# Patient Record
Sex: Male | Born: 1935 | Race: White | Hispanic: No | Marital: Married | Smoking: Never smoker
Health system: Southern US, Community
[De-identification: ages and names within clinical notes are randomized; demographics above are authoritative.]

## PROBLEM LIST (undated history)

## (undated) DIAGNOSIS — G609 Hereditary and idiopathic neuropathy, unspecified: Secondary | ICD-10-CM

## (undated) DIAGNOSIS — R918 Other nonspecific abnormal finding of lung field: Secondary | ICD-10-CM

## (undated) DIAGNOSIS — I10 Essential (primary) hypertension: Secondary | ICD-10-CM

## (undated) DIAGNOSIS — N184 Chronic kidney disease, stage 4 (severe): Secondary | ICD-10-CM

## (undated) DIAGNOSIS — I639 Cerebral infarction, unspecified: Secondary | ICD-10-CM

## (undated) DIAGNOSIS — K7460 Unspecified cirrhosis of liver: Secondary | ICD-10-CM

## (undated) DIAGNOSIS — I350 Nonrheumatic aortic (valve) stenosis: Secondary | ICD-10-CM

## (undated) DIAGNOSIS — I2510 Atherosclerotic heart disease of native coronary artery without angina pectoris: Secondary | ICD-10-CM

---

## 2011-02-06 LAB — CBC
HCT: 27.4 % — ABNORMAL LOW (ref 40.0–52.0)
MCHC: 31.4 g/dL — ABNORMAL LOW (ref 32.0–36.0)
MCV: 71 fL — ABNORMAL LOW (ref 80–100)
Platelet: 318 10*3/uL (ref 150–440)
RDW: 18.1 % — ABNORMAL HIGH (ref 11.5–14.5)
WBC: 6.9 10*3/uL (ref 3.8–10.6)

## 2011-02-06 LAB — COMPREHENSIVE METABOLIC PANEL
Albumin: 3.9 g/dL (ref 3.4–5.0)
Alkaline Phosphatase: 86 U/L (ref 50–136)
Bilirubin,Total: 0.3 mg/dL (ref 0.2–1.0)
Calcium, Total: 9 mg/dL (ref 8.5–10.1)
Co2: 25 mmol/L (ref 21–32)
Creatinine: 2.35 mg/dL — ABNORMAL HIGH (ref 0.60–1.30)
EGFR (Non-African Amer.): 29 — ABNORMAL LOW
Glucose: 122 mg/dL — ABNORMAL HIGH (ref 65–99)
SGPT (ALT): 24 U/L

## 2011-02-06 LAB — TROPONIN I: Troponin-I: 0.28 ng/mL — ABNORMAL HIGH

## 2011-02-07 ENCOUNTER — Observation Stay: Payer: Self-pay | Admitting: Internal Medicine

## 2011-02-07 LAB — RETICULOCYTES
Absolute Retic Count: 0.136 x10 6/uL — ABNORMAL HIGH
Reticulocyte: 3.9 % — ABNORMAL HIGH

## 2011-02-07 LAB — CK TOTAL AND CKMB (NOT AT ARMC)
CK, Total: 81 U/L (ref 35–232)
CK, Total: 101 U/L (ref 35–232)
CK-MB: 2.5 ng/mL (ref 0.5–3.6)
CK-MB: 2.2 ng/mL (ref 0.5–3.6)
CK-MB: 2.7 ng/mL (ref 0.5–3.6)

## 2011-02-07 LAB — TROPONIN I: Troponin-I: 0.28 ng/mL — ABNORMAL HIGH

## 2011-02-07 LAB — PRO B NATRIURETIC PEPTIDE: B-Type Natriuretic Peptide: 2205 pg/mL — ABNORMAL HIGH (ref 0–125)

## 2011-02-07 LAB — FERRITIN: Ferritin (ARMC): <1 ng/mL — ABNORMAL LOW (ref 8–388)

## 2011-02-07 LAB — FOLATE: Folic Acid: 22.8 ng/mL (ref 3.1–100.0)

## 2011-02-08 LAB — BASIC METABOLIC PANEL
BUN: 50 mg/dL — ABNORMAL HIGH (ref 7–18)
Calcium, Total: 8.6 mg/dL (ref 8.5–10.1)
Chloride: 106 mmol/L (ref 98–107)
Creatinine: 3.08 mg/dL — ABNORMAL HIGH (ref 0.60–1.30)
EGFR (Non-African Amer.): 21 — ABNORMAL LOW
Glucose: 99 mg/dL (ref 65–99)
Osmolality: 295 (ref 275–301)

## 2011-02-08 LAB — HEMOGLOBIN A1C: Hemoglobin A1C: 5.3 % (ref 4.2–6.3)

## 2011-02-08 LAB — PROTIME-INR
INR: 1.1
Prothrombin Time: 14.8 secs — ABNORMAL HIGH (ref 11.5–14.7)

## 2011-02-08 LAB — TSH: Thyroid Stimulating Horm: 0.575 u[IU]/mL

## 2011-02-08 LAB — CBC WITH DIFFERENTIAL/PLATELET
Basophil #: 0 10*3/uL (ref 0.0–0.1)
Basophil %: 0.3 %
Eosinophil #: 0.2 10*3/uL (ref 0.0–0.7)
Eosinophil %: 2.3 %
HCT: 23.7 % — ABNORMAL LOW (ref 40.0–52.0)
HGB: 7.5 g/dL — ABNORMAL LOW (ref 13.0–18.0)
Lymphocyte %: 20.7 %
MCH: 22.5 pg — ABNORMAL LOW (ref 26.0–34.0)
MCHC: 31.9 g/dL — ABNORMAL LOW (ref 32.0–36.0)
Neutrophil #: 5.4 10*3/uL (ref 1.4–6.5)
Neutrophil %: 65.6 %
RDW: 17.9 % — ABNORMAL HIGH (ref 11.5–14.5)
WBC: 8.2 10*3/uL (ref 3.8–10.6)

## 2011-02-08 LAB — LIPID PANEL
Ldl Cholesterol, Calc: 87 mg/dL (ref 0–100)
Triglycerides: 130 mg/dL (ref 0–200)

## 2011-02-09 LAB — URINALYSIS, COMPLETE
Bilirubin,UR: NEGATIVE
Blood: NEGATIVE
Glucose,UR: NEGATIVE mg/dL (ref 0–75)
Leukocyte Esterase: NEGATIVE
RBC,UR: <1 /HPF (ref 0–5)
Specific Gravity: 1.012 (ref 1.003–1.030)
WBC UR: 1 /HPF (ref 0–5)

## 2011-02-09 LAB — BASIC METABOLIC PANEL
Anion Gap: 11 (ref 7–16)
BUN: 44 mg/dL — ABNORMAL HIGH (ref 7–18)
Chloride: 108 mmol/L — ABNORMAL HIGH (ref 98–107)
Co2: 23 mmol/L (ref 21–32)
Creatinine: 2.72 mg/dL — ABNORMAL HIGH (ref 0.60–1.30)
EGFR (African American): 30 — ABNORMAL LOW
Glucose: 102 mg/dL — ABNORMAL HIGH (ref 65–99)
Osmolality: 295 (ref 275–301)
Sodium: 142 mmol/L (ref 136–145)

## 2011-02-09 LAB — CBC WITH DIFFERENTIAL/PLATELET
Eosinophil %: 4.3 %
HGB: 8.2 g/dL — ABNORMAL LOW (ref 13.0–18.0)
Lymphocyte #: 2 10*3/uL (ref 1.0–3.6)
Lymphocyte %: 29.4 %
MCH: 22.5 pg — ABNORMAL LOW (ref 26.0–34.0)
MCHC: 31.7 g/dL — ABNORMAL LOW (ref 32.0–36.0)
MCV: 71 fL — ABNORMAL LOW (ref 80–100)
Monocyte %: 10.7 %
Platelet: 242 10*3/uL (ref 150–440)

## 2011-02-09 LAB — PROTEIN / CREATININE RATIO, URINE
Creatinine, Urine: 70.7 mg/dL (ref 30.0–125.0)
Protein, Random Urine: 29 mg/dL — ABNORMAL HIGH (ref 0–12)
Protein/Creat. Ratio: 410 mg/gCREAT — ABNORMAL HIGH (ref 0–200)

## 2013-09-30 ENCOUNTER — Emergency Department: Payer: Self-pay | Admitting: Emergency Medicine

## 2013-09-30 LAB — HEPATIC FUNCTION PANEL A (ARMC)
Albumin: 3.1 g/dL — ABNORMAL LOW (ref 3.4–5.0)
Alkaline Phosphatase: 94 U/L
Bilirubin,Total: 0.4 mg/dL (ref 0.2–1.0)
SGOT(AST): 13 U/L — ABNORMAL LOW (ref 15–37)
SGPT (ALT): 18 U/L
TOTAL PROTEIN: 6.9 g/dL (ref 6.4–8.2)

## 2013-09-30 LAB — BASIC METABOLIC PANEL
ANION GAP: 4 — AB (ref 7–16)
BUN: 45 mg/dL — AB (ref 7–18)
CALCIUM: 8.5 mg/dL (ref 8.5–10.1)
Chloride: 116 mmol/L — ABNORMAL HIGH (ref 98–107)
Co2: 20 mmol/L — ABNORMAL LOW (ref 21–32)
Creatinine: 2.91 mg/dL — ABNORMAL HIGH (ref 0.60–1.30)
EGFR (African American): 23 — ABNORMAL LOW
EGFR (Non-African Amer.): 20 — ABNORMAL LOW
GLUCOSE: 96 mg/dL (ref 65–99)
Osmolality: 291 (ref 275–301)
Potassium: 4.8 mmol/L (ref 3.5–5.1)
Sodium: 140 mmol/L (ref 136–145)

## 2013-09-30 LAB — CBC WITH DIFFERENTIAL/PLATELET
BASOS ABS: 0.1 10*3/uL (ref 0.0–0.1)
Basophil %: 0.6 %
EOS ABS: 0.3 10*3/uL (ref 0.0–0.7)
Eosinophil %: 2.7 %
HCT: 38.7 % — ABNORMAL LOW (ref 40.0–52.0)
HGB: 12.2 g/dL — ABNORMAL LOW (ref 13.0–18.0)
Lymphocyte #: 1.6 10*3/uL (ref 1.0–3.6)
Lymphocyte %: 16.6 %
MCH: 25.3 pg — AB (ref 26.0–34.0)
MCHC: 31.5 g/dL — ABNORMAL LOW (ref 32.0–36.0)
MCV: 80 fL (ref 80–100)
Monocyte #: 0.8 x10 3/mm (ref 0.2–1.0)
Monocyte %: 8.1 %
NEUTROS ABS: 6.9 10*3/uL — AB (ref 1.4–6.5)
Neutrophil %: 72 %
Platelet: 215 10*3/uL (ref 150–440)
RBC: 4.82 10*6/uL (ref 4.40–5.90)
RDW: 18.9 % — ABNORMAL HIGH (ref 11.5–14.5)
WBC: 9.6 10*3/uL (ref 3.8–10.6)

## 2013-09-30 LAB — URINALYSIS, COMPLETE
BLOOD: NEGATIVE
Glucose,UR: NEGATIVE mg/dL (ref 0–75)
Hyaline Cast: 6
KETONE: NEGATIVE
Leukocyte Esterase: NEGATIVE
Nitrite: NEGATIVE
Ph: 5 (ref 4.5–8.0)
RBC,UR: 2 /HPF (ref 0–5)
SQUAMOUS EPITHELIAL: NONE SEEN
Specific Gravity: 1.017 (ref 1.003–1.030)

## 2013-09-30 LAB — TROPONIN I: Troponin-I: <0.02 ng/mL

## 2014-05-09 NOTE — Consult Note (Signed)
Present Illness 79 year old male with no evidence of previous cardiovascular disease with borderline hypertension, hyperlipidemia who is had new onset of significant progressive shortness of breath, weakness with some mild chest pressure with physical activity, relieved by rest.  This is progressive over the last 2 weeks and, dated in very shortness of breath requiring hospitalization.  Upon arrival, the patient does have slight EKG changes but no evidence of acute ST elevation MI.  The patient has had elevated troponin to a minimal degree, more consistent with demand ischemia at this time than true myocardial infarction, although with renal insufficiency, anemia, likely causing some of these symptoms.  The patient has had some pulmonary edema by exam, although chest x-ray does not suggest any infiltrate at this time.  He does have some valvular heart disease which may also contribute to current symptoms.  He's been a significant smoker concerning for cardiovascular disease and may require further intervention and assessment  Family history No family members with early onset cardiovascular disease  Social history Patient currently denies alcohol or tobacco use   Physical Exam:   GEN WD    HEENT pink conjunctivae    NECK No masses    RESP crackles    CARD Regular rate and rhythm    ABD denies tenderness  soft    LYMPH negative neck    EXTR negative cyanosis/clubbing    SKIN No rashes    NEURO cranial nerves intact    PSYCH alert   Review of Systems:   Subjective/Chief Complaint I'm very short of breath    Respiratory: Short of breath    Review of Systems: All other systems were reviewed and found to be negative    Medications/Allergies Reviewed Medications/Allergies reviewed     anxiety:    depression:    arthritis:    HTN:   Home Medications:  tramadol:  orally , Active  Vicodin:  orally , Active  Cardiac:  22-Jan-13 22:47    Troponin I 0.28  Routine Hem:   22-Jan-13 22:47    WBC (CBC) 6.9   RBC (CBC) 3.89   Hemoglobin (CBC) 8.6   Hematocrit (CBC) 27.4   Platelet Count (CBC) 318   MCV 71   MCH 22.1   MCHC 31.4   RDW 18.1  Routine Chem:  22-Jan-13 22:47    Glucose, Serum 122   BUN 35   Creatinine (comp) 2.35   Sodium, Serum 143   Potassium, Serum 4.2   Chloride, Serum 109   CO2, Serum 25   Calcium (Total), Serum 9.0  Hepatic:  22-Jan-13 22:47    Bilirubin, Total 0.3   Alkaline Phosphatase 86   SGPT (ALT) 24   SGOT (AST) 14   Total Protein, Serum 8.0   Albumin, Serum 3.9  Routine Chem:  22-Jan-13 22:47    Osmolality (calc) 294   eGFR (African American) 35   eGFR (Non-African American) 29   Anion Gap 9  Cardiac:  23-Jan-13 07:52    Troponin I 0.28   CK, Total 86   CPK-MB, Serum 2.5    22:47    CK, Total 101   CPK-MB, Serum 2.7  Routine Coag:  23-Jan-13 22:47    D-Dimer, Quantitative 1.26  Routine Chem:  23-Jan-13 22:47    B-Type Natriuretic Peptide ([facility]) 2205    No Known Allergies:   Vital Signs/Nurse's Notes: **Vital Signs.:   23-Jan-13 03:43   Vital Signs Type Admission   Temperature Temperature (F) 98.2   Celsius  36.7   Temperature Source oral   Pulse Pulse 92   Pulse source per Dinamap   Respirations Respirations 18   Systolic BP Systolic BP 211   Diastolic BP (mmHg) Diastolic BP (mmHg) 92   Mean BP 113   BP Source Dinamap   Pulse Ox % Pulse Ox % 98   Pulse Ox Activity Level  At rest   Oxygen Delivery Room Air/ 21 %     Impression 79 year old male with known valvular heart disease hypertension with progressive episodes of shortness of breath, chest pressure with elevated troponin consistent with minimal subendocardial myocardial infarction and acute congestive heart failure, multifactorial in nature including renal insufficiency and anemia    Plan 1.  Continue conservative care for her symptoms without addition of heparin at this time due to concerns of anemia and bleeding. 2.  Echocardiogram  for valvular heart disease, LV dysfunction causing heart failure type symptoms. 3.  Continue investigation of anemia, renal insufficiency, as a primary cause.  Prior to further intervention including cardiac catheterization. 4.  Further diagnostic testing and treatment after improvements of above   Electronic Signatures: Corey Skains (MD)  (Signed 23-Jan-13 12:26)  Authored: General Aspect/Present Illness, History and Physical Exam, Review of System, Past Medical History, Home Medications, Labs, Allergies, Vital Signs/Nurse's Notes, Impression/Plan   Last Updated: 23-Jan-13 12:26 by Corey Skains (MD)

## 2014-05-09 NOTE — Consult Note (Signed)
Pt with multiple medical problems, esp elevated troponin, possible small sub endocardial MI,  CHF, renal insuff, iron def anemia.  Due to severe iron def anemia he should have a GI work up but would prefer to wait a few weeks if at all possible.  He can go on iron therapy in the meantime.  Discussed with him and his wife.  Will follow in patient and schedule office visit for further follow up.  Electronic Signatures: Manya Silvas (MD)  (Signed on 24-Jan-13 17:10)  Authored  Last Updated: 24-Jan-13 17:10 by Manya Silvas (MD)

## 2014-05-09 NOTE — Consult Note (Signed)
Brief Consult Note: Diagnosis: Symptomatic anemia, Chronic renal disease, absence of any GI c/o, no prior colon/EGD.   Patient was seen by consultant.   Consult note dictated.   Comments: Patient wants to go home tomorrow and f/u in the office. I explained in detail why a luminal evaluation is important to look for hidden blood loss from causes like ulcers, erosions, malignancy. Patient and wife listened, voiced understanding and agree to f/u in the office to set up outpatient EGD/colonoscopy.  He knows to turn is stool sample to see if he is passing blood and would advise hemoccults for home. Continue on PPI. He will need to have the discharging  MD  arrange ov with [place]. Case d/w Dr. . Vira Agar in collaboration of care.  Electronic Signatures: Gershon Mussel (NP)  (Signed 24-Jan-13 15:57)  Authored: Brief Consult Note   Last Updated: 24-Jan-13 15:57 by Gershon Mussel (NP)

## 2014-05-09 NOTE — Discharge Summary (Signed)
PATIENT NAME:  [name], [name] MR#:  [id number] DATE OF BIRTH:  10-25-1935  DATE OF ADMISSION:  02/07/2011 DATE OF DISCHARGE:  02/09/2011  DISCHARGE DIAGNOSES:  1. Chronic obstructive pulmonary disease flare. 2. Symptomatic iron deficiency anemia.  3. Hypertensive urgency.  4. Stage III renal insufficiency.  5. Polycystic kidney disease.  6. History of gastritis with gastrointestinal bleed secondary to nonsteroidals.  7. Osteoarthritis.   DISCHARGE MEDICATIONS:  1. Tramadol 50 mg t.i.d. p.r.n. pain.  2. Omeprazole 20 mg b.i.d.  3. Vitron C iron pill daily.  4. Metoprolol tartrate 25 mg b.i.d.  5. Prednisone taper.   REASON FOR ADMISSION: The patient is a 79 year old male who presented with shortness of breath, severe anemia, and worsening renal function. Please see history and physical for history of present illness, past medical history, and physical exam.   HOSPITAL COURSE: The patient was admitted. Cardiac enzymes were essentially negative. Echo showed EF 55%, moderate MR. Abdominal CT was done with his anemia which showed renal cysts and follow-up ultrasound showed the renal cyst and no sign of malignancy. SPEP negative. Iron studies were profoundly low. Dr. Vira Agar was consulted from GI but thought that him being more stable in the outpatient setting would be wise. The likelihood of his GI blood loss is probably upper from past history. Omeprazole will taken b.i.d. and iron added. His renal function did improve with slow hydration. It is likely due to his years of hypertension, possible peripheral vascular disease, and his bilateral cystic disease. His blood pressure was controlled with metoprolol well. Lisinopril and Lasix were stopped. He will be getting prednisone for his bronchospasm with his chronic obstructive pulmonary disease.   FOLLOW-UP: Follow-up with Dr. Sabra Heck in one week.   OVERALL PROGNOSIS: Good.   ____________________________ Rusty Aus, MD mfm:drc D: 02/09/2011  07:58:46 ET T: 02/09/2011 11:42:28 ET JOB#: [id number]  cc: Rusty Aus, MD, <Dictator> Hutson Luft Roselee Culver MD ELECTRONICALLY SIGNED 02/16/2011 8:10

## 2014-05-09 NOTE — H&P (Signed)
PATIENT NAME:  [name], MANGAT MR#:  [id number] DATE OF BIRTH:  [birth year]  DATE OF ADMISSION:  02/07/2011  REFERRING PHYSICIAN: Dr. Jasmine December PRIMARY CARE PHYSICIAN: Dr. Emily Filbert  PRESENTING COMPLAINT: Shortness of breath on exertion, wheezing, restlessness and fatigue.   HISTORY OF PRESENT ILLNESS: Mr. Durel is a 79 year old gentleman with history of hypertension, osteoarthritis, asthma, chronic obstructive pulmonary disease, chronic kidney disease, depression, gastritis who presents with reports of developing shortness of breath 2 to 3 weeks ago immediately after burning wood in the forest. Since then he has been dyspneic on exertion. His symptoms have not improved. He was seen in [facility] on 01/19/2011, treated for chronic obstructive pulmonary disease exacerbation. Reports that his symptoms have not improved with Advair and actually has not been taking the medication. He reports for the past couple of nights he has been restless. He wakes up more fatigued. He has been having persistent wheezing also for the past 2 to 3 weeks. He endorses a couple of episodes of palpitations also within the past 2 to 3 weeks. No syncope. Denies any chest pain. Reports some dyspepsia today and reported transient blurry vision that resolved after 2 to 3 hours x1 in the past few weeks. He denies any fevers or chills. No cough. No sick contact. Denies similar episodes in the past. Reports that he is restarted back on his blood pressure since his blood pressure has been consistently elevated. His regimen was discontinued due to worsening renal function.   PAST MEDICAL HISTORY:  1. Hypertension.  2. Osteoarthritis.  3. Chronic kidney disease, baseline creatinine around 2.  4. Gastritis and GI bleed secondary NSAIDs.  5. Depression.  6. Asthma/chronic obstructive pulmonary disease.  7. History of left ankle fracture without surgery.   PAST SURGICAL HISTORY: None.   ALLERGIES: NSAIDs.   MEDICATIONS:   1. Hydrocodone/acetaminophen 5/500, 1 tablet b.i.d. as needed.  2. Tramadol 50 mg 2 tablets q.i.d. as needed.  3. Flexeril 10 mg b.i.d. as needed.  4. Citalopram 20 mg daily.  5. Lisinopril/HCTZ 10/12.5 mg every other day.  6. Dulcolax 10 mg b.i.d. as needed.  7. Omeprazole 20 mg daily.  8. Lasix 20 mg daily as needed.   SOCIAL HISTORY: He lives in [place] with his wife. Quit tobacco greater than 15 years ago. No alcohol or drug use.   FAMILY HISTORY: Mother had kidney problems. Father died of a myocardial infarction in his 23s.   REVIEW OF SYSTEMS: CONSTITUTIONAL: No fevers, chills. EYES: Reports blurry vision as per history of present illness. ENT: No dysphagia, epistaxis, discharge. RESPIRATORY: As per history of present illness. CARDIOVASCULAR: As per history of present illness. He also reports increase in his feet swelling from baseline for the past two weeks. GASTROINTESTINAL: No nausea, vomiting, diarrhea. He reports dyspepsia today. No constipation, bloody stool or melena. GENITOURINARY: No dysuria, hematuria. ENDO: No polyuria or polydipsia. HEMATOLOGIC: No easy bleeding. SKIN: No ulcers. MUSCULOSKELETAL: He has arthritis pain. NEUROLOGIC: No one-sided weakness or numbness. PSYCH: Denies any suicidal ideation. He endorses depression.   PHYSICAL EXAMINATION:  VITAL SIGNS: Temperature 98.3, pulse 107, current pulse 80s to 90s, respiratory rate 22, blood pressure 170/91, sating 100% on room air.   GENERAL: Lying in bed in no apparent distress.   HEENT: Normocephalic, atraumatic. Pupils equal, symmetric. Nares without discharge. Moist mucous membrane.   NECK: Soft and supple. No adenopathy or JVP.   CARDIOVASCULAR: Non-tachy. No murmurs, rubs, or gallops.   LUNGS: Basilar crackles, right side greater than  left. No wheezing appreciated. No use of accessory muscles or increased respiratory effort.   ABDOMEN: Soft. Positive bowel sounds. Nontender on examination. No mass appreciated.    EXTREMITIES: No edema. Dorsal pedis pulses intact.   MUSCULOSKELETAL: No joint effusion.   SKIN: No ulcers.   NEUROLOGIC: Symmetrical strength. No focal deficits.   PSYCH: He is alert and oriented. Patient is cooperative.   LABORATORY, DIAGNOSTIC AND RADIOLOGICAL DATA: D-dimer 1.26, BNP 2205. CK 101, MB 2.7. Troponin 0.28. WBC 6.9, hemoglobin 8.6, hematocrit 27.4, platelets 318, MCV 71, glucose 122, BUN 35, creatinine 2.35, sodium 143, potassium 4.2, chloride 109, carbon dioxide 25, calcium 9.0. LFTs within normal limits. Total protein 8.0. EKG with sinus tachycardia of 102. No ST elevation or depression. Chest x-ray without evidence of edema or infiltrate. Official chest x-ray reading is pending.   ASSESSMENT AND PLAN: Mr. Zawistowski is a 79 year old man with history of asthma, chronic obstructive pulmonary disease, hypertension, osteoarthritis, chronic kidney disease, gastritis in the setting of NSAIDs presenting with exertional shortness of breath, fatigue and wheezing.  1. Presumed angina. Dyspnea on exertion could be anginal equivalent plus/minus underlying chronic obstructive pulmonary disease, although elevated troponin could be in the setting of chronic kidney disease, questionable also underlying obstructive sleep apnea based on his history. Denies any chest pain during his entire symptoms. Will start enteric-coated aspirin, nitroglycerin sublingual as needed. Oxygen. Start on nitro patch. Monitor respiratory status with initiation of beta blocker. Hold off on heparin drip given hemoglobin of 8.6 which has dropped compared to January 2012 which was 11.3. Will start on p.o. steroid, SVNs and Spiriva. Cycle cardiac enzymes. Continue on telemonitor. Send TSH, fasting lipid panel, A1c for risk stratification. Obtain cardiology consultation. Obtain echocardiogram. Hold off on additional diagnostics until evaluated by cardiology. D-dimer is elevated. Will order for a V/Q scan.  2. Hypertension,  uncontrolled. Will hold lisinopril HCTZ as above. Initiate on beta blocker.  3. Acute on chronic anemia with history of gastritis and GI bleed. Holding on heparin drip as above. As above b.i.d. PPI. Send occult stool.  4. Chronic kidney disease, baseline creatinine around 2, presenting creatinine around 2.35. Consider restarting lisinopril if creatinine remains stable. Hold off on Lasix.  5. Prophylaxis with TEDs, SCDs, aspirin and omeprazole.   TIME SPENT: Approximately 50 minutes spent on patient care.    ____________________________ Rita Ohara, MD ap:cms D: 02/07/2011 02:58:38 ET T: 02/07/2011 06:44:56 ET JOB#: [id number]  cc: Brien Few Zully Frane, MD, <Dictator> Rusty Aus, MD Rita Ohara MD ELECTRONICALLY SIGNED 03/13/2011 0:31

## 2014-05-09 NOTE — Consult Note (Signed)
PATIENT NAME:  [name], [name] MR#:  [id number] DATE OF BIRTH:  05-25-1935  DATE OF CONSULTATION:  02/08/2011  REFERRING PHYSICIAN:  Emily Filbert, MD CONSULTING PHYSICIAN:  Jahkeem Kurka Lilian Kapur, MD  REASON FOR CONSULTATION: Acute renal failure in the setting of chronic kidney disease, stage III.   HISTORY OF PRESENT ILLNESS: The patient is a very pleasant 79 year old Caucasian male with past medical history of hypertension, osteoarthritis, chronic kidney disease stage III, gastritis and GI bleed secondary to NSAIDs, depression, chronic obstructive pulmonary disease, and history of left ankle fracture who presented to [facility] on 02/07/2011 with complaints of shortness of breath and wheezing. He reports to me that several weeks ago he installed a wood burning stove.  He subsequently started developing significant amounts of shortness of breath. It was felt that chemicals that were burning within the wood stove were causing his shortness of breath. He subsequently had the wood stove removed; however, his shortness of breath persisted. He appears to have underlying chronic obstructive pulmonary disease. He is currently being treated with supplemental oxygen. He is also receiving Spiriva. He was also found to be significantly anemic and the patient is to receive blood transfusion this morning. The patient had a V/Q scan that was low probability for pulmonary embolism. He also had a chest x-ray which did not demonstrate any acute cardiopulmonary disease. The patient also had an abdominal CT which demonstrated polycystic kidneys. We are consulted for the evaluation and management of acute renal failure in the setting of known chronic kidney disease. His baseline creatinine is reported as being in the 2s. His creatinine upon admission was 2.35. His creatinine now is 3.08. Currently, there is no urinalysis to review. The patient reports that he had significantly diminished appetite and p.o. fluid  intake 2 to 3 days prior to admission. He was noted to be on lisinopril/hydrochlorothiazide at home and reports that he continued to take this while he was not eating all that well.   PAST MEDICAL HISTORY:  1. Hypertension.  2. Osteoarthritis.  3. Chronic kidney disease stage III with baseline creatinine 2.  4. History of gastritis and gastrointestinal bleed secondary to NSAIDs.  5. Chronic obstructive pulmonary disease.  6. History of left ankle fracture without surgery.  7. Depression.   PAST SURGICAL HISTORY: None.   ALLERGIES: NSAIDs.  CURRENT INPATIENT MEDICATIONS: 1. Tylenol 650 mg p.o. every 4 hours p.r.n.  2. Norco 5/325 mg 1 to 2 tablets p.o. every four hours p.r.n. 3. Aspirin 81 mg daily.  4. Metoprolol 25 mg twice a day. 5. Nitroglycerin 0.4 mg sublingual every 5 minutes p.r.n. chest pain.  6. Omeprazole 40 mg p.o. twice a day. 7. Zofran 4 mg p.o. every six hours p.r.n.  8. Tiotropium 1 capsule inhaled daily.   SOCIAL HISTORY: The patient reports that he lives close to [place]. He is married. He has two adult children. He reports he quit smoking tobacco 20 years ago, but he continues to chew tobacco at this time. He denies alcohol or illicit drug use.   FAMILY HISTORY: Mother is deceased and had history of end-stage renal disease and was on dialysis apparently. The etiology of her kidney failure is currently unclear. The patient's father died secondary to myocardial infarction.  REVIEW OF SYSTEMS: CONSTITUTIONAL: The patient denies fevers or chills. He does have some fatigue. EYES: Denies diplopia and blurry vision. HEENT: Denies headaches, hearing loss, tinnitus, epistaxis, or sore throat. PULMONARY: Endorses shortness of breath that has been rather  progressive in nature. Denies hemoptysis. CARDIOVASCULAR: Denies chest pain, palpitations, PND, or orthopnea. GI: Denies nausea, vomiting, or diarrhea. Does report poor appetite. GU: Denies frequency, urgency, or dysuria.  NEUROLOGIC: Denies focal extremity numbness, weakness or tingling. MUSCULOSKELETAL: Denies joint swelling. He does have osteoarthritis, particularly of the hands. INTEGUMENTARY: Denies skin rashes or lesions. ENDOCRINE: Denies polyuria, polydipsia, or polyphagia. HEMATOLOGIC/LYMPHATIC: Denies easy bruisability, bleeding, or swollen lymph nodes. ALLERGY/IMMUNOLOGIC: Denies seasonal allergies or history of immunodeficiency. PSYCHIATRIC: Has history of depression.      PHYSICAL EXAMINATION:   VITAL SIGNS: Temperature 96.2, pulse 80, respirations 18, blood pressure 131/80, and pulse oximetry 97% on room air.   GENERAL: Slender, Caucasian male who appears his stated age, currently in no acute distress.   HEENT: Normocephalic, atraumatic. Extraocular movements are intact. Pupils are equal, round, and reactive to light. No scleral icterus. Conjunctivae are pale. No epistaxis noted. Gross hearing intact. Oral mucosa moist.   NECK: Supple without JVD or lymphadenopathy.   LUNGS: Currently clear to auscultation bilaterally. Normal respiratory effort noted.   HEART:  S1 and S2 regular rate and rhythm. No murmurs, rubs, or gallops appreciated.   ABDOMEN: Soft, nontender, and nondistended. Bowel sounds positive. No rebound or guarding. No gross organomegaly appreciated.   EXTREMITIES: No clubbing, cyanosis, or edema.   NEUROLOGIC: The patient is alert and oriented to time, person, and place.   SKIN: Warm and dry. No rashes noted. Varicose veins noted in his left lower extremity.   MUSCULOSKELETAL: No joint redness, swelling, or tenderness is appreciated.   PSYCHIATRIC: The patient has an appropriate affect and appears to have good insight into his current illness.   LABS/STUDIES: Sodium 141, potassium 4.3, chloride 106, CO2 23, BUN 50, creatinine 3.08, glucose 99. Hemoglobin A1c 5.3. TSH 0.575. CBC shows WBC 8.2, hemoglobin 7.5, hematocrit 23.7, platelets 284, and MCV is quite low at 71. INR 1.1.  D-dimer 1.26,. Vitamin B12 454. Serum erythropoietin 24.4. Serum protein electrophoresis is pending.  2-D echocardiogram shows normal ejection fraction of 50%, moderate concentric left ventricular hypertrophy, moderate mitral regurgitation, and mild to moderate tricuspid regurgitation.   IMPRESSION/RECOMMENDATIONS: This is a 79 year old Caucasian male with past medical history of hypertension, osteoarthritis, chronic kidney disease stage III, gastritis, gastrointestinal bleed secondary to NSAIDs, chronic obstructive pulmonary disease, history of left ankle fracture without surgery, and depression who was admitted to [facility] with complaints of worsening shortness of breath.  1. Acute renal failure: I suspect that this was due to prolonged volume depletion and the patient most likely has acute tubular necrosis now. He was on lisinopril/hydrochlorothiazide and he was also taking Lasix at home. These have been appropriately held. We will start gentle IV fluid hydration with normal saline at 50 mL per hour. The patient is also to receive packed red blood cells today which should help to reestablish circulating blood volume.  2. Chronic kidney disease, stage III/bilateral multiple renal cysts: It is interesting to note that the patient has a family history of underlying endstage renal disease. The patient's mother had kidney failure; however, the etiology of her kidney failure was not known. The patient has multiple bilateral renal cysts which raises the possibility of polycystic kidney disease. Baseline creatinine is noted to be in the 2. We plan to obtain a renal ultrasound for further evaluation of these renal cysts. Agree with holding ACE inhibitor at this point in time.  3. Microcytic anemia: MCV was noted to be quite low at 71. Ideally we would like  to check iron studies, however, the patient is about to receive blood at this point in time, therefore, it is not recommended that iron  be checked. SPEP and UPEP are being checked at present. We will await these studies. I agree with blood transfusion at this point in time.  4. Shortness of breath: This appears to be multifactorial. He does appear to have underlying chronic obstructive pulmonary disease and has a history of long-standing tobacco abuse in the past. An addition he is noted to be significantly anemic which is likely contributing to his shortness of breath. Hopefully this can be reevaluated once he has received a blood transfusion.   I would like to thank Dr. Emily Filbert for this kind referral. Further plan as the patient progresses.  ____________________________ Tama High, MD mnl:slb D: 02/08/2011 10:45:43 ET     T: 02/08/2011 11:01:06 ET        JOB#: [id number] cc: Tama High, MD, <Dictator> Mariah Milling Mariyana Fulop MD ELECTRONICALLY SIGNED 02/23/2011 20:45

## 2014-05-09 NOTE — Consult Note (Signed)
PATIENT NAME:  [name], [name] MR#:  [id number] DATE OF BIRTH:  [birth year]  DATE OF CONSULTATION:  02/08/2011  REFERRING PHYSICIAN:  Dr. Emily Filbert  CONSULTING PHYSICIAN:  Janalyn Harder. Jerelene Redden, ANP/Dr. Gaylyn Cheers   REASON FOR CONSULTATION: GI blood loss, evaluate for need for endoscopy.   HISTORY OF PRESENT ILLNESS: This 79 year old patient of Dr. Emily Filbert has known hypertension, chronic kidney disease, previous gastritis, depression, chronic obstructive pulmonary disease with chronic tobacco use now chewing tobacco. 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 Patient has noted for the last three weeks worsening dyspnea on exertion, a little cough but no fever, sputum production or signs there is infection. In addition, he has had some fatigue, and palpitations. Patient presented with a hemoglobin of 8.6, and per chart review he ran a hemoglobin of 13.5 in 2011, and 11.3 in 2012 with a normal MCV. Stool cards are pending and he has declined a rectal exam. Patient denies any upper or lower GI complaints. No prior EGD or colonoscopy. Patient denies any history of heartburn or reflux except if he eats onions late at night. He denies any history of gastritis, ulcers or NSAID use.   PAST MEDICAL HISTORY:  1. Hypertension.  2. Osteoarthritis.  3. Chronic kidney disease, stage III.  4. History of gastritis per chart review, patient denies.  5. History of depression.  6. Asthma, chronic obstructive pulmonary disease.  7. History of left ankle fracture.   PAST SURGICAL HISTORY: Denied.   MEDICATIONS ON ARRIVAL:  1. Tramadol 50 mg 2 tablets q.i.d. as needed.  2. Flexeril 10 mg as needed.  3. Citalopram 20 mg daily.  4. Lisinopril/HCTZ 10/12.5 mg every other day.  5. Dulcolax 10 mg b.i.d. as needed.  6. Omeprazole 20 mg daily.  7. Lasix 20 mg daily as needed.  8. Patient denies NSAID use.   ALLERGIES: NSAIDs.   HABITS: Positive tobacco, quit 15 years ago, picked  up chewing tobacco. Chewing tobacco is in his mouth now. No alcohol or drug use. 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   FAMILY HISTORY: Mother with kidney problems. Father deceased with myocardial infarction in his 23s. To their knowledge no colon cancer.   REVIEW OF SYSTEMS: 10 systems reviewed, positive as above. Patient states he feels well now, no specific complaints. He does acknowledge that his kidneys are being looked at closely and he is interested in follow up with renal doctor on discharge.   PHYSICAL EXAMINATION:  VITAL SIGNS: Temperature 97.6, pulse 80, respirations 18, blood pressure 136/82, pulse oximetry on room air is 97%.   GENERAL: Elderly Caucasian male, well nourished in no acute distress.   HEENT: Head is normocephalic. Conjunctivae pink. Sclerae anicteric. Oral mucosa is moist and intact.   NECK: Supple without lymphadenopathy, thyromegaly.   HEART: Heart tones S1 and S2 without murmur.   LUNGS: Fully clear to auscultation. Respirations are eupneic.   ABDOMEN: Soft, nontender, normal bowel sounds without hepatosplenomegaly. No tenderness. Patient refuses rectal exam.   EXTREMITIES: Lower extremities with marked varicose veins on the left, no edema, skin color is a little pale. No rash.   MUSCULOSKELETAL: Patient assists with position changes. Strength equal bilateral. Gait is not examined.   PSYCH: Affect and mood within normal. Patient is pleasant, interacting appropriately.   LABORATORY, DIAGNOSTIC, AND RADIOLOGICAL DATA: Admission blood work 02/06/2011 with glucose 122, BUN 35, creatinine 2.35, sodium 143,  CO2 25. Liver panel unremarkable. Troponin 0.28. CPK 86, MB 2.5, WBC 6.9, hemoglobin 8.6, platelet count 318, MCV 71 with MCH 22.1, RDW 18.1. Folic acid Q000111Q.   BNP 2205.   02/07/2011: D-dimer 1.26. Protein electrophoresis with M spike 293. B12 454, serum erythropoietin 24.4.   Repeat laboratory  studies 02/08/2011: BUN 50, creatinine 3.08, sodium 141, potassium 4.3, estimated GFR 21%, magnesium 2.1, triglycerides 130, cholesterol 136, ferritin less than 1. Hemoglobin A1c 5.3. Phosphorus 3.4, calcium 8.6. Pro time 14.8, INR 1.1, magnesium 2.1. TSH 0.575.  Hemoglobin 7.5. He has been typed and crossed for 1 unit PRBC.   Admission chest x-ray PA and lateral showed no acute cardiopulmonary disease.   CT of the abdomen and pelvis without contrast performed 02/07/2011 showed multiple bilateral renal cysts, polycystic renal disease cannot be excluded. Tiny 2.5 cm infrarenal suprailiac abdominal aortic aneurysm with extension into the common iliacs.   Lung V/Q scan performed 02/07/2011: Low probability for pulmonary embolism.   Renal ultrasound performed 02/08/2011 showing multiple bilateral renal cysts. Prostate enlargement is present.   IMPRESSION:  1. Patient presents with profound iron deficiency anemia, no documentation of GI blood loss. He does have worsening renal function. Non-GI origin for anemia is quite possible. Unfortunately, patient refuses digital rectal exam, has not had a stool for Hemoccult testing as of yet, has had no prior history of colonoscopy. There is mention in the chart of gastritis, patient presents on omeprazole but denies such. He does have history of chronic tobacco use. No specific GI complaints and tolerated a normal diet today without any problems. Patient is getting worked up for renal disease, cysts, and transfused 1 unit PRBC. He would like to go home tomorrow. Patient states he is interested in getting the renal problem looked at first and then he will consider outpatient evaluation for his anemia.  2. I reviewed in great detail the EGD, colonoscopy and the rationale for GI evaluation for profound anemia. I told him we would be looking for gastritis, ulcers, erosions and malignancy. I told him it was possible to have occult cancers that are not visible by the  naked eye or even with symptoms and sometimes we find it with anemia. He and his wife voice understanding and report they will come in for outpatient evaluation. Patient has not had improvement in hemoglobin as of yet, and he is quite certain he is going home tomorrow. I will discuss case further with Dr. Vira Agar in collaboration of care.   These services provided by Joelene Millin A. Jerelene Redden, MS, APRN, BC, ANP under collaborative agreement with Dr. Gaylyn Cheers.   Case was discussed with Dr. Vira Agar in collaboration of care and he will talk with patient this afternoon.  ____________________________ Janalyn Harder Jerelene Redden, ANP kam:cms D: 02/08/2011 16:13:47 ET T: 02/09/2011 06:45:52 ET JOB#: [id number]  cc: Joelene Millin A. Jerelene Redden, ANP, <Dictator> Rusty Aus, MD Janalyn Harder. Sherlyn Hay, MSN, ANP-BC Adult Nurse Practitioner ELECTRONICALLY SIGNED 02/09/2011 9:09

## 2015-08-04 ENCOUNTER — Encounter
Admission: RE | Disposition: A | Discharge status: Home / Self Care | Payer: Self-pay | Source: Ambulatory Visit | Attending: Internal Medicine

## 2015-08-04 ENCOUNTER — Ambulatory Visit
Admission: RE | Admit: 2015-08-04 | Discharge: 2015-08-04 | Disposition: A | Discharge status: Home / Self Care | Payer: Medicare Other | Source: Ambulatory Visit | Attending: Internal Medicine | Admitting: Internal Medicine

## 2015-08-04 DIAGNOSIS — Z886 Allergy status to analgesic agent status: Secondary | ICD-10-CM | POA: Diagnosis not present

## 2015-08-04 DIAGNOSIS — M19041 Primary osteoarthritis, right hand: Secondary | ICD-10-CM | POA: Diagnosis not present

## 2015-08-04 DIAGNOSIS — Z888 Allergy status to other drugs, medicaments and biological substances status: Secondary | ICD-10-CM | POA: Diagnosis not present

## 2015-08-04 DIAGNOSIS — E669 Obesity, unspecified: Secondary | ICD-10-CM | POA: Insufficient documentation

## 2015-08-04 DIAGNOSIS — M19042 Primary osteoarthritis, left hand: Secondary | ICD-10-CM | POA: Diagnosis not present

## 2015-08-04 DIAGNOSIS — Z8249 Family history of ischemic heart disease and other diseases of the circulatory system: Secondary | ICD-10-CM | POA: Insufficient documentation

## 2015-08-04 DIAGNOSIS — Z791 Long term (current) use of non-steroidal anti-inflammatories (NSAID): Secondary | ICD-10-CM | POA: Diagnosis not present

## 2015-08-04 DIAGNOSIS — I350 Nonrheumatic aortic (valve) stenosis: Secondary | ICD-10-CM | POA: Diagnosis not present

## 2015-08-04 DIAGNOSIS — J449 Chronic obstructive pulmonary disease, unspecified: Secondary | ICD-10-CM | POA: Diagnosis not present

## 2015-08-04 DIAGNOSIS — M479 Spondylosis, unspecified: Secondary | ICD-10-CM | POA: Diagnosis not present

## 2015-08-04 DIAGNOSIS — Z87891 Personal history of nicotine dependence: Secondary | ICD-10-CM | POA: Diagnosis not present

## 2015-08-04 DIAGNOSIS — M109 Gout, unspecified: Secondary | ICD-10-CM | POA: Diagnosis not present

## 2015-08-04 DIAGNOSIS — R0602 Shortness of breath: Secondary | ICD-10-CM | POA: Diagnosis present

## 2015-08-04 DIAGNOSIS — Z841 Family history of disorders of kidney and ureter: Secondary | ICD-10-CM | POA: Diagnosis not present

## 2015-08-04 DIAGNOSIS — I25110 Atherosclerotic heart disease of native coronary artery with unstable angina pectoris: Secondary | ICD-10-CM | POA: Insufficient documentation

## 2015-08-04 DIAGNOSIS — N183 Chronic kidney disease, stage 3 (moderate): Secondary | ICD-10-CM | POA: Diagnosis not present

## 2015-08-04 DIAGNOSIS — M19012 Primary osteoarthritis, left shoulder: Secondary | ICD-10-CM | POA: Insufficient documentation

## 2015-08-04 DIAGNOSIS — Z6825 Body mass index (BMI) 25.0-25.9, adult: Secondary | ICD-10-CM | POA: Diagnosis not present

## 2015-08-04 DIAGNOSIS — I200 Unstable angina: Secondary | ICD-10-CM | POA: Diagnosis present

## 2015-08-04 DIAGNOSIS — G609 Hereditary and idiopathic neuropathy, unspecified: Secondary | ICD-10-CM | POA: Insufficient documentation

## 2015-08-04 DIAGNOSIS — Z79899 Other long term (current) drug therapy: Secondary | ICD-10-CM | POA: Insufficient documentation

## 2015-08-04 DIAGNOSIS — I129 Hypertensive chronic kidney disease with stage 1 through stage 4 chronic kidney disease, or unspecified chronic kidney disease: Secondary | ICD-10-CM | POA: Insufficient documentation

## 2015-08-04 HISTORY — PX: CARDIAC CATHETERIZATION: SHX172

## 2015-08-04 SURGERY — RIGHT/LEFT HEART CATH AND CORONARY ANGIOGRAPHY
Anesthesia: Moderate Sedation

## 2015-08-04 MED ORDER — SODIUM CHLORIDE 0.9 % IV SOLN: 250.0000 mL | INTRAVENOUS | Status: DC | PRN

## 2015-08-04 MED ORDER — SODIUM CHLORIDE 0.9% FLUSH
3.0000 mL | INTRAVENOUS | Status: DC | PRN
Start: 2015-08-04 — End: 2015-08-04

## 2015-08-04 MED ORDER — ONDANSETRON HCL 4 MG/2ML IJ SOLN: 4.0000 mg | Freq: Four times a day (QID) | INTRAMUSCULAR | Status: DC | PRN

## 2015-08-04 MED ORDER — SODIUM CHLORIDE 0.9% FLUSH: 3.0000 mL | INTRAVENOUS | Status: DC | PRN

## 2015-08-04 MED ORDER — HEPARIN (PORCINE) IN NACL 2-0.9 UNIT/ML-% IJ SOLN
INTRAMUSCULAR | Status: AC
  Filled 2015-08-04: qty 500

## 2015-08-04 MED ORDER — ACETAMINOPHEN 325 MG PO TABS: 650.0000 mg | ORAL_TABLET | ORAL | Status: DC | PRN

## 2015-08-04 MED ORDER — FENTANYL CITRATE (PF) 100 MCG/2ML IJ SOLN
INTRAMUSCULAR | Status: DC | PRN
  Administered 2015-08-04 (×2): 25 ug via INTRAVENOUS

## 2015-08-04 MED ORDER — SODIUM CHLORIDE 0.9 % IV SOLN
INTRAVENOUS | Status: DC
  Administered 2015-08-04: 12:00:00 via INTRAVENOUS

## 2015-08-04 MED ORDER — MIDAZOLAM HCL 2 MG/2ML IJ SOLN
INTRAMUSCULAR | Status: DC | PRN
  Administered 2015-08-04 (×2): 1 mg via INTRAVENOUS

## 2015-08-04 MED ORDER — ASPIRIN 81 MG PO CHEW: 81.0000 mg | CHEWABLE_TABLET | ORAL | Status: DC

## 2015-08-04 MED ORDER — SODIUM CHLORIDE 0.9 % WEIGHT BASED INFUSION: 3.0000 mL/kg/h | INTRAVENOUS | Status: DC

## 2015-08-04 MED ORDER — FENTANYL CITRATE (PF) 100 MCG/2ML IJ SOLN
INTRAMUSCULAR | Status: AC
  Filled 2015-08-04: qty 2

## 2015-08-04 MED ORDER — SODIUM CHLORIDE 0.9% FLUSH: 3.0000 mL | Freq: Two times a day (BID) | INTRAVENOUS | Status: DC

## 2015-08-04 MED ORDER — IOPAMIDOL (ISOVUE-300) INJECTION 61%
INTRAVENOUS | Status: DC | PRN
  Administered 2015-08-04: 140 mL via INTRA_ARTERIAL

## 2015-08-04 MED ORDER — MIDAZOLAM HCL 2 MG/2ML IJ SOLN
INTRAMUSCULAR | Status: AC
  Filled 2015-08-04: qty 2

## 2015-08-04 SURGICAL SUPPLY — 13 items
CATH INFINITI 5FR ANG PIGTAIL (CATHETERS) ×3 IMPLANT
CATH INFINITI 5FR JL4 (CATHETERS) ×3 IMPLANT
CATH INFINITI JR4 5F (CATHETERS) ×3 IMPLANT
CATH SWANZ 7F THERMO (CATHETERS) ×3 IMPLANT
DEVICE CLOSURE MYNXGRIP 5F (Vascular Products) ×3 IMPLANT
GUIDEWIRE EMER 3M J .025X150CM (WIRE) ×3 IMPLANT
KIT MANI 3VAL PERCEP (MISCELLANEOUS) ×3 IMPLANT
KIT RIGHT HEART (MISCELLANEOUS) ×3 IMPLANT
NEEDLE PERC 18GX7CM (NEEDLE) ×3 IMPLANT
PACK CARDIAC CATH (CUSTOM PROCEDURE TRAY) ×3 IMPLANT
SHEATH AVANTI 5FR X 11CM (SHEATH) ×3 IMPLANT
SHEATH PINNACLE 7F 10CM (SHEATH) ×3 IMPLANT
WIRE EMERALD 3MM-J .035X150CM (WIRE) ×3 IMPLANT

## 2015-08-05 ENCOUNTER — Encounter: Payer: Self-pay | Admitting: Internal Medicine

## 2016-05-01 ENCOUNTER — Other Ambulatory Visit: Payer: Self-pay | Admitting: Specialist

## 2016-05-01 DIAGNOSIS — R918 Other nonspecific abnormal finding of lung field: Secondary | ICD-10-CM

## 2016-05-09 ENCOUNTER — Ambulatory Visit
Admission: RE | Admit: 2016-05-09 | Discharge: 2016-05-09 | Disposition: A | Discharge status: Home / Self Care | Payer: Medicare Other | Source: Ambulatory Visit | Attending: Specialist | Admitting: Specialist

## 2016-05-09 DIAGNOSIS — I77810 Thoracic aortic ectasia: Secondary | ICD-10-CM | POA: Diagnosis not present

## 2016-05-09 DIAGNOSIS — I2510 Atherosclerotic heart disease of native coronary artery without angina pectoris: Secondary | ICD-10-CM | POA: Insufficient documentation

## 2016-05-09 DIAGNOSIS — I358 Other nonrheumatic aortic valve disorders: Secondary | ICD-10-CM | POA: Insufficient documentation

## 2016-05-09 DIAGNOSIS — I700 Atherosclerosis of aorta: Secondary | ICD-10-CM | POA: Insufficient documentation

## 2016-05-09 DIAGNOSIS — I288 Other diseases of pulmonary vessels: Secondary | ICD-10-CM | POA: Diagnosis not present

## 2016-05-09 DIAGNOSIS — R918 Other nonspecific abnormal finding of lung field: Secondary | ICD-10-CM | POA: Insufficient documentation

## 2016-05-14 ENCOUNTER — Other Ambulatory Visit: Payer: Self-pay | Admitting: Specialist

## 2016-05-14 ENCOUNTER — Ambulatory Visit
Admission: RE | Admit: 2016-05-14 | Discharge: 2016-05-14 | Disposition: A | Discharge status: Home / Self Care | Payer: Self-pay | Source: Ambulatory Visit | Attending: Specialist | Admitting: Specialist

## 2016-05-14 DIAGNOSIS — J449 Chronic obstructive pulmonary disease, unspecified: Secondary | ICD-10-CM

## 2016-05-16 ENCOUNTER — Other Ambulatory Visit: Payer: Self-pay | Admitting: Specialist

## 2016-05-16 DIAGNOSIS — R918 Other nonspecific abnormal finding of lung field: Secondary | ICD-10-CM

## 2016-10-24 ENCOUNTER — Other Ambulatory Visit: Payer: Self-pay | Admitting: Specialist

## 2016-10-24 DIAGNOSIS — I712 Thoracic aortic aneurysm, without rupture: Secondary | ICD-10-CM

## 2016-10-24 DIAGNOSIS — R911 Solitary pulmonary nodule: Secondary | ICD-10-CM

## 2016-10-24 DIAGNOSIS — I7121 Aneurysm of the ascending aorta, without rupture: Secondary | ICD-10-CM

## 2016-10-24 DIAGNOSIS — R918 Other nonspecific abnormal finding of lung field: Secondary | ICD-10-CM

## 2016-10-26 ENCOUNTER — Other Ambulatory Visit: Payer: Self-pay | Admitting: Internal Medicine

## 2016-10-26 DIAGNOSIS — N184 Chronic kidney disease, stage 4 (severe): Secondary | ICD-10-CM

## 2016-10-31 ENCOUNTER — Other Ambulatory Visit: Payer: Self-pay | Admitting: Specialist

## 2016-10-31 DIAGNOSIS — I712 Thoracic aortic aneurysm, without rupture: Secondary | ICD-10-CM

## 2016-10-31 DIAGNOSIS — I7121 Aneurysm of the ascending aorta, without rupture: Secondary | ICD-10-CM

## 2016-10-31 DIAGNOSIS — R911 Solitary pulmonary nodule: Secondary | ICD-10-CM

## 2016-11-08 ENCOUNTER — Ambulatory Visit
Admission: RE | Admit: 2016-11-08 | Discharge: 2016-11-08 | Disposition: A | Discharge status: Home / Self Care | Payer: Medicare Other | Source: Ambulatory Visit | Attending: Specialist | Admitting: Specialist

## 2016-11-08 ENCOUNTER — Ambulatory Visit
Admission: RE | Admit: 2016-11-08 | Discharge: 2016-11-08 | Disposition: A | Discharge status: Home / Self Care | Payer: Medicare Other | Source: Ambulatory Visit | Attending: Internal Medicine | Admitting: Internal Medicine

## 2016-11-08 DIAGNOSIS — Z87891 Personal history of nicotine dependence: Secondary | ICD-10-CM | POA: Insufficient documentation

## 2016-11-08 DIAGNOSIS — R911 Solitary pulmonary nodule: Secondary | ICD-10-CM | POA: Insufficient documentation

## 2016-11-08 DIAGNOSIS — I712 Thoracic aortic aneurysm, without rupture: Secondary | ICD-10-CM | POA: Insufficient documentation

## 2016-11-08 DIAGNOSIS — N184 Chronic kidney disease, stage 4 (severe): Secondary | ICD-10-CM

## 2016-11-08 DIAGNOSIS — I700 Atherosclerosis of aorta: Secondary | ICD-10-CM | POA: Diagnosis not present

## 2016-11-08 DIAGNOSIS — I2510 Atherosclerotic heart disease of native coronary artery without angina pectoris: Secondary | ICD-10-CM | POA: Insufficient documentation

## 2016-11-08 DIAGNOSIS — I7121 Aneurysm of the ascending aorta, without rupture: Secondary | ICD-10-CM

## 2016-12-04 ENCOUNTER — Ambulatory Visit (INDEPENDENT_AMBULATORY_CARE_PROVIDER_SITE_OTHER): Payer: Medicare Other | Admitting: Vascular Surgery

## 2016-12-04 ENCOUNTER — Encounter (INDEPENDENT_AMBULATORY_CARE_PROVIDER_SITE_OTHER): Payer: Self-pay | Admitting: Vascular Surgery

## 2016-12-04 VITALS — BP 140/82 | HR 84 | Resp 16 | Ht 69.5 in | Wt 186.0 lb

## 2016-12-04 DIAGNOSIS — I712 Thoracic aortic aneurysm, without rupture, unspecified: Secondary | ICD-10-CM | POA: Insufficient documentation

## 2016-12-04 DIAGNOSIS — I10 Essential (primary) hypertension: Secondary | ICD-10-CM | POA: Diagnosis not present

## 2016-12-04 NOTE — Progress Notes (Signed)
Subjective:    Patient ID: [name], male    DOB: Jun 08, 1935, 81 y.o.   MRN: [id number] Chief Complaint  Patient presents with  . New Patient (Initial Visit)    AAA consult   Presents as a new patient referred by Dr. Vella Kohler for evaluation of an 4.0 cm ascending aortic aneurysm found on a CTA of the chest performed on May 14, 2016.  CT of the chest was performed for a 90mm right upper lobe nodule which has been stable for 9 months.  The patient presents today without complaint.  He denies any chest pain or shortness of breath.  Patient's blood pressure today was 140/82.  He denies any fever, nausea vomiting.   Review of Systems  Constitutional: Negative.   HENT: Negative.   Eyes: Negative.   Respiratory: Negative.   Cardiovascular:       Ascending aortic aneurysm  Gastrointestinal: Negative.   Endocrine: Negative.   Genitourinary: Negative.   Musculoskeletal: Negative.   Skin: Negative.   Allergic/Immunologic: Negative.   Neurological: Negative.   Hematological: Negative.   Psychiatric/Behavioral: Negative.       Objective:   Physical Exam  Constitutional: He is oriented to person, place, and time. He appears well-developed and well-nourished. No distress.  HENT:  Head: Normocephalic and atraumatic.  Eyes: Conjunctivae are normal. Pupils are equal, round, and reactive to light.  Neck: Normal range of motion.  Cardiovascular: Normal rate, regular rhythm, normal heart sounds and intact distal pulses.  Pulses:      Radial pulses are 2+ on the right side, and 2+ on the left side.       Dorsalis pedis pulses are 2+ on the right side, and 2+ on the left side.       Posterior tibial pulses are 2+ on the right side, and 2+ on the left side.  Pulmonary/Chest: Effort normal and breath sounds normal.  Abdominal: Soft. Bowel sounds are normal.  Musculoskeletal: He exhibits no edema.  Neurological: He is alert and oriented to person, place, and time.  Skin: Skin is warm and  dry. He is not diaphoretic.  Patient with greater than 1 cm varicosities noted to the left lower extremity.  There is no stasis dermatitis bilaterally.  There is no cellulitis.  Psychiatric: He has a normal mood and affect. His behavior is normal. Judgment and thought content normal.  Vitals reviewed.  BP 140/82 (BP Location: Right Arm, Patient Position: Sitting)   Pulse 84   Resp 16   Ht 5' 9.5" (1.765 m)   Wt 186 lb (84.4 kg)   BMI 27.07 kg/m   No past medical history on file.  Social History   Socioeconomic History  . Marital status: Married    Spouse name: Not on file  . Number of children: Not on file  . Years of education: Not on file  . Highest education level: Not on file  Social Needs  . Financial resource strain: Not on file  . Food insecurity - worry: Not on file  . Food insecurity - inability: Not on file  . Transportation needs - medical: Not on file  . Transportation needs - non-medical: Not on file  Occupational History  . Not on file  Tobacco Use  . Smoking status: Never Smoker  . Smokeless tobacco: Never Used  Substance and Sexual Activity  . Alcohol use: No    Frequency: Never  . Drug use: Not on file  . Sexual activity: Not on file  Other Topics Concern  . Not on file  Social History Narrative  . Not on file    Past Surgical History:  Procedure Laterality Date  . Right/Left Heart Cath and Coronary Angiography N/A 08/04/2015   Performed by Lujean Amel D, MD at [facility]   No family history on file.  Allergies  Allergen Reactions  . Ace Inhibitors     Other reaction(s): Other (See Comments) Renal failure.  . Isosorbide Nitrate     Other reaction(s): Headache Stomach ache,nausea  . Nsaids     Other reaction(s): Unknown  . Prednisone Other (See Comments)    Feels like pins/needles/sharp stick on the inside      Assessment & Plan:  Presents as a new patient referred by Dr. Vella Kohler for evaluation of an 4.0 cm ascending  aortic aneurysm found on a CTA of the chest performed on May 14, 2016.  CT of the chest was performed for a 8mm right upper lobe nodule which has been stable for 9 months.  The patient presents today without complaint.  He denies any chest pain or shortness of breath.  Patient's blood pressure today was 140/82.  He denies any fever, nausea vomiting.  1. Thoracic aortic aneurysm without rupture [facility]) - New Patient presents for evaluation of a 4.0 cm ascending aortic aneurysm. He presents today without complaint. The patient's physical exam is unremarkable. We had a long conversation about the patient's anatomy and pathophysiology of aneurysms. We also discussed the need for yearly follow-up with a CTA of the chest. The patient understands he will follow-up with [place] after he undergoes a CTA of his chest in 1 year. The patient is to seek medical attention immediately if he should experience shortness of breath or chest pain. The patient understands if his aneurysm should ever grow to greater than 6 cm in size he will be referred to a cardiothoracic surgeon for possible surgical intervention. He expresses understanding  - CT Angio Chest W/Cm &/Or Wo Cm; Future  2. Essential hypertension - Stable Encouraged good control as its slows the progression of atherosclerotic and aneurysmal disease  Current Outpatient Medications on File Prior to Visit  Medication Sig Dispense Refill  . ALPRAZolam (XANAX) 0.5 MG tablet Take 0.5 mg by mouth 2 (two) times daily as needed for anxiety.     Jearl Klinefelter ELLIPTA 62.5-25 MCG/INH AEPB     . citalopram (CELEXA) 20 MG tablet Take 20 mg by mouth daily.    . hydrochlorothiazide (HYDRODIURIL) 25 MG tablet Take 25 mg by mouth daily.    . Iron-Vitamin C (VITRON-C) 65-125 MG TABS Take 1 tablet by mouth daily.    . metoprolol tartrate (LOPRESSOR) 25 MG tablet Take 25 mg by mouth 2 (two) times daily.     Marland Kitchen omeprazole (PRILOSEC) 20 MG capsule Take 20 mg by mouth 2 (two) times  daily.    Marland Kitchen PROAIR HFA 108 (90 Base) MCG/ACT inhaler Inhale 2 puffs into the lungs every 6 (six) hours as needed for wheezing or shortness of breath.     . tamsulosin (FLOMAX) 0.4 MG CAPS capsule Take by mouth.    . traMADol (ULTRAM) 50 MG tablet Take 50 mg by mouth every 6 (six) hours as needed for moderate pain.      No current facility-administered medications on file prior to visit.    There are no Patient Instructions on file for this visit. No Follow-up on file.  Dalyla Chui A Jillienne Egner, PA-C

## 2017-07-03 ENCOUNTER — Other Ambulatory Visit: Payer: Self-pay | Admitting: Nephrology

## 2017-07-03 ENCOUNTER — Other Ambulatory Visit: Payer: Self-pay | Admitting: Psychiatry

## 2017-07-03 DIAGNOSIS — N184 Chronic kidney disease, stage 4 (severe): Secondary | ICD-10-CM

## 2017-07-10 ENCOUNTER — Ambulatory Visit: Payer: Medicare Other

## 2017-08-13 ENCOUNTER — Inpatient Hospital Stay
Admission: EM | Admit: 2017-08-13 | Discharge: 2017-08-19 | DRG: 065 | Disposition: A | Discharge status: Rehabilitation Facility | Payer: Medicare Other | Attending: Internal Medicine | Admitting: Internal Medicine

## 2017-08-13 ENCOUNTER — Observation Stay: Payer: Medicare Other

## 2017-08-13 ENCOUNTER — Emergency Department: Payer: Medicare Other

## 2017-08-13 ENCOUNTER — Other Ambulatory Visit: Payer: Self-pay

## 2017-08-13 DIAGNOSIS — E1122 Type 2 diabetes mellitus with diabetic chronic kidney disease: Secondary | ICD-10-CM | POA: Diagnosis present

## 2017-08-13 DIAGNOSIS — I639 Cerebral infarction, unspecified: Secondary | ICD-10-CM | POA: Diagnosis not present

## 2017-08-13 DIAGNOSIS — R531 Weakness: Secondary | ICD-10-CM

## 2017-08-13 DIAGNOSIS — R42 Dizziness and giddiness: Secondary | ICD-10-CM | POA: Diagnosis not present

## 2017-08-13 DIAGNOSIS — J449 Chronic obstructive pulmonary disease, unspecified: Secondary | ICD-10-CM

## 2017-08-13 DIAGNOSIS — K219 Gastro-esophageal reflux disease without esophagitis: Secondary | ICD-10-CM | POA: Diagnosis present

## 2017-08-13 DIAGNOSIS — N189 Chronic kidney disease, unspecified: Secondary | ICD-10-CM | POA: Diagnosis present

## 2017-08-13 DIAGNOSIS — R29810 Facial weakness: Secondary | ICD-10-CM | POA: Diagnosis present

## 2017-08-13 DIAGNOSIS — M159 Polyosteoarthritis, unspecified: Secondary | ICD-10-CM

## 2017-08-13 DIAGNOSIS — D631 Anemia in chronic kidney disease: Secondary | ICD-10-CM | POA: Diagnosis present

## 2017-08-13 DIAGNOSIS — I2510 Atherosclerotic heart disease of native coronary artery without angina pectoris: Secondary | ICD-10-CM | POA: Diagnosis present

## 2017-08-13 DIAGNOSIS — N184 Chronic kidney disease, stage 4 (severe): Secondary | ICD-10-CM | POA: Diagnosis present

## 2017-08-13 DIAGNOSIS — Z8719 Personal history of other diseases of the digestive system: Secondary | ICD-10-CM

## 2017-08-13 DIAGNOSIS — R29700 NIHSS score 0: Secondary | ICD-10-CM | POA: Diagnosis present

## 2017-08-13 DIAGNOSIS — N2581 Secondary hyperparathyroidism of renal origin: Secondary | ICD-10-CM | POA: Diagnosis present

## 2017-08-13 DIAGNOSIS — N179 Acute kidney failure, unspecified: Secondary | ICD-10-CM

## 2017-08-13 DIAGNOSIS — G2581 Restless legs syndrome: Secondary | ICD-10-CM | POA: Diagnosis present

## 2017-08-13 DIAGNOSIS — I6389 Other cerebral infarction: Secondary | ICD-10-CM

## 2017-08-13 DIAGNOSIS — E860 Dehydration: Secondary | ICD-10-CM

## 2017-08-13 DIAGNOSIS — G8194 Hemiplegia, unspecified affecting left nondominant side: Secondary | ICD-10-CM | POA: Diagnosis present

## 2017-08-13 DIAGNOSIS — I350 Nonrheumatic aortic (valve) stenosis: Secondary | ICD-10-CM | POA: Diagnosis present

## 2017-08-13 DIAGNOSIS — I129 Hypertensive chronic kidney disease with stage 1 through stage 4 chronic kidney disease, or unspecified chronic kidney disease: Secondary | ICD-10-CM | POA: Diagnosis present

## 2017-08-13 DIAGNOSIS — K610 Anal abscess: Secondary | ICD-10-CM

## 2017-08-13 DIAGNOSIS — N183 Chronic kidney disease, stage 3 (moderate): Secondary | ICD-10-CM

## 2017-08-13 DIAGNOSIS — Z79899 Other long term (current) drug therapy: Secondary | ICD-10-CM

## 2017-08-13 HISTORY — DX: Nonrheumatic aortic (valve) stenosis: I35.0

## 2017-08-13 HISTORY — DX: Atherosclerotic heart disease of native coronary artery without angina pectoris: I25.10

## 2017-08-13 HISTORY — DX: Chronic kidney disease, stage 4 (severe): N18.4

## 2017-08-13 HISTORY — DX: Essential (primary) hypertension: I10

## 2017-08-13 HISTORY — DX: Other nonspecific abnormal finding of lung field: R91.8

## 2017-08-13 HISTORY — DX: Hereditary and idiopathic neuropathy, unspecified: G60.9

## 2017-08-13 LAB — COMPREHENSIVE METABOLIC PANEL
ALT: 12 U/L (ref 0–44)
AST: 15 U/L (ref 15–41)
Albumin: 4 g/dL (ref 3.5–5.0)
Alkaline Phosphatase: 112 U/L (ref 38–126)
Anion gap: 6 (ref 5–15)
BILIRUBIN TOTAL: 0.5 mg/dL (ref 0.3–1.2)
BUN: 48 mg/dL — AB (ref 8–23)
CHLORIDE: 112 mmol/L — AB (ref 98–111)
CO2: 21 mmol/L — ABNORMAL LOW (ref 22–32)
CREATININE: 3.96 mg/dL — AB (ref 0.61–1.24)
Calcium: 8.7 mg/dL — ABNORMAL LOW (ref 8.9–10.3)
GFR, EST AFRICAN AMERICAN: 15 mL/min — AB (ref >60)
GFR, EST NON AFRICAN AMERICAN: 13 mL/min — AB (ref >60)
Glucose, Bld: 123 mg/dL — ABNORMAL HIGH (ref 70–99)
Potassium: 4.5 mmol/L (ref 3.5–5.1)
Sodium: 139 mmol/L (ref 135–145)
TOTAL PROTEIN: 7.3 g/dL (ref 6.5–8.1)

## 2017-08-13 LAB — URINALYSIS, COMPLETE (UACMP) WITH MICROSCOPIC
BILIRUBIN URINE: NEGATIVE
Bacteria, UA: NONE SEEN
GLUCOSE, UA: NEGATIVE mg/dL
Hgb urine dipstick: NEGATIVE
Ketones, ur: NEGATIVE mg/dL
Leukocytes, UA: NEGATIVE
NITRITE: NEGATIVE
Protein, ur: 100 mg/dL — AB
Specific Gravity, Urine: 1.012 (ref 1.005–1.030)
pH: 5 (ref 5.0–8.0)

## 2017-08-13 LAB — CBC WITH DIFFERENTIAL/PLATELET
BASOS ABS: 0 10*3/uL (ref 0–0.1)
Basophils Relative: 1 %
EOS PCT: 4 %
Eosinophils Absolute: 0.3 10*3/uL (ref 0–0.7)
HEMATOCRIT: 36.9 % — AB (ref 40.0–52.0)
Hemoglobin: 12.5 g/dL — ABNORMAL LOW (ref 13.0–18.0)
LYMPHS ABS: 1.1 10*3/uL (ref 1.0–3.6)
LYMPHS PCT: 16 %
MCH: 28.8 pg (ref 26.0–34.0)
MCHC: 33.9 g/dL (ref 32.0–36.0)
MCV: 84.9 fL (ref 80.0–100.0)
MONO ABS: 0.7 10*3/uL (ref 0.2–1.0)
Monocytes Relative: 9 %
NEUTROS ABS: 4.9 10*3/uL (ref 1.4–6.5)
Neutrophils Relative %: 70 %
PLATELETS: 184 10*3/uL (ref 150–440)
RBC: 4.35 MIL/uL — AB (ref 4.40–5.90)
RDW: 16.5 % — AB (ref 11.5–14.5)
WBC: 6.9 10*3/uL (ref 3.8–10.6)

## 2017-08-13 LAB — ETHANOL

## 2017-08-13 LAB — TROPONIN I: Troponin I: <0.03 ng/mL (ref <0.03)

## 2017-08-13 LAB — BRAIN NATRIURETIC PEPTIDE: B Natriuretic Peptide: 27 pg/mL (ref 0.0–100.0)

## 2017-08-13 LAB — MAGNESIUM: Magnesium: 2.1 mg/dL (ref 1.7–2.4)

## 2017-08-13 MED ORDER — TRAMADOL HCL 50 MG PO TABS
50.0000 mg | ORAL_TABLET | Freq: Four times a day (QID) | ORAL | Status: DC | PRN
  Administered 2017-08-18: 21:00:00 50 mg via ORAL
  Filled 2017-08-13: qty 1

## 2017-08-13 MED ORDER — MECLIZINE HCL 25 MG PO TABS
25.0000 mg | ORAL_TABLET | Freq: Once | ORAL | Status: AC
  Administered 2017-08-13: 25 mg via ORAL
  Filled 2017-08-13: qty 1

## 2017-08-13 MED ORDER — HEPARIN SODIUM (PORCINE) 5000 UNIT/ML IJ SOLN
5000.0000 [IU] | Freq: Three times a day (TID) | INTRAMUSCULAR | Status: DC
Start: 2017-08-13 — End: 2017-08-19
  Administered 2017-08-13 – 2017-08-19 (×16): 5000 [IU] via SUBCUTANEOUS
  Filled 2017-08-13 (×17): qty 1

## 2017-08-13 MED ORDER — SODIUM CHLORIDE 0.9 % IV BOLUS
500.0000 mL | Freq: Once | INTRAVENOUS | Status: AC
  Administered 2017-08-13: 500 mL via INTRAVENOUS

## 2017-08-13 MED ORDER — BISACODYL 5 MG PO TBEC: 5.0000 mg | DELAYED_RELEASE_TABLET | Freq: Every day | ORAL | Status: DC | PRN

## 2017-08-13 MED ORDER — ACETAMINOPHEN 650 MG RE SUPP: 650.0000 mg | Freq: Four times a day (QID) | RECTAL | Status: DC | PRN

## 2017-08-13 MED ORDER — ACETAMINOPHEN 325 MG PO TABS
650.0000 mg | ORAL_TABLET | Freq: Four times a day (QID) | ORAL | Status: DC | PRN
  Administered 2017-08-15: 650 mg via ORAL
  Filled 2017-08-13: qty 2

## 2017-08-13 MED ORDER — LORAZEPAM 2 MG/ML IJ SOLN
1.0000 mg | Freq: Once | INTRAMUSCULAR | Status: AC
  Administered 2017-08-13: 1 mg via INTRAVENOUS
  Filled 2017-08-13: qty 1

## 2017-08-13 MED ORDER — ONDANSETRON HCL 4 MG PO TABS: 4.0000 mg | ORAL_TABLET | Freq: Four times a day (QID) | ORAL | Status: DC | PRN

## 2017-08-13 MED ORDER — TAMSULOSIN HCL 0.4 MG PO CAPS
0.4000 mg | ORAL_CAPSULE | Freq: Every day | ORAL | Status: DC
  Administered 2017-08-14 – 2017-08-19 (×6): 0.4 mg via ORAL
  Filled 2017-08-13 (×6): qty 1

## 2017-08-13 MED ORDER — SENNOSIDES-DOCUSATE SODIUM 8.6-50 MG PO TABS: 1.0000 | ORAL_TABLET | Freq: Every evening | ORAL | Status: DC | PRN

## 2017-08-13 MED ORDER — ONDANSETRON HCL 4 MG/2ML IJ SOLN
4.0000 mg | Freq: Once | INTRAMUSCULAR | Status: AC
  Administered 2017-08-13: 4 mg via INTRAVENOUS
  Filled 2017-08-13: qty 2

## 2017-08-13 MED ORDER — HYDRALAZINE HCL 20 MG/ML IJ SOLN: 10.0000 mg | Freq: Four times a day (QID) | INTRAMUSCULAR | Status: DC | PRN

## 2017-08-13 MED ORDER — ALBUTEROL SULFATE (2.5 MG/3ML) 0.083% IN NEBU
3.0000 mL | INHALATION_SOLUTION | Freq: Four times a day (QID) | RESPIRATORY_TRACT | Status: DC | PRN
  Administered 2017-08-14 – 2017-08-19 (×4): 3 mL via RESPIRATORY_TRACT
  Filled 2017-08-13 (×2): qty 3

## 2017-08-13 MED ORDER — ALPRAZOLAM 0.5 MG PO TABS
0.5000 mg | ORAL_TABLET | Freq: Two times a day (BID) | ORAL | Status: DC | PRN
  Administered 2017-08-13 – 2017-08-17 (×2): 0.5 mg via ORAL
  Filled 2017-08-13 (×2): qty 1

## 2017-08-13 MED ORDER — SODIUM CHLORIDE 0.9 % IV SOLN
INTRAVENOUS | Status: DC
  Administered 2017-08-13 – 2017-08-17 (×7): via INTRAVENOUS

## 2017-08-13 MED ORDER — ONDANSETRON HCL 4 MG/2ML IJ SOLN
4.0000 mg | Freq: Four times a day (QID) | INTRAMUSCULAR | Status: DC | PRN
  Administered 2017-08-13 – 2017-08-17 (×3): 4 mg via INTRAVENOUS
  Filled 2017-08-13 (×3): qty 2

## 2017-08-13 MED ORDER — METOPROLOL TARTRATE 25 MG PO TABS
25.0000 mg | ORAL_TABLET | Freq: Two times a day (BID) | ORAL | Status: DC
  Administered 2017-08-13 – 2017-08-19 (×12): 25 mg via ORAL
  Filled 2017-08-13 (×12): qty 1

## 2017-08-13 MED ORDER — MECLIZINE HCL 25 MG PO TABS
25.0000 mg | ORAL_TABLET | Freq: Three times a day (TID) | ORAL | Status: DC
  Administered 2017-08-13: 25 mg via ORAL
  Filled 2017-08-13 (×2): qty 1

## 2017-08-13 MED ORDER — ONDANSETRON HCL 4 MG/2ML IJ SOLN
INTRAMUSCULAR | Status: AC
  Filled 2017-08-13: qty 2

## 2017-08-13 MED ORDER — PANTOPRAZOLE SODIUM 40 MG PO TBEC
40.0000 mg | DELAYED_RELEASE_TABLET | Freq: Every day | ORAL | Status: DC
  Administered 2017-08-14 – 2017-08-19 (×6): 40 mg via ORAL
  Filled 2017-08-13 (×6): qty 1

## 2017-08-13 MED ORDER — CITALOPRAM HYDROBROMIDE 20 MG PO TABS
30.0000 mg | ORAL_TABLET | Freq: Every day | ORAL | Status: DC
  Administered 2017-08-14 – 2017-08-19 (×6): 30 mg via ORAL
  Filled 2017-08-13 (×6): qty 2

## 2017-08-13 MED ORDER — UMECLIDINIUM-VILANTEROL 62.5-25 MCG/INH IN AEPB
1.0000 | INHALATION_SPRAY | Freq: Every day | RESPIRATORY_TRACT | Status: DC
  Administered 2017-08-14 – 2017-08-19 (×6): 1 via RESPIRATORY_TRACT
  Filled 2017-08-13: qty 14

## 2017-08-13 MED ORDER — ALBUTEROL SULFATE (2.5 MG/3ML) 0.083% IN NEBU
2.5000 mg | INHALATION_SOLUTION | RESPIRATORY_TRACT | Status: DC | PRN
  Filled 2017-08-13 (×2): qty 3

## 2017-08-13 NOTE — ED Notes (Signed)
Pt presented today from home via [facility] for dizziness and chest pain that started this morning after he ate breakfast. He is pale but alert. He has hx of multiple blockages but no stents placed. Hx of kidney disease. He got 4 mg Zofran with EMS. Grips equal and strong, plantar/dorsiflexion equal and strong, sensation intact, no facial weakness, no slurred speech. He is only complaining of dizziness at this time. Pt oriented x4.

## 2017-08-13 NOTE — ED Provider Notes (Addendum)
[facility] Emergency Department Provider Note  ____________________________________________   I have reviewed the triage vital signs and the nursing notes. Where available I have reviewed prior notes and, if possible and indicated, outside hospital notes.    HISTORY  Chief Complaint Dizziness    HPI [name] is a 82 y.o. male he states he is never had dizziness "this bad before", history of COPD, renal insufficiency, CAD states that he has been feeling dizzy for the last several hours.  To me he adamantly denies any chest pain.  He states he just feels dizzy.  Is a sensation of spinning, worse when he changes position or moves his head.  Did not run out of any medications no new medication no fever no chills, has had nausea received Zofran en route which is yet to help him.  Denies any fever or chills.  Does not have any focal numbness or weakness, no sensation of dysmetria however he states he just feels very dizzy.  This is something he states he is never had "this bad before".  Nothing makes better nothing makes it worse no other alleviating or aggravating symptoms.  And again he [place] denies any chest pain to me  History reviewed. No pertinent past medical history.  Patient Active Problem List   Diagnosis Date Noted  . Thoracic aortic aneurysm without rupture ([place]) 12/04/2016  . Essential hypertension 12/04/2016    Past Surgical History:  Procedure Laterality Date  . CARDIAC CATHETERIZATION N/A 08/04/2015   Procedure: Right/Left Heart Cath and Coronary Angiography;  Surgeon: Yolonda Kida, MD;  Location: [facility];  Service: Cardiovascular;  Laterality: N/A;    Prior to Admission medications   Medication Sig Start Date End Date Taking? Authorizing Provider  ALPRAZolam Duanne Moron) 0.5 MG tablet Take 0.5 mg by mouth 2 (two) times daily as needed for anxiety.  07/20/15   [provider]  Celedonio Miyamoto 62.5-25 MCG/INH AEPB  12/01/16    [provider]  citalopram (CELEXA) 20 MG tablet Take 20 mg by mouth daily. 07/29/15   [provider]  hydrochlorothiazide (HYDRODIURIL) 25 MG tablet Take 25 mg by mouth daily. 06/23/15   [provider]  Iron-Vitamin C (VITRON-C) 65-125 MG TABS Take 1 tablet by mouth daily.    [provider]  metoprolol tartrate (LOPRESSOR) 25 MG tablet Take 25 mg by mouth 2 (two) times daily.  06/27/15   [provider]  omeprazole (PRILOSEC) 20 MG capsule Take 20 mg by mouth 2 (two) times daily. 07/29/15   [provider]  PROAIR HFA 108 (90 Base) MCG/ACT inhaler Inhale 2 puffs into the lungs every 6 (six) hours as needed for wheezing or shortness of breath.  06/13/15   [provider]  tamsulosin (FLOMAX) 0.4 MG CAPS capsule Take by mouth. 10/25/16 10/25/17  [provider]  traMADol (ULTRAM) 50 MG tablet Take 50 mg by mouth every 6 (six) hours as needed for moderate pain.  07/20/15   [provider]    Allergies Ace inhibitors; Isosorbide nitrate; Nsaids; and Prednisone  No family history on file.  Social History Social History   Tobacco Use  . Smoking status: Never Smoker  . Smokeless tobacco: Never Used  Substance Use Topics  . Alcohol use: No    Frequency: Never  . Drug use: Not on file    Review of Systems Constitutional: No fever/chills Eyes: No visual changes. ENT: No sore throat. No stiff neck no neck pain  Cardiovascular: Denies chest pain. Respiratory: Denies shortness of breath. Gastrointestinal:   Positive nausea no vomiting.  No diarrhea.  No constipation. Genitourinary: Negative for dysuria. Musculoskeletal: Negative lower extremity swelling Skin: Negative for rash. Neurological: Negative for severe headaches, focal weakness or numbness.   ____________________________________________   PHYSICAL EXAM:  VITAL SIGNS: ED Triage Vitals  Enc Vitals Group     BP 08/13/17 1351 (!) 168/91     Pulse  Rate 08/13/17 1351 62     Resp 08/13/17 1351 15     Temp 08/13/17 1351 (!) 97.4 F (36.3 C)     Temp Source 08/13/17 1351 Oral     SpO2 08/13/17 1351 95 %     Weight 08/13/17 1345 190 lb (86.2 kg)     Height 08/13/17 1345 6' (1.829 m)     Head Circumference --      Peak Flow --      Pain Score 08/13/17 1345 0     Pain Loc --      Pain Edu? --      Excl. in [place]? --     Constitutional: Alert and oriented. Well appearing and in no acute distress.  Appears as if he feels uncomfortable but does not appear to be toxic Eyes: Conjunctivae are normal Head: Atraumatic HEENT: No congestion/rhinnorhea. Mucous membranes are moist.  Oropharynx non-erythematous Neck:   Nontender with no meningismus, no masses, no stridor Cardiovascular: Normal rate, regular rhythm. Grossly normal heart sounds.  Good peripheral circulation. Respiratory: Normal respiratory effort.  No retractions. Lungs CTAB. Abdominal: Soft and nontender. No distention. No guarding no rebound Back:  There is no focal tenderness or step off.  there is no midline tenderness there are no lesions noted. there is no CVA tenderness Musculoskeletal: No lower extremity tenderness, no upper extremity tenderness. No joint effusions, no DVT signs strong distal pulses no edema Neurologic:  Normal speech and language.  Cranial nerves II through XII are grossly intact 5 out of 5 strength bilateral upper and lower extremity finger-to-nose within normal limits heel-to-shin within normal limits rapid alternating hand motions within normal limits, positive nystagmus noted, TMs are noted to be normal bilaterally Skin:  Skin is warm, dry and intact. No rash noted. Psychiatric: Mood and affect are normal. Speech and behavior are normal.  ____________________________________________   LABS (all labs ordered are listed, but only abnormal results are displayed)  Labs Reviewed  BRAIN NATRIURETIC PEPTIDE  TROPONIN I  CBC WITH DIFFERENTIAL/PLATELET   COMPREHENSIVE METABOLIC PANEL  URINALYSIS, COMPLETE (UACMP) WITH MICROSCOPIC  ETHANOL    Pertinent labs  results that were available during my care of the patient were reviewed by me and considered in my medical decision making (see chart for details). ____________________________________________  EKG  I personally interpreted any EKGs ordered by me or triage Sinus rhythm rate 60 bpm no acute ST elevation or depression, likely repolarization abnormality noted.  No old for some comparison nonspecific ST changes noted. ____________________________________________  RADIOLOGY  Pertinent labs & imaging results that were available during my care of the patient were reviewed by me and considered in my medical decision making (see chart for details). If possible, patient and/or family made aware of any abnormal findings.  No results found. ____________________________________________    PROCEDURES  Procedure(s) performed: None  Procedures  Critical Care performed: CRITICAL CARE Performed by: Schuyler Amor   Total critical care time: 45 minutes  Critical care time was exclusive of separately billable procedures and treating other patients.  Critical care was necessary to treat or prevent imminent or life-threatening deterioration.  Critical care was time spent personally by me on the following activities: development of treatment plan with patient and/or surrogate as well as nursing, discussions with consultants, evaluation of patient's response to treatment, examination of patient, obtaining history from patient or surrogate, ordering and performing treatments and interventions, ordering and review of laboratory studies, ordering and review of radiographic studies, pulse oximetry and re-evaluation of patient's condition. RE   ____________________________________________   INITIAL IMPRESSION / ASSESSMENT AND PLAN / ED COURSE  Pertinent labs & imaging results that were  available during my care of the patient were reviewed by me and considered in my medical decision making (see chart for details).  Patient here with what he describes as true spinning vertigo which is new for him.  He states he is never had it "this bad before.  Borderline ST elevation in aVF but no morphology suggestive of acute coronary syndrome and he denies chest pain we will send troponin.  I will give him antivertigo medication for swallow study, will obtain a CT scan of his head , will administer IV fluids, patient resting comfortably at this time except for the fact that he feels nauseated and feels dizzy when he moves his head  ----------------------------------------- 3:12 PM on 08/13/2017 -----------------------------------------  Since baseline creatinines around 3, 3.3 when last checked is almost 4 today, BUN is also elevated, we are giving him IV fluid for this.  He is feeling much better in terms of his nausea, has decreased but still present vertiginous symptoms, no focal numbness or weakness noted, with a low NIH scale of 0 at this time, I did consult with neurology, Dr. Doy Mince, we discussed his findings including CT findings, she does recommend MRI MRA, and patient think will be admitted for further evaluation of this.  She does not feel the patient is a candidate for TPA or other acute interventions from neurology point of view.   ____________________________________________   FINAL CLINICAL IMPRESSION(S) / ED DIAGNOSES  Final diagnoses:  Weakness      This chart was dictated using voice recognition software.  Despite best efforts to proofread,  errors can occur which can change meaning.      Schuyler Amor, MD 08/13/17 1422    Schuyler Amor, MD 08/13/17 1517    Schuyler Amor, MD 08/13/17 [phone]

## 2017-08-13 NOTE — Progress Notes (Signed)
Advanced Care Plan.  Purpose of Encounter: CODE STATUS. Parties in Attendance: The patient, his wife and daughter.  Me. Patient's Decisional Capacity: Yes. Medical Story: [name]  is a 82 y.o. male with a known history of CAD, aortic stenosis, CKD stage IV, hypertension, pulmonary nodule and hereditary and angiopathic peripheral neuropathy.  He is being admitted for dizziness, acute on chronic renal failure.  I discussed with the patient about his current condition, prognosis and CODE STATUS.  The patient stated that he wants full code.  Plan:  Code Status:  Time spent discussing advance care planning: 17 minutes.

## 2017-08-13 NOTE — ED Notes (Signed)
Called report to Armed forces training and education officer on 1 C

## 2017-08-13 NOTE — H&P (Signed)
[facility] [place] NAME: [name]    MR#:  [id number]  DATE OF BIRTH:  02-Jul-1935  DATE OF ADMISSION:  08/13/2017  PRIMARY CARE PHYSICIAN: Rusty Aus, MD   REQUESTING/REFERRING PHYSICIAN: Dr. Burlene Arnt.  CHIEF COMPLAINT:   Chief Complaint  Patient presents with  . Dizziness   Dizziness sore hours today. HISTORY OF PRESENT ILLNESS:  [name]  is a 82 y.o. male with a known history of CAD, aortic stenosis, CKD stage IV, hypertension, pulmonary nodule and hereditary and angiopathic peripheral neuropathy.  The patient presents the ED with above chief complaints.  Dizziness is a sensation of spinning, worse with changing position or moving his head.  He also complains of left side numbness, tingling and weakness. CT head did not show acute findings.  Chest x-ray and urinalysis are not remarkable.  PAST MEDICAL HISTORY:   Past Medical History:  Diagnosis Date  . Aortic stenosis   . CAD (coronary artery disease)   . CKD (chronic kidney disease) stage 4, GFR 15-29 ml/min (HCC)   . Hereditary and idiopathic peripheral neuropathy   . HTN (hypertension)   . Pulmonary nodules     PAST SURGICAL HISTORY:   Past Surgical History:  Procedure Laterality Date  . CARDIAC CATHETERIZATION N/A 08/04/2015   Procedure: Right/Left Heart Cath and Coronary Angiography;  Surgeon: Yolonda Kida, MD;  Location: [facility];  Service: Cardiovascular;  Laterality: N/A;    SOCIAL HISTORY:   Social History   Tobacco Use  . Smoking status: Never Smoker  . Smokeless tobacco: Never Used  Substance Use Topics  . Alcohol use: No    Frequency: Never    FAMILY HISTORY:   Family History  Problem Relation Age of Onset  . Hypertension Mother   . Heart attack Father     DRUG ALLERGIES:   Allergies  Allergen Reactions  . Ace Inhibitors     Other reaction(s): Other (See Comments) Renal failure.  . Isosorbide Nitrate     Other  reaction(s): Headache Stomach ache,nausea  . Nsaids     Other reaction(s): Unknown  . Prednisone Other (See Comments)    Feels like pins/needles/sharp stick on the inside    REVIEW OF SYSTEMS:   Review of Systems  Constitutional: Negative for chills, fever and malaise/fatigue.  HENT: Negative for sore throat.   Eyes: Negative for blurred vision and double vision.  Respiratory: Negative for cough, hemoptysis, shortness of breath, wheezing and stridor.   Cardiovascular: Negative for chest pain, palpitations, orthopnea and leg swelling.  Gastrointestinal: Negative for abdominal pain, blood in stool, diarrhea, melena, nausea and vomiting.  Genitourinary: Negative for dysuria, flank pain and hematuria.  Musculoskeletal: Negative for back pain and joint pain.  Neurological: Positive for dizziness, tingling, sensory change and focal weakness. Negative for seizures, loss of consciousness, weakness and headaches.  Endo/Heme/Allergies: Negative for polydipsia.  Psychiatric/Behavioral: Negative for depression. The patient is not nervous/anxious.     MEDICATIONS AT HOME:   Prior to Admission medications   Medication Sig Start Date End Date Taking? Authorizing Provider  ALPRAZolam Duanne Moron) 0.5 MG tablet Take 0.5 mg by mouth 2 (two) times daily as needed for anxiety.  07/20/15  Yes [provider]  ANORO ELLIPTA 62.5-25 MCG/INH AEPB Inhale 1 puff into the lungs daily.  12/01/16  Yes [provider]  citalopram (CELEXA) 20 MG tablet Take 30 mg by mouth daily.  07/29/15  Yes [provider]  furosemide (LASIX) 20 MG tablet Take 20 mg by mouth daily. 08/09/17  Yes [provider]  hydrochlorothiazide (HYDRODIURIL) 25 MG tablet Take 25 mg by mouth daily. 06/23/15  Yes [provider]  Iron-Vitamin C (VITRON-C) 65-125 MG TABS Take 1 tablet by mouth daily.   Yes [provider]  metoprolol tartrate (LOPRESSOR) 25 MG tablet Take 25 mg by mouth 2 (two) times  daily.  06/27/15  Yes [provider]  omeprazole (PRILOSEC) 20 MG capsule Take 20 mg by mouth 2 (two) times daily. 07/29/15  Yes [provider]  PROAIR HFA 108 (90 Base) MCG/ACT inhaler Inhale 2 puffs into the lungs every 6 (six) hours as needed for wheezing or shortness of breath.  06/13/15  Yes [provider]  tamsulosin (FLOMAX) 0.4 MG CAPS capsule Take 0.4 mg by mouth daily after breakfast.  10/25/16 10/25/17 Yes [provider]  traMADol (ULTRAM) 50 MG tablet Take 50 mg by mouth every 6 (six) hours as needed for moderate pain.  07/20/15  Yes [provider]      VITAL SIGNS:  Blood pressure (!) 172/94, pulse 65, temperature (!) 97.4 F (36.3 C), temperature source Oral, resp. rate 18, height 6' (1.829 m), weight 190 lb (86.2 kg), SpO2 100 %.  PHYSICAL EXAMINATION:  Physical Exam  GENERAL:  82 y.o.-year-old patient lying in the bed with no acute distress.  EYES: Pupils equal, round, reactive to light and accommodation. No scleral icterus. Extraocular muscles intact.  HEENT: Head atraumatic, normocephalic. Oropharynx and nasopharynx clear.  NECK:  Supple, no jugular venous distention. No thyroid enlargement, no tenderness.  LUNGS: Normal breath sounds bilaterally, no wheezing, rales,rhonchi or crepitation. No use of accessory muscles of respiration.  CARDIOVASCULAR: S1, S2 normal. No murmurs, rubs, or gallops.  ABDOMEN: Soft, nontender, nondistended. Bowel sounds present. No organomegaly or mass.  EXTREMITIES: No pedal edema, cyanosis, or clubbing.  NEUROLOGIC: Cranial nerves II through XII are intact. Muscle strength 5/5 in all extremities. Sensation intact. Gait not checked.  PSYCHIATRIC: The patient is alert and oriented x 3.  SKIN: No obvious rash, lesion, or ulcer.   LABORATORY PANEL:   CBC Recent Labs  Lab 08/13/17 1344  WBC 6.9  HGB 12.5*  HCT 36.9*  PLT 184    ------------------------------------------------------------------------------------------------------------------  Chemistries  Recent Labs  Lab 08/13/17 1344  NA 139  K 4.5  CL 112*  CO2 21*  GLUCOSE 123*  BUN 48*  CREATININE 3.96*  CALCIUM 8.7*  AST 15  ALT 12  ALKPHOS 112  BILITOT 0.5   ------------------------------------------------------------------------------------------------------------------  Cardiac Enzymes Recent Labs  Lab 08/13/17 1344  TROPONINI <0.03   ------------------------------------------------------------------------------------------------------------------  RADIOLOGY:  Ct Head Wo Contrast  Result Date: 08/13/2017 CLINICAL DATA:  Acute presentation with severe dizziness. EXAM: CT HEAD WITHOUT CONTRAST TECHNIQUE: Contiguous axial images were obtained from the base of the skull through the vertex without intravenous contrast. COMPARISON:  None. FINDINGS: Brain: Generalized atrophy. No sign of acute infarction, mass lesion, hemorrhage, hydrocephalus or extra-axial collection. 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 Vessel diameter up to 14 mm in diameter either at the distal vertebral or proximal basilar artery. Lesser ectasia of the supraclinoid internal carotid arteries. Skull: Normal Sinuses/Orbits: Clear sinuses. No fluid in the middle ears or mastoids. Orbits negative. Other: None IMPRESSION: No acute finding. The patient does have advanced atherosclerotic disease with dolichoectasia of the intracranial vessels, including dilatation of either the distal vertebral artery or the proximal basilar artery to a  diameter of 14 mm. Given this, the possibility of acute posterior circulation ischemia should be considered given this presentation, though there is no discrete CT finding presently. Electronically Signed   By: Nelson Chimes M.D.   On: 08/13/2017 14:42   Dg Chest Port 1 View  Result Date:  08/13/2017 CLINICAL DATA:  Acute presentation with dizziness and chest pain. EXAM: PORTABLE CHEST 1 VIEW COMPARISON:  09/30/2013 FINDINGS: Poor inspiration. The upper lungs are clear. Abnormal density in both lower lobes could relate to the poor inspiration or there could be lower lobe atelectasis or pneumonia. No dense consolidation or lobar collapse. No effusions. No abnormal bone finding. Heart size appears normal. There is aortic atherosclerosis. IMPRESSION: Poor inspiration. Increased markings at the lung bases may be secondary to that. The possibility of lower lobe atelectasis or pneumonia does exist based on this exam. Electronically Signed   By: Nelson Chimes M.D.   On: 08/13/2017 14:38      IMPRESSION AND PLAN:   Dizziness, possible due to acute renal failure on CKD stage IV secondary to dehydration. The patient will be placed for observation. Hold diuretics, start IV normal saline in the follow-up BMP.  Vertigo/dizziness/left-sided numbness and tingling.   Antivert 3 times daily. I doubt CVA. Follow-up MRI of the brain, which was ordered by ED physician.  Hypertension.  Continue Lopressor but hold diuretics.  IV hydralazine PRN.  All the records are reviewed and case discussed with ED provider. Management plans discussed with the patient, daughter and wife and they are in agreement.  CODE STATUS: Full code  TOTAL TIME TAKING CARE OF THIS PATIENT: 39 minutes.    Demetrios Loll M.D on 08/13/2017 at 4:35 PM  Between 7am to 6pm - Pager - [phone]  After 6pm go to [email or web address] - Proofreader  [facility]  Office  [phone]  CC: Primary care physician; Rusty Aus, MD   Note: This dictation was prepared with Dragon dictation along with smaller phrase technology. Any transcriptional errors that result from this process are unin

## 2017-08-13 NOTE — Progress Notes (Signed)
Patient c/o nausea and dizziness. IV Zofran given. PO Meclizine given. Patient refusing to be repositioned to complete skin assessment at this time, stating "just don't move me."   MRI to be completed. IV Ativan ordered per patient request. Madlyn Frankel, RN

## 2017-08-13 NOTE — ED Notes (Signed)
Inpatient MD at bedside

## 2017-08-14 ENCOUNTER — Inpatient Hospital Stay (HOSPITAL_COMMUNITY)
Admit: 2017-08-14 | Discharge: 2017-08-14 | Disposition: A | Discharge status: Home / Self Care | Payer: Medicare Other | Attending: Internal Medicine | Admitting: Internal Medicine

## 2017-08-14 ENCOUNTER — Observation Stay: Payer: Medicare Other

## 2017-08-14 DIAGNOSIS — G8194 Hemiplegia, unspecified affecting left nondominant side: Secondary | ICD-10-CM | POA: Diagnosis present

## 2017-08-14 DIAGNOSIS — R29810 Facial weakness: Secondary | ICD-10-CM | POA: Diagnosis present

## 2017-08-14 DIAGNOSIS — I2510 Atherosclerotic heart disease of native coronary artery without angina pectoris: Secondary | ICD-10-CM | POA: Diagnosis present

## 2017-08-14 DIAGNOSIS — I361 Nonrheumatic tricuspid (valve) insufficiency: Secondary | ICD-10-CM | POA: Diagnosis not present

## 2017-08-14 DIAGNOSIS — N184 Chronic kidney disease, stage 4 (severe): Secondary | ICD-10-CM | POA: Diagnosis present

## 2017-08-14 DIAGNOSIS — I639 Cerebral infarction, unspecified: Secondary | ICD-10-CM | POA: Diagnosis not present

## 2017-08-14 DIAGNOSIS — N2581 Secondary hyperparathyroidism of renal origin: Secondary | ICD-10-CM | POA: Diagnosis present

## 2017-08-14 DIAGNOSIS — Z79899 Other long term (current) drug therapy: Secondary | ICD-10-CM | POA: Diagnosis not present

## 2017-08-14 DIAGNOSIS — N179 Acute kidney failure, unspecified: Secondary | ICD-10-CM | POA: Diagnosis present

## 2017-08-14 DIAGNOSIS — E1122 Type 2 diabetes mellitus with diabetic chronic kidney disease: Secondary | ICD-10-CM | POA: Diagnosis present

## 2017-08-14 DIAGNOSIS — D631 Anemia in chronic kidney disease: Secondary | ICD-10-CM | POA: Diagnosis present

## 2017-08-14 DIAGNOSIS — K219 Gastro-esophageal reflux disease without esophagitis: Secondary | ICD-10-CM | POA: Diagnosis present

## 2017-08-14 DIAGNOSIS — K610 Anal abscess: Secondary | ICD-10-CM | POA: Diagnosis present

## 2017-08-14 DIAGNOSIS — R29700 NIHSS score 0: Secondary | ICD-10-CM | POA: Diagnosis present

## 2017-08-14 DIAGNOSIS — G2581 Restless legs syndrome: Secondary | ICD-10-CM | POA: Diagnosis present

## 2017-08-14 DIAGNOSIS — J449 Chronic obstructive pulmonary disease, unspecified: Secondary | ICD-10-CM | POA: Diagnosis present

## 2017-08-14 DIAGNOSIS — R42 Dizziness and giddiness: Secondary | ICD-10-CM | POA: Diagnosis present

## 2017-08-14 DIAGNOSIS — I129 Hypertensive chronic kidney disease with stage 1 through stage 4 chronic kidney disease, or unspecified chronic kidney disease: Secondary | ICD-10-CM | POA: Diagnosis present

## 2017-08-14 DIAGNOSIS — I350 Nonrheumatic aortic (valve) stenosis: Secondary | ICD-10-CM | POA: Diagnosis not present

## 2017-08-14 LAB — BASIC METABOLIC PANEL
Anion gap: 4 — ABNORMAL LOW (ref 5–15)
BUN: 47 mg/dL — ABNORMAL HIGH (ref 8–23)
CO2: 22 mmol/L (ref 22–32)
Calcium: 8.5 mg/dL — ABNORMAL LOW (ref 8.9–10.3)
Chloride: 113 mmol/L — ABNORMAL HIGH (ref 98–111)
Creatinine, Ser: 3.72 mg/dL — ABNORMAL HIGH (ref 0.61–1.24)
GFR calc Af Amer: 16 mL/min — ABNORMAL LOW (ref >60)
GFR calc non Af Amer: 14 mL/min — ABNORMAL LOW (ref >60)
Glucose, Bld: 103 mg/dL — ABNORMAL HIGH (ref 70–99)
Potassium: 5.1 mmol/L (ref 3.5–5.1)
Sodium: 139 mmol/L (ref 135–145)

## 2017-08-14 MED ORDER — HYPROMELLOSE (GONIOSCOPIC) 2.5 % OP SOLN: 1.0000 [drp] | Freq: Four times a day (QID) | OPHTHALMIC | Status: DC | PRN

## 2017-08-14 MED ORDER — POLYVINYL ALCOHOL 1.4 % OP SOLN
1.0000 [drp] | Freq: Four times a day (QID) | OPHTHALMIC | Status: DC | PRN
  Administered 2017-08-15 – 2017-08-17 (×6): 1 [drp] via OPHTHALMIC
  Filled 2017-08-14: qty 15

## 2017-08-14 MED ORDER — ATORVASTATIN CALCIUM 20 MG PO TABS
40.0000 mg | ORAL_TABLET | Freq: Every day | ORAL | Status: DC
  Administered 2017-08-14 – 2017-08-18 (×4): 40 mg via ORAL
  Filled 2017-08-14 (×5): qty 2

## 2017-08-14 MED ORDER — ASPIRIN 81 MG PO CHEW
81.0000 mg | CHEWABLE_TABLET | Freq: Every day | ORAL | Status: DC
  Administered 2017-08-14 – 2017-08-19 (×6): 81 mg via ORAL
  Filled 2017-08-14 (×6): qty 1

## 2017-08-14 MED ORDER — DIPHENHYDRAMINE HCL 50 MG/ML IJ SOLN
25.0000 mg | Freq: Four times a day (QID) | INTRAMUSCULAR | Status: DC | PRN
  Administered 2017-08-15: 25 mg via INTRAVENOUS
  Filled 2017-08-14 (×2): qty 0.5

## 2017-08-14 NOTE — Consult Note (Signed)
Referring Physician: Tressia Miners    Chief Complaint: Dizziness  HPI: [name] is an 82 y.o. male with a history of hypertension who presented on yesterday with complaints of acute onset of dizziness.  The patient reports that he awakened on yesterday with severe dizziness.  Had nausea and vomiting.  Felt off balance as well.  Initially without any weakness but reports that not Cole after admission developed weakness on the left.   Initial NIHSS of 0.  Date last known well: Date: 08/12/2017 Time last known well: Time: 19:00 tPA Given: No: Outside time window  Past Medical History:  Diagnosis Date  . Aortic stenosis   . CAD (coronary artery disease)   . CKD (chronic kidney disease) stage 4, GFR 15-29 ml/min (HCC)   . Hereditary and idiopathic peripheral neuropathy   . HTN (hypertension)   . Pulmonary nodules     Past Surgical History:  Procedure Laterality Date  . CARDIAC CATHETERIZATION N/A 08/04/2015   Procedure: Right/Left Heart Cath and Coronary Angiography;  Surgeon: Yolonda Kida, MD;  Location: [facility];  Service: Cardiovascular;  Laterality: N/A;    Family History  Problem Relation Age of Onset  . Hypertension Mother   . Heart attack Father    Social History:  reports that he has never smoked. He has never used smokeless tobacco. He reports that he does not drink alcohol or use drugs.  Allergies:  Allergies  Allergen Reactions  . Ace Inhibitors     Other reaction(s): Other (See Comments) Renal failure.  . Isosorbide Nitrate     Other reaction(s): Headache Stomach ache,nausea  . Nsaids     Other reaction(s): Unknown  . Prednisone Other (See Comments)    Feels like pins/needles/sharp stick on the inside    Medications:  I have reviewed the patient's current medications. Prior to Admission:  Medications Prior to Admission  Medication Sig Dispense Refill Last Dose  . ALPRAZolam (XANAX) 0.5 MG tablet Take 0.5 mg by mouth 2 (two) times daily as  needed for anxiety.    PRN at PRN  . ANORO ELLIPTA 62.5-25 MCG/INH AEPB Inhale 1 puff into the lungs daily.    08/13/2017 at 0800  . citalopram (CELEXA) 20 MG tablet Take 30 mg by mouth daily.    08/13/2017 at 0800  . furosemide (LASIX) 20 MG tablet Take 20 mg by mouth daily.   08/13/2017 at 0800  . hydrochlorothiazide (HYDRODIURIL) 25 MG tablet Take 25 mg by mouth daily.   08/13/2017 at 0800  . Iron-Vitamin C (VITRON-C) 65-125 MG TABS Take 1 tablet by mouth daily.   08/13/2017 at 0800  . metoprolol tartrate (LOPRESSOR) 25 MG tablet Take 25 mg by mouth 2 (two) times daily.    08/13/2017 at 0800  . omeprazole (PRILOSEC) 20 MG capsule Take 20 mg by mouth 2 (two) times daily.   08/13/2017 at 0800  . PROAIR HFA 108 (90 Base) MCG/ACT inhaler Inhale 2 puffs into the lungs every 6 (six) hours as needed for wheezing or shortness of breath.    PRN at PRN  . tamsulosin (FLOMAX) 0.4 MG CAPS capsule Take 0.4 mg by mouth daily after breakfast.    08/13/2017 at 0800  . traMADol (ULTRAM) 50 MG tablet Take 50 mg by mouth every 6 (six) hours as needed for moderate pain.    PRN at PRN   Scheduled: . citalopram  30 mg Oral Daily  . heparin  5,000 Units Subcutaneous Q8H  . metoprolol tartrate  25 mg Oral BID  . pantoprazole  40 mg Oral QAC breakfast  . tamsulosin  0.4 mg Oral QPC breakfast  . umeclidinium-vilanterol  1 puff Inhalation Daily    ROS: History obtained from the patient  General ROS: negative for - chills, fatigue, fever, night sweats, weight gain or weight loss Psychological ROS: negative for - behavioral disorder, hallucinations, memory difficulties, mood swings or suicidal ideation Ophthalmic ROS: negative for - blurry vision, double vision, eye pain or loss of vision ENT ROS: as noted in HPI Allergy and Immunology ROS: negative for - hives or itchy/watery eyes Hematological and Lymphatic ROS: negative for - bleeding problems, bruising or swollen lymph nodes Endocrine ROS: negative for -  galactorrhea, hair pattern changes, polydipsia/polyuria or temperature intolerance Respiratory ROS: negative for - cough, hemoptysis, shortness of breath or wheezing Cardiovascular ROS: negative for - chest pain, dyspnea on exertion, edema or irregular heartbeat Gastrointestinal ROS: as noted in HPI Genito-Urinary ROS: negative for - dysuria, hematuria, incontinence or urinary frequency/urgency Musculoskeletal ROS: negative for - joint swelling or muscular weakness Neurological ROS: as noted in HPI Dermatological ROS: negative for rash and skin lesion changes  Physical Examination: Blood pressure 130/83, pulse 65, temperature 98.4 F (36.9 C), temperature source Oral, resp. rate 18, height 6' (1.829 m), weight 86.2 kg (190 lb), SpO2 97 %.  HEENT-  Normocephalic, no lesions, without obvious abnormality.  Normal external eye and conjunctiva.  Normal TM's bilaterally.  Normal auditory canals and external ears. Normal external nose, mucus membranes and septum.  Normal pharynx. Cardiovascular- S1, S2 normal, pulses palpable throughout   Lungs- chest clear, no wheezing, rales, normal symmetric air entry Abdomen- soft, non-tender; bowel sounds normal; no masses,  no organomegaly Extremities- no edema Lymph-no adenopathy palpable Musculoskeletal-no joint tenderness, deformity or swelling Skin-warm and dry, no hyperpigmentation, vitiligo, or suspicious lesions  Neurological Examination   Mental Status: Lethargic, oriented, thought content appropriate.  Speech dysarthric.  Able to follow 3 step commands without difficulty. Cranial Nerves: II: Discs flat bilaterally; Visual fields grossly normal, pupils equal, round, reactive to light and accommodation III,IV, VI: ptosis not present, extra-ocular motions intact bilaterally V,VII: decrease in left NLF, facial light touch sensation normal bilaterally VIII: hearing normal bilaterally IX,X: gag reflex present XI: bilateral shoulder shrug XII:  midline tongue extension Motor: Right : Upper extremity   5/5    Left:     Upper extremity   1-2/5  Lower extremity   5/5     Lower extremity   3/5 Tone and bulk:normal tone throughout; no atrophy noted Sensory: Pinprick and light touch decreased in the LLE Deep Tendon Reflexes: 2+ and symmetric with absent AJ's bilaterally Plantars: Right: downgoing   Left: downgoing Cerebellar: Normal finger-to-nose and normal heel-to-shin testing bilaterally Gait: nt tested due to safety concerns    Laboratory Studies:  Basic Metabolic Panel: Recent Labs  Lab 08/13/17 1344 08/14/17 0330  NA 139 139  K 4.5 5.1  CL 112* 113*  CO2 21* 22  GLUCOSE 123* 103*  BUN 48* 47*  CREATININE 3.96* 3.72*  CALCIUM 8.7* 8.5*  MG 2.1  --     Liver Function Tests: Recent Labs  Lab 08/13/17 1344  AST 15  ALT 12  ALKPHOS 112  BILITOT 0.5  PROT 7.3  ALBUMIN 4.0   No results for input(s): LIPASE, AMYLASE in the last 168 hours. No results for input(s): AMMONIA in the last 168 hours.  CBC: Recent Labs  Lab 08/13/17 1344  WBC 6.9  NEUTROABS 4.9  HGB 12.5*  HCT 36.9*  MCV 84.9  PLT 184    Cardiac Enzymes: Recent Labs  Lab 08/13/17 1344  TROPONINI <0.03    BNP: Invalid input(s): POCBNP  CBG: No results for input(s): GLUCAP in the last 168 hours.  Microbiology: No results found for this or any previous visit.  Coagulation Studies: No results for input(s): LABPROT, INR in the last 72 hours.  Urinalysis:  Recent Labs  Lab 08/13/17 1442  COLORURINE YELLOW*  LABSPEC 1.012  PHURINE 5.0  GLUCOSEU NEGATIVE  HGBUR NEGATIVE  BILIRUBINUR NEGATIVE  KETONESUR NEGATIVE  PROTEINUR 100*  NITRITE NEGATIVE  LEUKOCYTESUR NEGATIVE    Lipid Panel:    Component Value Date/Time   CHOL 136 02/08/2011 0441   TRIG 130 02/08/2011 0441   HDL 23 (L) 02/08/2011 0441   VLDL 26 02/08/2011 0441   LDLCALC 87 02/08/2011 0441    HgbA1C:  Lab Results  Component Value Date   HGBA1C 5.3  02/08/2011    Urine Drug Screen:  No results found for: LABOPIA, COCAINSCRNUR, LABBENZ, AMPHETMU, THCU, LABBARB  Alcohol Level:  Recent Labs  Lab 08/13/17 1344  ETH <10    Imaging: Ct Head Wo Contrast  Result Date: 08/13/2017 CLINICAL DATA:  Acute presentation with severe dizziness. EXAM: CT HEAD WITHOUT CONTRAST TECHNIQUE: Contiguous axial images were obtained from the base of the skull through the vertex without intravenous contrast. COMPARISON:  None. FINDINGS: Brain: Generalized atrophy. No sign of acute infarction, mass lesion, hemorrhage, hydrocephalus or extra-axial collection. 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 Vessel diameter up to 14 mm in diameter either at the distal vertebral or proximal basilar artery. Lesser ectasia of the supraclinoid internal carotid arteries. Skull: Normal Sinuses/Orbits: Clear sinuses. No fluid in the middle ears or mastoids. Orbits negative. Other: None IMPRESSION: No acute finding. The patient does have advanced atherosclerotic disease with dolichoectasia of the intracranial vessels, including dilatation of either the distal vertebral artery or the proximal basilar artery to a diameter of 14 mm. Given this, the possibility of acute posterior circulation ischemia should be considered given this presentation, though there is no discrete CT finding presently. Electronically Signed   By: Nelson Chimes M.D.   On: 08/13/2017 14:42   Mr Jodene Nam Head Wo Contrast  Result Date: 08/13/2017 CLINICAL DATA:  Dizziness. EXAM: MRI HEAD WITHOUT CONTRAST MRA HEAD WITHOUT CONTRAST TECHNIQUE: Multiplanar, multiecho pulse sequences of the brain and surrounding structures were obtained without intravenous contrast. Angiographic images of the head were obtained using MRA technique without contrast. COMPARISON:  Head CT 08/13/2017 FINDINGS: MRI HEAD FINDINGS BRAIN: There is mild mass effect on the right ventral aspect of the  brainstem by the ectatic/aneurysmal basilar artery. There is a diffusion abnormality adjacent to the medulla oblongata that is likely secondary to the adjacent basilar artery. There is no acute infarct, acute hemorrhage or mass. 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 Minimal periventricular white matter hyperintensity. Generalized atrophy without lobar predilection. Blood-sensitive sequences show no chronic microhemorrhage or superficial siderosis. SKULL AND UPPER CERVICAL SPINE: The visualized skull base, calvarium, upper cervical spine and extracranial soft tissues are normal. SINUSES/ORBITS: No fluid levels or advanced mucosal thickening. No mastoid or middle ear effusion. The orbits are normal. MRA HEAD FINDINGS The MRA portion of the exam is severely degraded by motion. 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 The distal basilar artery is tortuous,  but poorly characterized on this study. There is inadequate visualization of the circle-of-Willis. IMPRESSION: 1. 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 The time-of-flight MRA is severely motion degraded. CT or catheter angiography would provide better characterization of the aneurysm, as clinically indicated. 2. No acute infarct. Diffusion abnormality along the right ventral aspect of the medulla oblongata is likely an artifact of the enlarged basilar artery. 3. 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 Electronically Signed   By: Ulyses Jarred M.D.   On: 08/13/2017 19:55   Mr Brain Wo Contrast  Result Date: 08/13/2017 CLINICAL DATA:  Dizziness. EXAM: MRI HEAD WITHOUT CONTRAST MRA HEAD WITHOUT CONTRAST TECHNIQUE: Multiplanar, multiecho pulse sequences of the brain and surrounding structures were obtained without intravenous contrast. Angiographic images of the head were obtained using MRA technique without contrast.  COMPARISON:  Head CT 08/13/2017 FINDINGS: MRI HEAD FINDINGS BRAIN: There is mild mass effect on the right ventral aspect of the brainstem by the ectatic/aneurysmal basilar artery. There is a diffusion abnormality adjacent to the medulla oblongata that is likely secondary to the adjacent basilar artery. There is no acute infarct, acute hemorrhage or mass. 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 Minimal periventricular white matter hyperintensity. Generalized atrophy without lobar predilection. Blood-sensitive sequences show no chronic microhemorrhage or superficial siderosis. SKULL AND UPPER CERVICAL SPINE: The visualized skull base, calvarium, upper cervical spine and extracranial soft tissues are normal. SINUSES/ORBITS: No fluid levels or advanced mucosal thickening. No mastoid or middle ear effusion. The orbits are normal. MRA HEAD FINDINGS The MRA portion of the exam is severely degraded by motion. 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 The distal basilar artery is tortuous, but poorly characterized on this study. There is inadequate visualization of the circle-of-Willis. IMPRESSION: 1. 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 The time-of-flight MRA is severely motion degraded. CT or catheter angiography would provide better characterization of the aneurysm, as clinically indicated. 2. No acute infarct. Diffusion abnormality along the right ventral aspect of the medulla oblongata is likely an artifact of the enlarged basilar artery. 3. 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 Electronically Signed   By: Ulyses Jarred M.D.   On: 08/13/2017 19:55   Dg Chest Port 1 View  Result Date: 08/13/2017 CLINICAL DATA:  Acute presentation with dizziness and chest pain. EXAM: PORTABLE CHEST 1 VIEW COMPARISON:  09/30/2013 FINDINGS: Poor inspiration. The upper lungs are clear.  Abnormal density in both lower lobes could relate to the poor inspiration or there could be lower lobe atelectasis or pneumonia. No dense consolidation or lobar collapse. No effusions. No abnormal bone finding. Heart size appears normal. There is aortic atherosclerosis. IMPRESSION: Poor inspiration. Increased markings at the lung bases may be secondary to that. The possibility of lower lobe atelectasis or pneumonia does exist based on this exam. Electronically Signed   By: Nelson Chimes M.D.   On: 08/13/2017 14:38    Assessment: 82 y.o. male presenting with dizziness.  Dizziness has resolved today but patient has now developed left sided weakness.  MRI of the brain performed on yesterday shows likely artifact from partially thrombosed basilar aneurysm.  Acute infarct remains in the differential.   Echocardiogram and carotid dopplers are pending.    Stroke Risk Factors - hypertension  Plan: 1. Repeat MRI of the brain to be performed STAT.  Based on results will contact IR at [facility] for further recommendations and need for transfer 2. NPO until RN stroke swallow screen 3. Telemetry monitoring 4. Frequent neuro checks  Case discussed with Dr. Samuella Cota, MD Neurology [phone] 08/14/2017, 9:30 AM  Addendum: Repeat MRI Of the brain reviewed and shows an acute right medullary infarct.  Aneurysm unchanged.  Spoke with IR at [facility] and they have suggested follow up 2-3 weeks post discharge.    Recommendations: 1. Echocardiogram and carotid dopplers are pending 2. A1c and lipid panel 3. PT consult, OT consult and speech therapy consults 4. ASA 325mg  daily   Alexis Goodell, MD Neurology [phone]

## 2017-08-14 NOTE — Plan of Care (Signed)
  Problem: Education: Goal: Knowledge of General Education information will improve Description Including pain rating scale, medication(s)/side effects and non-pharmacologic comfort measures Outcome: Progressing   Problem: Clinical Measurements: Goal: Ability to maintain clinical measurements within normal limits will improve Outcome: Progressing Goal: Will remain free from infection Outcome: Progressing   Problem: Nutrition: Goal: Adequate nutrition will be maintained Outcome: Progressing   Problem: Skin Integrity: Goal: Risk for impaired skin integrity will decrease Outcome: Progressing   Problem: Nutrition: Goal: Risk of aspiration will decrease Outcome: Progressing

## 2017-08-14 NOTE — Care Management Obs Status (Signed)
[place] NOTIFICATION   Patient Details  Name: TORBEN SOLOWAY MRN: [id number] Date of Birth: Jul 10, 1935   Medicare Observation Status Notification Given:  Yes : Discussed with wife, Talmage Coin, RN 08/14/2017, 9:09 AM

## 2017-08-14 NOTE — Progress Notes (Signed)
*  PRELIMINARY RESULTS* Echocardiogram 2D Echocardiogram has been performed.  Sherrie Sport 08/14/2017, 3:40 PM

## 2017-08-14 NOTE — Care Management Note (Addendum)
Case Management Note  Patient Details  Name: [name] MRN: [id number] Date of Birth: [birth year]  Subjective/Objective:  Admitted to [facility] under observation status with the diagnosis of renal failure. Lives with wife, Fara Olden. [phone]). Prescriptions are filled at [facility] in [place].  No home health. No skilled facility. No home oxygen. Rolling walker and cane in the home. Takes care of all basic activities of daily living himself, drives. Last fall was a few weeks ago. Good appetite.                   Action/Plan: Will continue to follow for discharge plans,  Family at the bedside.   Expected Discharge Date:                  Expected Discharge Plan:     In-House Referral:   yes  Discharge planning Services   yes  Post Acute Care Choice:    Choice offered to:     DME Arranged:    DME Agency:     HH Arranged:    HH Agency:     Status of Service:     If discussed at [employer] of Stay Meetings, dates discussed:    Additional Comments:  Shelbie Ammons, RN MSN CCM Care Management [phone] 08/14/2017, 8:15 AM

## 2017-08-14 NOTE — Progress Notes (Signed)
Chaplain visited the family at the request of patient's nurse. Patient had a stoke and the family was at the bedside awaiting an update. Patient was resting and anxiously awaiting his dinner delivery. Wife said that their pastor had visited them this morning and that they have very good support from their church family.    08/14/17 1700  Clinical Encounter Type  Visited With Family  Visit Type Initial  Referral From Nurse

## 2017-08-14 NOTE — Progress Notes (Signed)
OT Cancellation Note  Patient Details Name: [name] MRN: [id number] DOB: 04-04-1935   Cancelled Treatment:    Reason Eval/Treat Not Completed: Patient at procedure or test/ unavailable. Order received, chart reviewed. Pt out of room for testing. Will re-attempt OT evaluation at later date/time as pt is available and medically appropriate.  Jeni Salles, MPH, MS, OTR/L ascom [phone] 08/14/17, 2:52 PM

## 2017-08-14 NOTE — Plan of Care (Signed)

## 2017-08-14 NOTE — Progress Notes (Addendum)
[facility] [place] NAME: [name]    MR#:  [id number]  DATE OF BIRTH:  [birth year]  SUBJECTIVE:  CHIEF COMPLAINT:   Chief Complaint  Patient presents with  . Dizziness   - patient admitted with dizziness and developed left sided weakness overnight - repeat MRI positive for stroke  REVIEW OF SYSTEMS:  Review of Systems  Constitutional: Positive for malaise/fatigue. Negative for chills and fever.  HENT: Negative for ear discharge, hearing loss and nosebleeds.   Eyes: Negative for blurred vision and double vision.  Respiratory: Negative for cough, shortness of breath and wheezing.   Cardiovascular: Negative for chest pain and palpitations.  Gastrointestinal: Negative for abdominal pain, constipation, diarrhea, nausea and vomiting.  Genitourinary: Negative for dysuria.  Neurological: Positive for dizziness and focal weakness. Negative for sensory change, speech change, seizures and headaches.  Psychiatric/Behavioral: Negative for depression.    DRUG ALLERGIES:   Allergies  Allergen Reactions  . Ace Inhibitors     Other reaction(s): Other (See Comments) Renal failure.  . Isosorbide Nitrate     Other reaction(s): Headache Stomach ache,nausea  . Nsaids     Other reaction(s): Unknown  . Prednisone Other (See Comments)    Feels like pins/needles/sharp stick on the inside    VITALS:  Blood pressure 123/81, pulse 67, temperature 98.2 F (36.8 C), temperature source Axillary, resp. rate 18, height 6' (1.829 m), weight 86.2 kg (190 lb), SpO2 98 %.  PHYSICAL EXAMINATION:  Physical Exam   GENERAL:  82 y.o.-year-old patient lying in the bed with no acute distress.  EYES: Pupils equal, round, reactive to light and accommodation. No scleral icterus. Extraocular muscles intact.  HEENT: Head atraumatic, normocephalic. Oropharynx and nasopharynx clear.  NECK:  Supple, no jugular venous distention. No thyroid enlargement, no tenderness.    LUNGS: Normal breath sounds bilaterally, no wheezing, rales,rhonchi or crepitation. No use of accessory muscles of respiration. Decreased basilar breath sounds CARDIOVASCULAR: S1, S2 normal. No  rubs, or gallops. 2/6 systolic murmur present ABDOMEN: Soft, nontender, nondistended. Bowel sounds present. No organomegaly or mass.  EXTREMITIES: No pedal edema, cyanosis, or clubbing.  NEUROLOGIC: Cranial nerves II through XII are intact. No facial droop noted. Sensation intact. LUE is 1/5 and LLE is 4/5 weakness Gait not checked.  PSYCHIATRIC: The patient is alert and oriented x 2-3.  SKIN: No obvious rash, lesion, or ulcer.    LABORATORY PANEL:   CBC Recent Labs  Lab 08/13/17 1344  WBC 6.9  HGB 12.5*  HCT 36.9*  PLT 184   ------------------------------------------------------------------------------------------------------------------  Chemistries  Recent Labs  Lab 08/13/17 1344 08/14/17 0330  NA 139 139  K 4.5 5.1  CL 112* 113*  CO2 21* 22  GLUCOSE 123* 103*  BUN 48* 47*  CREATININE 3.96* 3.72*  CALCIUM 8.7* 8.5*  MG 2.1  --   AST 15  --   ALT 12  --   ALKPHOS 112  --   BILITOT 0.5  --    ------------------------------------------------------------------------------------------------------------------  Cardiac Enzymes Recent Labs  Lab 08/13/17 1344  TROPONINI <0.03   ------------------------------------------------------------------------------------------------------------------  RADIOLOGY:  Ct Head Wo Contrast  Result Date: 08/13/2017 CLINICAL DATA:  Acute presentation with severe dizziness. EXAM: CT HEAD WITHOUT CONTRAST TECHNIQUE: Contiguous axial images were obtained from the base of the skull through the vertex without intravenous contrast. COMPARISON:  None. FINDINGS: Brain: Generalized atrophy. No sign of acute infarction, mass lesion, hemorrhage, hydrocephalus or extra-axial collection. Vascular: Atherosclerotic disease with severe  dolichoectasia of the  vertebrobasilar system. Vessel diameter up to 14 mm in diameter either at the distal vertebral or proximal basilar artery. Lesser ectasia of the supraclinoid internal carotid arteries. Skull: Normal Sinuses/Orbits: Clear sinuses. No fluid in the middle ears or mastoids. Orbits negative. Other: None IMPRESSION: No acute finding. The patient does have advanced atherosclerotic disease with dolichoectasia of the intracranial vessels, including dilatation of either the distal vertebral artery or the proximal basilar artery to a diameter of 14 mm. Given this, the possibility of acute posterior circulation ischemia should be considered given this presentation, though there is no discrete CT finding presently. Electronically Signed   By: Nelson Chimes M.D.   On: 08/13/2017 14:42   Mr Jodene Nam Head Wo Contrast  Result Date: 08/13/2017 CLINICAL DATA:  Dizziness. EXAM: MRI HEAD WITHOUT CONTRAST MRA HEAD WITHOUT CONTRAST TECHNIQUE: Multiplanar, multiecho pulse sequences of the brain and surrounding structures were obtained without intravenous contrast. Angiographic images of the head were obtained using MRA technique without contrast. COMPARISON:  Head CT 08/13/2017 FINDINGS: MRI HEAD FINDINGS BRAIN: There is mild mass effect on the right ventral aspect of the brainstem by the ectatic/aneurysmal basilar artery. There is a diffusion abnormality adjacent to the medulla oblongata that is likely secondary to the adjacent basilar artery. There is no acute infarct, acute hemorrhage or mass. 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 Minimal periventricular white matter hyperintensity. Generalized atrophy without lobar predilection. Blood-sensitive sequences show no chronic microhemorrhage or superficial siderosis. SKULL AND UPPER CERVICAL SPINE: The visualized skull base, calvarium, upper cervical spine and extracranial soft tissues are normal. SINUSES/ORBITS: No fluid levels or advanced mucosal thickening. No mastoid or middle ear  effusion. The orbits are normal. MRA HEAD FINDINGS The MRA portion of the exam is severely degraded by motion. 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 The distal basilar artery is tortuous, but poorly characterized on this study. There is inadequate visualization of the circle-of-Willis. IMPRESSION: 1. 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 The time-of-flight MRA is severely motion degraded. CT or catheter angiography would provide better characterization of the aneurysm, as clinically indicated. 2. No acute infarct. Diffusion abnormality along the right ventral aspect of the medulla oblongata is likely an artifact of the enlarged basilar artery. 3. 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 Electronically Signed   By: Ulyses Jarred M.D.   On: 08/13/2017 19:55   Mr Brain Wo Contrast  Result Date: 08/14/2017 CLINICAL DATA:  82 year old male with partially thrombosed basilar artery aneurysm, suspected artifactual abnormal diffusion signal in the right medulla on brain MRI yesterday. Increased left side weakness over night. EXAM: MRI HEAD WITHOUT CONTRAST TECHNIQUE: Multiplanar, multiecho pulse sequences of the brain and surrounding structures were obtained without intravenous contrast. COMPARISON:  Brain MRI and intracranial MRA 08/13/2017. FINDINGS: Brain: Progressed and now intense wedge-shaped restricted diffusion in the right medulla affecting the right pyramid on series 100, image 15. There is faint corresponding T2 and FLAIR hyperintensity. No associated hemorrhage or mass effect. No other restricted diffusion. Stable mass effect on the right brainstem related to the abnormal vertebrobasilar arteries. No midline shift, ventriculomegaly, extra-axial collection or acute intracranial hemorrhage. Pituitary within normal limits. No new signal abnormality elsewhere. Small  cortical and subcortical white matter encephalomalacia in the right superior frontal gyrus. Tiny chronic lacunar infarcts in the left cerebellum. No chronic cerebral blood products. Vascular: Fusiform  aneurysmal vertebrobasilar junction redemonstrated measuring up to 15 millimeters diameter appears stable since yesterday with partial thrombosis (series 7, image 7), a especially affecting the distal left vertebral artery. Mild to moderate generalized intracranial artery dolichoectasia otherwise. Major intracranial vascular flow voids are stable. Skull and upper cervical spine: Stable, negative. Sinuses/Orbits: Stable, negative. Other: Visible internal auditory structures appear normal. Mastoids remain clear. Scalp and face soft tissues appear negative. IMPRESSION: 1. Evolving Acute Right Medullary Infarct at the level of the right medullary pyramid. This was thought to be artifact on yesterday's MRI. No associated hemorrhage or mass effect. 2. 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 Underlying generalized intracranial artery dolichoectasia. 3. Small chronic infarcts in the right MCA and left PICA territories. Electronically Signed   By: Genevie Ann M.D.   On: 08/14/2017 11:16   Mr Brain Wo Contrast  Result Date: 08/13/2017 CLINICAL DATA:  Dizziness. EXAM: MRI HEAD WITHOUT CONTRAST MRA HEAD WITHOUT CONTRAST TECHNIQUE: Multiplanar, multiecho pulse sequences of the brain and surrounding structures were obtained without intravenous contrast. Angiographic images of the head were obtained using MRA technique without contrast. COMPARISON:  Head CT 08/13/2017 FINDINGS: MRI HEAD FINDINGS BRAIN: There is mild mass effect on the right ventral aspect of the brainstem by the ectatic/aneurysmal basilar artery. There is a diffusion abnormality adjacent to the medulla oblongata that is likely secondary to the adjacent basilar artery. There is no acute infarct,  acute hemorrhage or mass. 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 Minimal periventricular white matter hyperintensity. Generalized atrophy without lobar predilection. Blood-sensitive sequences show no chronic microhemorrhage or superficial siderosis. SKULL AND UPPER CERVICAL SPINE: The visualized skull base, calvarium, upper cervical spine and extracranial soft tissues are normal. SINUSES/ORBITS: No fluid levels or advanced mucosal thickening. No mastoid or middle ear effusion. The orbits are normal. MRA HEAD FINDINGS The MRA portion of the exam is severely degraded by motion. 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 The distal basilar artery is tortuous, but poorly characterized on this study. There is inadequate visualization of the circle-of-Willis. IMPRESSION: 1. 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 The time-of-flight MRA is severely motion degraded. CT or catheter angiography would provide better characterization of the aneurysm, as clinically indicated. 2. No acute infarct. Diffusion abnormality along the right ventral aspect of the medulla oblongata is likely an artifact of the enlarged basilar artery. 3. 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 Electronically Signed   By: Ulyses Jarred M.D.   On: 08/13/2017 19:55   Dg Chest Port 1 View  Result Date: 08/13/2017 CLINICAL DATA:  Acute presentation with dizziness and chest pain. EXAM: PORTABLE CHEST 1 VIEW COMPARISON:  09/30/2013 FINDINGS: Poor inspiration. The upper lungs are clear. Abnormal density in both lower lobes could relate to the poor inspiration or there could be lower lobe atelectasis or pneumonia. No dense consolidation or lobar collapse. No effusions. No abnormal bone finding. Heart size appears normal. There is aortic atherosclerosis. IMPRESSION: Poor inspiration. Increased markings at the lung  bases may be secondary to that. The possibility of lower lobe atelectasis or pneumonia does exist based on this exam. Electronically Signed   By: Nelson Chimes M.D.   On: 08/13/2017 14:38    EKG:  No orders found for this or any previous visit.  ASSESSMENT AND PLAN:   82 y/o Male with PMH  of CKD stage 4, Hy[pertension, CAD, aortic stenosis, peripheral neuropathy presents from home connected dizziness.  1.  Acute stroke-patient presented with dizziness, overnight developed left upper and lower leg weakness.  Unsure exact time.  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  Radiology was contacted by neurology-did not recommend any procedures at this time. -We will get carotid Dopplers, neurochecks, echo with bubble study -Neurology consult is appreciated -Start aspirin and statin. -PT/OT and speech consults requested  2.  CKD stage IV-creatinine is stable.  Potassium slightly elevated.  Monitor urine output.  If worsens, will consider nephrology consult  3.  Hypertension-history of hypertension.  On low-dose Toprol which we will continue at this time  4.  GERD-Protonix  5.  DVT prophylaxis-subcutaneous heparin   Discussed with neurology.  Also family updated in person throughout the day  All the records are reviewed and case discussed with Care Management/Social Workerr. Management plans discussed with the patient, family and they are in agreement.  CODE STATUS: Full Code  TOTAL CRITICAL CARE TIME SPENT IN TAKING CARE OF THIS PATIENT: 42 minutes.   POSSIBLE D/C IN 2 DAYS, DEPENDING ON CLINICAL CONDITION.   Gladstone Lighter M.D on 08/14/2017 at 1:44 PM  Between 7am to 6pm - Pager - [phone]  After 6pm go to [email or web address] - password [email or web address] [facility]    Office  [phone]  CC: Primary care physician; Rusty Aus, MD

## 2017-08-14 NOTE — Progress Notes (Signed)
Patient passed the swallow test. He swallowed his medication and food without problem. No reflective cough. Will continue to monitor patient.

## 2017-08-15 LAB — BASIC METABOLIC PANEL
ANION GAP: 4 — AB (ref 5–15)
BUN: 44 mg/dL — ABNORMAL HIGH (ref 8–23)
CHLORIDE: 113 mmol/L — AB (ref 98–111)
CO2: 22 mmol/L (ref 22–32)
Calcium: 8.5 mg/dL — ABNORMAL LOW (ref 8.9–10.3)
Creatinine, Ser: 3.77 mg/dL — ABNORMAL HIGH (ref 0.61–1.24)
GFR calc non Af Amer: 14 mL/min — ABNORMAL LOW (ref >60)
GFR, EST AFRICAN AMERICAN: 16 mL/min — AB (ref >60)
Glucose, Bld: 97 mg/dL (ref 70–99)
POTASSIUM: 5.1 mmol/L (ref 3.5–5.1)
Sodium: 139 mmol/L (ref 135–145)

## 2017-08-15 LAB — CBC
HEMATOCRIT: 32.7 % — AB (ref 40.0–52.0)
HEMOGLOBIN: 11.2 g/dL — AB (ref 13.0–18.0)
MCH: 29 pg (ref 26.0–34.0)
MCHC: 34.3 g/dL (ref 32.0–36.0)
MCV: 84.4 fL (ref 80.0–100.0)
Platelets: 165 10*3/uL (ref 150–440)
RBC: 3.88 MIL/uL — AB (ref 4.40–5.90)
RDW: 16.5 % — ABNORMAL HIGH (ref 11.5–14.5)
WBC: 6.3 10*3/uL (ref 3.8–10.6)

## 2017-08-15 LAB — LIPID PANEL
CHOL/HDL RATIO: 6.5 ratio
CHOLESTEROL: 157 mg/dL (ref 0–200)
HDL: 24 mg/dL — ABNORMAL LOW (ref >40)
LDL Cholesterol: 92 mg/dL (ref 0–99)
TRIGLYCERIDES: 204 mg/dL — AB (ref <150)
VLDL: 41 mg/dL — AB (ref 0–40)

## 2017-08-15 MED ORDER — STROKE: EARLY STAGES OF RECOVERY BOOK
Freq: Once | Status: AC
  Administered 2017-08-15: 07:00:00

## 2017-08-15 MED ORDER — CALCITRIOL 0.25 MCG PO CAPS
0.2500 ug | ORAL_CAPSULE | ORAL | Status: DC
  Administered 2017-08-16 – 2017-08-19 (×2): 0.25 ug via ORAL
  Filled 2017-08-15 (×2): qty 1

## 2017-08-15 NOTE — Progress Notes (Signed)
Boil noted to patient's buttock. MD notified. New orders for hibiclens and warm compresses q shift.

## 2017-08-15 NOTE — Evaluation (Addendum)
Physical Therapy Evaluation Patient Details Name: [name] MRN: [id number] DOB: [birth year] Today's Date: 08/15/2017   History of Present Illness  Pt is 82 y.o. 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  Clinical Impression  Pt is a pleasant 82 year old male who was admitted for acute on chronic renal failure and then found to have Medullary CVA with left sided weakness. Pt performed bed mobility scooting with Mod A x2, but went supine<>sit with Min Ax1, transferred sit<>stand with Mod A x2 see below for specifics. Ambualtion was deferred secondary to increased dizziness with standing. Pt demonstrates deficits with functional mobility secondary to L sided weakness, diminished  Light touch sensation/coordination, poor motor planning, and activity intolerance. Pt would benefit from skilled PT to address above deficits and promote optimal return to PLOF. Pt was very eager to work with PT and excited to get OOB, and showed a good ability to track to affected side and use unaffected UE to aid with the affected L side. PT recommends d/c to CIR at this time secondary to increased deficits with a high willingness to participate in PT.      Follow Up Recommendations CIR    Equipment Recommendations  Other (comment)(per family have RW, 3 in 1, and cane.)    Recommendations for Other Services       Precautions / Restrictions Restrictions Weight Bearing Restrictions: No      Mobility  Bed Mobility Overal bed mobility: Needs Assistance Bed Mobility: Supine to Sit     Supine to sit: Mod assist;+2 for physical assistance(Mod a x2 was for scooting, Min A for supine<>sit)      General bed mobility comments: Pt reqiured Min A at LE and trunk for Supine<>sit secondary to weakness, and poor motor planning, and control of L UE/LE. Pt required Mod A x2 to scoot up in bed for the same deficits mentioned for supine to sit.  Transfers Overall transfer level: Needs assistance Equipment used: 2 person hand held assist Transfers: Sit to/from Stand Sit to Stand: Mod assist;+2 physical assistance         General transfer comment: Pt required Mod A x2 at gait belt with B hand hold, and blocking of L LE to limite knee buckling and ER. In standing pt had L side lean that improve dwith Min A to mid line, but pt felt like he was not at midline "pushing me over" to right. Pt was able to bring R foot under neath COM by clearing from floor but required PT Min-Mod A at L knee to prevent buckling.  Ambulation/Gait Ambulation/Gait assistance: (Deffered ambulation secondary to increased dizziness)              Stairs            Wheelchair Mobility    Modified Rankin (Stroke Patients Only)       Balance Overall balance assessment: Needs assistance Sitting-balance support: Bilateral upper extremity supported;Feet supported Sitting balance-Leahy Scale: Fair Sitting balance - Comments: L lateral trunk elongation, excessive L lateral weight shift Postural control: Right lateral lean Standing balance support: Bilateral upper extremity supported Standing balance-Leahy Scale: Poor Standing balance comment: pt weight shifts to L, mild pusher towards L  Pertinent Vitals/Pain Pain Assessment: No/denies pain    Home Living Family/patient expects to be discharged to:: Private residence Living Arrangements: Spouse/significant other Available Help at Discharge: Family(wife is limited in physical assist, other family intermitent) Type of Home: House Home Access: Stairs to enter Entrance Stairs-Rails: Left Entrance Stairs-Number of  Steps: 1 Home Layout: One level Home Equipment: Walker - 2 wheels;Walker - 4 wheels;Cane - quad;Bedside commode      Prior Function Level of Independence: Independent         Comments: communtiy ammbulator     Hand Dominance   Dominant Hand: Right    Extremity/Trunk Assessment   Upper Extremity Assessment Upper Extremity Assessment: LUE deficits/detail LUE Deficits / Details: Pt has diminished gross motor function and grossly 2/5 MMT LUE Sensation: decreased light touch LUE Coordination: decreased gross motor;decreased fine motor    Lower Extremity Assessment Lower Extremity Assessment: LLE deficits/detail LLE Deficits / Details: Gross  strength defict 3/5 MMT in supine. Gross motor function deficits and coordination deficits. LLE Sensation: decreased light touch LLE Coordination: decreased fine motor;decreased gross motor    Cervical / Trunk Assessment Cervical / Trunk Assessment: Other exceptions Cervical / Trunk Exceptions: L lateral trunk elongation with associated downward tilt of L pelvis, upward rotation of L scapular and L latearl trunk lean/weight shift; improved with bolster placed under L IT for facilitation of neutral pelvic alignment  Communication   Communication: Expressive difficulties(slurred speech)  Cognition Arousal/Alertness: Awake/alert Behavior During Therapy: WFL for tasks assessed/performed Overall Cognitive Status: Within Functional Limits for tasks assessed                                        General Comments      Exercises     Assessment/Plan    PT Assessment Patient needs continued PT services  PT Problem List Decreased strength;Decreased activity tolerance;Decreased balance;Decreased mobility;Decreased coordination;Decreased knowledge of use of DME;Decreased safety awareness;Impaired sensation       PT Treatment Interventions DME instruction;Gait training;Stair training;Functional mobility training;Therapeutic  activities;Therapeutic exercise;Balance training;Neuromuscular re-education;Patient/family education;Wheelchair mobility training    PT Goals (Current goals can be found in the Care Plan section)  Acute Rehab PT Goals Patient Stated Goal: return to PLOF PT Goal Formulation: With patient Time For Goal Achievement: 08/29/17 Potential to Achieve Goals: Fair    Frequency 7X/week   Barriers to discharge Decreased caregiver support      Co-evaluation               AM-PAC PT "6 Clicks" Daily Activity  Outcome Measure Difficulty turning over in bed (including adjusting bedclothes, sheets and blankets)?: Unable Difficulty moving from lying on back to sitting on the side of the bed? : Unable Difficulty sitting down on and standing up from a chair with arms (e.g., wheelchair, bedside commode, etc,.)?: Unable Help needed moving to and from a bed to chair (including a wheelchair)?: Total Help needed walking in hospital room?: Total Help needed climbing 3-5 steps with a railing? : Total 6 Click Score: 6    End of Session Equipment Utilized During Treatment: Gait belt Activity Tolerance: Other (comment)(pt limited by increased dizziness) Patient left: in bed;with call bell/phone within reach;with bed alarm set;with family/visitor present Nurse Communication: Mobility status PT Visit Diagnosis: Unsteadiness on feet (R26.81);Other abnormalities of gait and mobility (R26.89);Ataxic gait (R26.0);Difficulty in walking, not elsewhere classified (R26.2);Apraxia (R48.2);Hemiplegia and hemiparesis Hemiplegia - Right/Left: Left Hemiplegia -  dominant/non-dominant: Non-dominant Hemiplegia - caused by: Cerebral infarction    Time: 5992-3414 PT Time Calculation (min) (ACUTE ONLY): 30 min   Charges:              Rosario Adie, SPT   Rosario Adie 08/15/2017, 4:33 PM     Dilyn Osoria H. Owens Shark, PT, DPT, NCS 08/15/17, 9:30 PM [phone]

## 2017-08-15 NOTE — Progress Notes (Signed)
[facility]  ROUNDING NOTE   Subjective:  Patient well-known to [place].  Next line with follow him for outpatient chronic kidney disease stage IV. 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 Current EGFR is 14.   Objective:  Vital signs in last 24 hours:  Temp:  [97.8 F (36.6 C)-98.9 F (37.2 C)] 98.2 F (36.8 C) (08/01 1228) Pulse Rate:  [64-71] 64 (08/01 1228) Resp:  [8-20] 18 (08/01 1228) BP: (118-144)/(63-92) 118/75 (08/01 1228) SpO2:  [94 %-98 %] 94 % (08/01 1228)  Weight change:  Filed Weights   08/13/17 1345  Weight: 86.2 kg (190 lb)    Intake/Output: I/O last 3 completed shifts: In: 1696.3 [P.O.:600; I.V.:1096.3] Out: 551 [Urine:551]   Intake/Output this shift:  Total I/O In: 50 [P.O.:50] Out: -   Physical Exam: General: No acute distress  Head: Normocephalic, atraumatic. Moist oral mucosal membranes  Eyes: Anicteric  Neck: Supple, trachea midline  Lungs:  Clear to auscultation, normal effort  Heart: S1S2 no rubs  Abdomen:  Soft, nontender, bowel sounds present  Extremities: Trace peripheral edema.  Neurologic: Left sided weakness noted, awake, alert  Skin: No lesions       Basic Metabolic Panel: Recent Labs  Lab 08/13/17 1344 08/14/17 0330 08/15/17 0410  NA 139 139 139  K 4.5 5.1 5.1  CL 112* 113* 113*  CO2 21* 22 22  GLUCOSE 123* 103* 97  BUN 48* 47* 44*  CREATININE 3.96* 3.72* 3.77*  CALCIUM 8.7* 8.5* 8.5*  MG 2.1  --   --     Liver Function Tests: Recent Labs  Lab 08/13/17 1344  AST 15  ALT 12  ALKPHOS 112  BILITOT 0.5  PROT 7.3  ALBUMIN 4.0   No results for input(s): LIPASE, AMYLASE in the last 168 hours. No results for input(s): AMMONIA in the last 168 hours.  CBC: Recent Labs  Lab 08/13/17 1344 08/15/17 0410  WBC 6.9 6.3  NEUTROABS 4.9  --   HGB 12.5* 11.2*  HCT 36.9* 32.7*  MCV 84.9 84.4  PLT 184 165    Cardiac Enzymes: Recent Labs  Lab 08/13/17 1344  TROPONINI  <0.03    BNP: Invalid input(s): POCBNP  CBG: No results for input(s): GLUCAP in the last 168 hours.  Microbiology: No results found for this or any previous visit.  Coagulation Studies: No results for input(s): LABPROT, INR in the last 72 hours.  Urinalysis: Recent Labs    08/13/17 1442  COLORURINE YELLOW*  LABSPEC 1.012  PHURINE 5.0  GLUCOSEU NEGATIVE  HGBUR NEGATIVE  BILIRUBINUR NEGATIVE  KETONESUR NEGATIVE  PROTEINUR 100*  NITRITE NEGATIVE  LEUKOCYTESUR NEGATIVE      Imaging: Ct Head Wo Contrast  Result Date: 08/13/2017 CLINICAL DATA:  Acute presentation with severe dizziness. EXAM: CT HEAD WITHOUT CONTRAST TECHNIQUE: Contiguous axial images were obtained from the base of the skull through the vertex without intravenous contrast. COMPARISON:  None. FINDINGS: Brain: Generalized atrophy. No sign of acute infarction, mass lesion, hemorrhage, hydrocephalus or extra-axial collection. 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 Vessel diameter up to 14 mm in diameter either at the distal vertebral or proximal basilar artery. Lesser ectasia of the supraclinoid internal carotid arteries. Skull: Normal Sinuses/Orbits: Clear sinuses. No fluid in the middle ears or mastoids. Orbits negative. Other: None IMPRESSION: No acute finding. The patient does have advanced atherosclerotic disease with dolichoectasia of the intracranial vessels, including dilatation of either the distal vertebral artery or  the proximal basilar artery to a diameter of 14 mm. Given this, the possibility of acute posterior circulation ischemia should be considered given this presentation, though there is no discrete CT finding presently. Electronically Signed   By: Nelson Chimes M.D.   On: 08/13/2017 14:42   Mr Jodene Nam Head Wo Contrast  Result Date: 08/13/2017 CLINICAL DATA:  Dizziness. EXAM: MRI HEAD WITHOUT CONTRAST MRA HEAD WITHOUT CONTRAST TECHNIQUE: Multiplanar,  multiecho pulse sequences of the brain and surrounding structures were obtained without intravenous contrast. Angiographic images of the head were obtained using MRA technique without contrast. COMPARISON:  Head CT 08/13/2017 FINDINGS: MRI HEAD FINDINGS BRAIN: There is mild mass effect on the right ventral aspect of the brainstem by the ectatic/aneurysmal basilar artery. There is a diffusion abnormality adjacent to the medulla oblongata that is likely secondary to the adjacent basilar artery. There is no acute infarct, acute hemorrhage or mass. 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 Minimal periventricular white matter hyperintensity. Generalized atrophy without lobar predilection. Blood-sensitive sequences show no chronic microhemorrhage or superficial siderosis. SKULL AND UPPER CERVICAL SPINE: The visualized skull base, calvarium, upper cervical spine and extracranial soft tissues are normal. SINUSES/ORBITS: No fluid levels or advanced mucosal thickening. No mastoid or middle ear effusion. The orbits are normal. MRA HEAD FINDINGS The MRA portion of the exam is severely degraded by motion. 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 The distal basilar artery is tortuous, but poorly characterized on this study. There is inadequate visualization of the circle-of-Willis. IMPRESSION: 1. 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 The time-of-flight MRA is severely motion degraded. CT or catheter angiography would provide better characterization of the aneurysm, as clinically indicated. 2. No acute infarct. Diffusion abnormality along the right ventral aspect of the medulla oblongata is likely an artifact of the enlarged basilar artery. 3. 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 Electronically Signed   By: Ulyses Jarred M.D.   On: 08/13/2017 19:55   Mr Brain Wo Contrast  Result  Date: 08/14/2017 CLINICAL DATA:  82 year old male with partially thrombosed basilar artery aneurysm, suspected artifactual abnormal diffusion signal in the right medulla on brain MRI yesterday. Increased left side weakness over night. EXAM: MRI HEAD WITHOUT CONTRAST TECHNIQUE: Multiplanar, multiecho pulse sequences of the brain and surrounding structures were obtained without intravenous contrast. COMPARISON:  Brain MRI and intracranial MRA 08/13/2017. FINDINGS: Brain: Progressed and now intense wedge-shaped restricted diffusion in the right medulla affecting the right pyramid on series 100, image 15. There is faint corresponding T2 and FLAIR hyperintensity. No associated hemorrhage or mass effect. No other restricted diffusion. Stable mass effect on the right brainstem related to the abnormal vertebrobasilar arteries. No midline shift, ventriculomegaly, extra-axial collection or acute intracranial hemorrhage. Pituitary within normal limits. No new signal abnormality elsewhere. Small cortical and subcortical white matter encephalomalacia in the right superior frontal gyrus. Tiny chronic lacunar infarcts in the left cerebellum. No chronic cerebral blood products. Vascular: Fusiform aneurysmal vertebrobasilar junction redemonstrated measuring up to 15 millimeters diameter appears stable since yesterday with partial thrombosis (series 7, image 7), a especially affecting the distal left vertebral artery. Mild to moderate generalized intracranial artery dolichoectasia otherwise. Major intracranial vascular flow voids are stable. Skull and upper cervical spine: Stable, negative. Sinuses/Orbits: Stable, negative. Other: Visible internal auditory structures appear normal. Mastoids remain clear. Scalp and face soft tissues appear negative. IMPRESSION: 1. Evolving  Acute Right Medullary Infarct at the level of the right medullary pyramid. This was thought to be artifact on yesterday's MRI. No associated hemorrhage or mass  effect. 2. 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 Underlying generalized intracranial artery dolichoectasia. 3. Small chronic infarcts in the right MCA and left PICA territories. Electronically Signed   By: Genevie Ann M.D.   On: 08/14/2017 11:16   Mr Brain Wo Contrast  Result Date: 08/13/2017 CLINICAL DATA:  Dizziness. EXAM: MRI HEAD WITHOUT CONTRAST MRA HEAD WITHOUT CONTRAST TECHNIQUE: Multiplanar, multiecho pulse sequences of the brain and surrounding structures were obtained without intravenous contrast. Angiographic images of the head were obtained using MRA technique without contrast. COMPARISON:  Head CT 08/13/2017 FINDINGS: MRI HEAD FINDINGS BRAIN: There is mild mass effect on the right ventral aspect of the brainstem by the ectatic/aneurysmal basilar artery. There is a diffusion abnormality adjacent to the medulla oblongata that is likely secondary to the adjacent basilar artery. There is no acute infarct, acute hemorrhage or mass. 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 Minimal periventricular white matter hyperintensity. Generalized atrophy without lobar predilection. Blood-sensitive sequences show no chronic microhemorrhage or superficial siderosis. SKULL AND UPPER CERVICAL SPINE: The visualized skull base, calvarium, upper cervical spine and extracranial soft tissues are normal. SINUSES/ORBITS: No fluid levels or advanced mucosal thickening. No mastoid or middle ear effusion. The orbits are normal. MRA HEAD FINDINGS The MRA portion of the exam is severely degraded by motion. 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 The distal basilar artery is tortuous, but poorly characterized on this study. There is inadequate visualization of the circle-of-Willis. IMPRESSION: 1. 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 The time-of-flight MRA is severely motion degraded. CT or catheter angiography would provide better characterization of the aneurysm, as clinically indicated. 2. No acute infarct. Diffusion abnormality along the right ventral aspect of the medulla oblongata is likely an artifact of the enlarged basilar artery. 3. 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 Electronically Signed   By: Ulyses Jarred M.D.   On: 08/13/2017 19:55   US Carotid Bilateral  Result Date: 08/14/2017 CLINICAL DATA:  Stroke-like symptoms EXAM: BILATERAL CAROTID DUPLEX ULTRASOUND TECHNIQUE: Pearline Cables scale imaging, color Doppler and duplex ultrasound were performed of bilateral carotid and vertebral arteries in the neck. COMPARISON:  None. FINDINGS: Criteria: Quantification of carotid stenosis is based on velocity parameters that correlate the residual internal carotid diameter with NASCET-based stenosis levels, using the diameter of the distal internal carotid lumen as the denominator for stenosis measurement. The following velocity measurements were obtained: RIGHT ICA:  78 cm/sec CCA:  81 cm/sec SYSTOLIC ICA/CCA RATIO:  1.0 DIASTOLIC ICA/CCA RATIO: ECA:  95 cm/sec LEFT ICA:  113 cm/sec CCA:  78 cm/sec SYSTOLIC ICA/CCA RATIO:  1.4 DIASTOLIC ICA/CCA RATIO: ECA:  143 cm/sec RIGHT CAROTID ARTERY: Moderate irregular calcified plaque in the bulb. Low resistance internal carotid Doppler pattern is preserved. RIGHT VERTEBRAL ARTERY:  Antegrade. LEFT CAROTID ARTERY: Moderate irregular calcified plaque in the bulb. Low resistance internal carotid Doppler pattern. LEFT VERTEBRAL ARTERY:  Antegrade. IMPRESSION: Less than 50% stenosis in the right and left internal carotid arteries. Electronically Signed   By: Marybelle Killings M.D.   On: 08/14/2017 15:16     Medications:   . sodium chloride 75 mL/hr at 08/15/17 1224   . aspirin  81  mg Oral Daily  . atorvastatin  40 mg Oral q1800  . citalopram  30 mg Oral Daily  . heparin  5,000 Units  Subcutaneous Q8H  . metoprolol tartrate  25 mg Oral BID  . pantoprazole  40 mg Oral QAC breakfast  . tamsulosin  0.4 mg Oral QPC breakfast  . umeclidinium-vilanterol  1 puff Inhalation Daily   acetaminophen **OR** acetaminophen, albuterol, albuterol, ALPRAZolam, bisacodyl, diphenhydrAMINE, hydrALAZINE, ondansetron **OR** ondansetron (ZOFRAN) IV, polyvinyl alcohol, senna-docusate, traMADol  Assessment/ Plan:  82 y.o. 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  1.  Chronic kidney disease stage V.  EGFR remains low at 14.  Creatinine currently 3.7.  This was similar to renal function testing performed in the office in June.  The patient may end up requiring renal placement therapy in the relative near future.  This was discussed with the patient as well as his daughter.  They would like to discuss this matter a bit more additionally.  2.  Anemia chronic kidney disease.  Hemoglobin currently 11.2.  No indication for Procrit at the moment.  3.  Hypertension.  Maintain the patient on metoprolol 25 mg by mouth twice a day.  4.  Secondary hyperparathyroidism.  Most recent PTH in the office was 243.  We will add calcitriol 0.25 g 3 times per week.   LOS: 1 [name] 8/1/20192:18 PM

## 2017-08-15 NOTE — Progress Notes (Signed)
PT Cancellation Note  Patient Details Name: [name] MRN: [id number] DOB: Apr 09, 1935   Cancelled Treatment:    Reason Eval/Treat Not Completed: Other (comment) PT hold EVAL per pt/family request to let pt rest. PT will reattempt in PM.  Rosario Adie, SPT    Rosario Adie 08/15/2017, 11:39 AM

## 2017-08-15 NOTE — Plan of Care (Signed)
  Problem: Clinical Measurements: Goal: Ability to maintain clinical measurements within normal limits will improve Outcome: Progressing   Problem: Nutrition: Goal: Adequate nutrition will be maintained Outcome: Progressing   Problem: Elimination: Goal: Will not experience complications related to bowel motility Outcome: Progressing   Problem: Skin Integrity: Goal: Risk for impaired skin integrity will decrease Outcome: Progressing   Problem: Nutrition: Goal: Risk of aspiration will decrease Outcome: Progressing   Problem: Ischemic Stroke/TIA Tissue Perfusion: Goal: Complications of ischemic stroke/TIA will be minimized Outcome: Progressing

## 2017-08-15 NOTE — Plan of Care (Signed)
  Problem: Education: Goal: Knowledge of General Education information will improve Description Including pain rating scale, medication(s)/side effects and non-pharmacologic comfort measures Outcome: Progressing   Problem: Health Behavior/Discharge Planning: Goal: Ability to manage health-related needs will improve Outcome: Progressing   Problem: Clinical Measurements: Goal: Ability to maintain clinical measurements within normal limits will improve Outcome: Progressing Goal: Will remain free from infection Outcome: Progressing Goal: Diagnostic test results will improve Outcome: Progressing Goal: Respiratory complications will improve Outcome: Progressing Goal: Cardiovascular complication will be avoided Outcome: Progressing   Problem: Activity: Goal: Risk for activity intolerance will decrease Outcome: Progressing   Problem: Nutrition: Goal: Adequate nutrition will be maintained Outcome: Progressing   Problem: Coping: Goal: Level of anxiety will decrease Outcome: Progressing   Problem: Elimination: Goal: Will not experience complications related to bowel motility Outcome: Progressing Goal: Will not experience complications related to urinary retention Outcome: Progressing   Problem: Pain Managment: Goal: General experience of comfort will improve Outcome: Progressing   Problem: Safety: Goal: Ability to remain free from injury will improve Outcome: Progressing   Problem: Skin Integrity: Goal: Risk for impaired skin integrity will decrease Outcome: Progressing   Problem: Education: Goal: Knowledge of disease or condition will improve Outcome: Progressing Goal: Knowledge of secondary prevention will improve Outcome: Progressing Goal: Knowledge of patient specific risk factors addressed and post discharge goals established will improve Outcome: Progressing Goal: Individualized Educational Video(s) Outcome: Progressing   Problem: Education: Goal: Knowledge  of disease or condition will improve Outcome: Progressing Goal: Knowledge of secondary prevention will improve Outcome: Progressing Goal: Knowledge of patient specific risk factors addressed and post discharge goals established will improve Outcome: Progressing Goal: Individualized Educational Video(s) Outcome: Progressing   Problem: Coping: Goal: Will verbalize positive feelings about self Outcome: Progressing Goal: Will identify appropriate support needs Outcome: Progressing   Problem: Health Behavior/Discharge Planning: Goal: Ability to manage health-related needs will improve Outcome: Progressing   Problem: Self-Care: Goal: Ability to participate in self-care as condition permits will improve Outcome: Progressing Goal: Verbalization of feelings and concerns over difficulty with self-care will improve Outcome: Progressing Goal: Ability to communicate needs accurately will improve Outcome: Progressing   Problem: Nutrition: Goal: Risk of aspiration will decrease Outcome: Progressing Goal: Dietary intake will improve Outcome: Progressing   Problem: Intracerebral Hemorrhage Tissue Perfusion: Goal: Complications of Intracerebral Hemorrhage will be minimized Outcome: Progressing   Problem: Ischemic Stroke/TIA Tissue Perfusion: Goal: Complications of ischemic stroke/TIA will be minimized Outcome: Progressing   Problem: Spontaneous Subarachnoid Hemorrhage Tissue Perfusion: Goal: Complications of Spontaneous Subarachnoid Hemorrhage will be minimized Outcome: Progressing

## 2017-08-15 NOTE — Progress Notes (Signed)
OT Cancellation Note  Patient Details Name: [name] MRN: [id number] DOB: April 21, 1935   Cancelled Treatment:    Reason Eval/Treat Not Completed: Fatigue/lethargy limiting ability to participate Family at bedside when OT presents for evaluation. Family stating pt has had restless night and just got to sleep. Family politely requests that therapy allow time for rest and return later. Will f/u as able.   Gerrianne Scale, MS, OTR/L ascom [phone] or [phone] 08/15/17, 11:33 AM

## 2017-08-15 NOTE — Progress Notes (Signed)
[place] [place] NAME: [name]    MR#:  [id number]  DATE OF BIRTH:  May 01, 1935  SUBJECTIVE:  CHIEF COMPLAINT:   Chief Complaint  Patient presents with  . Dizziness   -Restless legs last night.  Left-sided hemiplegia.  Right facial droop noted - daughter at bedside  REVIEW OF SYSTEMS:  Review of Systems  Constitutional: Positive for malaise/fatigue. Negative for chills and fever.  HENT: Negative for ear discharge, hearing loss and nosebleeds.   Eyes: Negative for blurred vision and double vision.  Respiratory: Negative for cough, shortness of breath and wheezing.   Cardiovascular: Negative for chest pain and palpitations.  Gastrointestinal: Negative for abdominal pain, constipation, diarrhea, nausea and vomiting.  Genitourinary: Negative for dysuria.  Neurological: Positive for dizziness, speech change and focal weakness. Negative for sensory change, seizures and headaches.  Psychiatric/Behavioral: Negative for depression.    DRUG ALLERGIES:   Allergies  Allergen Reactions  . Ace Inhibitors     Other reaction(s): Other (See Comments) Renal failure.  . Isosorbide Nitrate     Other reaction(s): Headache Stomach ache,nausea  . Nsaids     Other reaction(s): Unknown  . Prednisone Other (See Comments)    Feels like pins/needles/sharp stick on the inside    VITALS:  Blood pressure 118/75, pulse 64, temperature 98.2 F (36.8 C), temperature source Oral, resp. rate 18, height 6' (1.829 m), weight 86.2 kg (190 lb), SpO2 94 %.  PHYSICAL EXAMINATION:  Physical Exam   GENERAL:  82 y.o.-year-old patient lying in the bed with no acute distress.  EYES: Pupils equal, round, reactive to light and accommodation. No scleral icterus. Extraocular muscles intact. Right eye erythematous conjunctiva HEENT: Head atraumatic, normocephalic. Oropharynx and nasopharynx clear.  NECK:  Supple, no jugular venous distention. No thyroid  enlargement, no tenderness.  LUNGS: Normal breath sounds bilaterally, no wheezing, rales,rhonchi or crepitation. No use of accessory muscles of respiration. Decreased basilar breath sounds CARDIOVASCULAR: S1, S2 normal. No  rubs, or gallops. 2/6 systolic murmur present ABDOMEN: Soft, nontender, nondistended. Bowel sounds present. No organomegaly or mass.  EXTREMITIES: No pedal edema, cyanosis, or clubbing.  NEUROLOGIC: Cranial nerves II through XII are intact. right facial droop noted. Sensation intact. LUE is 1/5 and LLE is 2/5 weakness Gait not checked.  PSYCHIATRIC: The patient is alert and oriented x 2.  SKIN: No obvious rash, lesion, or ulcer.    LABORATORY PANEL:   CBC Recent Labs  Lab 08/15/17 0410  WBC 6.3  HGB 11.2*  HCT 32.7*  PLT 165   ------------------------------------------------------------------------------------------------------------------  Chemistries  Recent Labs  Lab 08/13/17 1344  08/15/17 0410  NA 139   < > 139  K 4.5   < > 5.1  CL 112*   < > 113*  CO2 21*   < > 22  GLUCOSE 123*   < > 97  BUN 48*   < > 44*  CREATININE 3.96*   < > 3.77*  CALCIUM 8.7*   < > 8.5*  MG 2.1  --   --   AST 15  --   --   ALT 12  --   --   ALKPHOS 112  --   --   BILITOT 0.5  --   --    < > = values in this interval not displayed.   ------------------------------------------------------------------------------------------------------------------  Cardiac Enzymes Recent Labs  Lab 08/13/17 1344  TROPONINI <0.03   ------------------------------------------------------------------------------------------------------------------  RADIOLOGY:  Ct Head Wo  Contrast  Result Date: 08/13/2017 CLINICAL DATA:  Acute presentation with severe dizziness. EXAM: CT HEAD WITHOUT CONTRAST TECHNIQUE: Contiguous axial images were obtained from the base of the skull through the vertex without intravenous contrast. COMPARISON:  None. FINDINGS: Brain: Generalized atrophy. No sign of  acute infarction, mass lesion, hemorrhage, hydrocephalus or extra-axial collection. 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 Vessel diameter up to 14 mm in diameter either at the distal vertebral or proximal basilar artery. Lesser ectasia of the supraclinoid internal carotid arteries. Skull: Normal Sinuses/Orbits: Clear sinuses. No fluid in the middle ears or mastoids. Orbits negative. Other: None IMPRESSION: No acute finding. The patient does have advanced atherosclerotic disease with dolichoectasia of the intracranial vessels, including dilatation of either the distal vertebral artery or the proximal basilar artery to a diameter of 14 mm. Given this, the possibility of acute posterior circulation ischemia should be considered given this presentation, though there is no discrete CT finding presently. Electronically Signed   By: Nelson Chimes M.D.   On: 08/13/2017 14:42   Mr Jodene Nam Head Wo Contrast  Result Date: 08/13/2017 CLINICAL DATA:  Dizziness. EXAM: MRI HEAD WITHOUT CONTRAST MRA HEAD WITHOUT CONTRAST TECHNIQUE: Multiplanar, multiecho pulse sequences of the brain and surrounding structures were obtained without intravenous contrast. Angiographic images of the head were obtained using MRA technique without contrast. COMPARISON:  Head CT 08/13/2017 FINDINGS: MRI HEAD FINDINGS BRAIN: There is mild mass effect on the right ventral aspect of the brainstem by the ectatic/aneurysmal basilar artery. There is a diffusion abnormality adjacent to the medulla oblongata that is likely secondary to the adjacent basilar artery. There is no acute infarct, acute hemorrhage or mass. 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 Minimal periventricular white matter hyperintensity. Generalized atrophy without lobar predilection. Blood-sensitive sequences show no chronic microhemorrhage or superficial siderosis. SKULL AND UPPER CERVICAL SPINE: The visualized skull base, calvarium,  upper cervical spine and extracranial soft tissues are normal. SINUSES/ORBITS: No fluid levels or advanced mucosal thickening. No mastoid or middle ear effusion. The orbits are normal. MRA HEAD FINDINGS The MRA portion of the exam is severely degraded by motion. 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 The distal basilar artery is tortuous, but poorly characterized on this study. There is inadequate visualization of the circle-of-Willis. IMPRESSION: 1. 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 The time-of-flight MRA is severely motion degraded. CT or catheter angiography would provide better characterization of the aneurysm, as clinically indicated. 2. No acute infarct. Diffusion abnormality along the right ventral aspect of the medulla oblongata is likely an artifact of the enlarged basilar artery. 3. 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 Electronically Signed   By: Ulyses Jarred M.D.   On: 08/13/2017 19:55   Mr Brain Wo Contrast  Result Date: 08/14/2017 CLINICAL DATA:  82 year old male with partially thrombosed basilar artery aneurysm, suspected artifactual abnormal diffusion signal in the right medulla on brain MRI yesterday. Increased left side weakness over night. EXAM: MRI HEAD WITHOUT CONTRAST TECHNIQUE: Multiplanar, multiecho pulse sequences of the brain and surrounding structures were obtained without intravenous contrast. COMPARISON:  Brain MRI and intracranial MRA 08/13/2017. FINDINGS: Brain: Progressed and now intense wedge-shaped restricted diffusion in the right medulla affecting the right pyramid on series 100, image 15. There is faint corresponding T2 and FLAIR hyperintensity. No associated hemorrhage or mass effect. No other restricted diffusion. Stable  mass effect on the right brainstem related to the abnormal vertebrobasilar arteries. No midline shift,  ventriculomegaly, extra-axial collection or acute intracranial hemorrhage. Pituitary within normal limits. No new signal abnormality elsewhere. Small cortical and subcortical white matter encephalomalacia in the right superior frontal gyrus. Tiny chronic lacunar infarcts in the left cerebellum. No chronic cerebral blood products. Vascular: Fusiform aneurysmal vertebrobasilar junction redemonstrated measuring up to 15 millimeters diameter appears stable since yesterday with partial thrombosis (series 7, image 7), a especially affecting the distal left vertebral artery. Mild to moderate generalized intracranial artery dolichoectasia otherwise. Major intracranial vascular flow voids are stable. Skull and upper cervical spine: Stable, negative. Sinuses/Orbits: Stable, negative. Other: Visible internal auditory structures appear normal. Mastoids remain clear. Scalp and face soft tissues appear negative. IMPRESSION: 1. Evolving Acute Right Medullary Infarct at the level of the right medullary pyramid. This was thought to be artifact on yesterday's MRI. No associated hemorrhage or mass effect. 2. 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 Underlying generalized intracranial artery dolichoectasia. 3. Small chronic infarcts in the right MCA and left PICA territories. Electronically Signed   By: Genevie Ann M.D.   On: 08/14/2017 11:16   Mr Brain Wo Contrast  Result Date: 08/13/2017 CLINICAL DATA:  Dizziness. EXAM: MRI HEAD WITHOUT CONTRAST MRA HEAD WITHOUT CONTRAST TECHNIQUE: Multiplanar, multiecho pulse sequences of the brain and surrounding structures were obtained without intravenous contrast. Angiographic images of the head were obtained using MRA technique without contrast. COMPARISON:  Head CT 08/13/2017 FINDINGS: MRI HEAD FINDINGS BRAIN: There is mild mass effect on the right ventral aspect of the brainstem by the ectatic/aneurysmal basilar artery. There  is a diffusion abnormality adjacent to the medulla oblongata that is likely secondary to the adjacent basilar artery. There is no acute infarct, acute hemorrhage or mass. 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 Minimal periventricular white matter hyperintensity. Generalized atrophy without lobar predilection. Blood-sensitive sequences show no chronic microhemorrhage or superficial siderosis. SKULL AND UPPER CERVICAL SPINE: The visualized skull base, calvarium, upper cervical spine and extracranial soft tissues are normal. SINUSES/ORBITS: No fluid levels or advanced mucosal thickening. No mastoid or middle ear effusion. The orbits are normal. MRA HEAD FINDINGS The MRA portion of the exam is severely degraded by motion. 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 The distal basilar artery is tortuous, but poorly characterized on this study. There is inadequate visualization of the circle-of-Willis. IMPRESSION: 1. 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 The time-of-flight MRA is severely motion degraded. CT or catheter angiography would provide better characterization of the aneurysm, as clinically indicated. 2. No acute infarct. Diffusion abnormality along the right ventral aspect of the medulla oblongata is likely an artifact of the enlarged basilar artery. 3. 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 Electronically Signed   By: Ulyses Jarred M.D.   On: 08/13/2017 19:55   US Carotid Bilateral  Result Date: 08/14/2017 CLINICAL DATA:  Stroke-like symptoms EXAM: BILATERAL CAROTID DUPLEX ULTRASOUND TECHNIQUE: Pearline Cables scale imaging, color Doppler and duplex ultrasound were performed of bilateral carotid and vertebral arteries in the neck. COMPARISON:  None. FINDINGS: Criteria: Quantification of carotid stenosis is based on velocity parameters that correlate the residual internal  carotid diameter with NASCET-based stenosis levels, using the diameter of the distal internal carotid lumen as the denominator for stenosis measurement. The following velocity measurements were obtained:  RIGHT ICA:  78 cm/sec CCA:  81 cm/sec SYSTOLIC ICA/CCA RATIO:  1.0 DIASTOLIC ICA/CCA RATIO: ECA:  95 cm/sec LEFT ICA:  113 cm/sec CCA:  78 cm/sec SYSTOLIC ICA/CCA RATIO:  1.4 DIASTOLIC ICA/CCA RATIO: ECA:  143 cm/sec RIGHT CAROTID ARTERY: Moderate irregular calcified plaque in the bulb. Low resistance internal carotid Doppler pattern is preserved. RIGHT VERTEBRAL ARTERY:  Antegrade. LEFT CAROTID ARTERY: Moderate irregular calcified plaque in the bulb. Low resistance internal carotid Doppler pattern. LEFT VERTEBRAL ARTERY:  Antegrade. IMPRESSION: Less than 50% stenosis in the right and left internal carotid arteries. Electronically Signed   By: Marybelle Killings M.D.   On: 08/14/2017 15:16   Dg Chest Port 1 View  Result Date: 08/13/2017 CLINICAL DATA:  Acute presentation with dizziness and chest pain. EXAM: PORTABLE CHEST 1 VIEW COMPARISON:  09/30/2013 FINDINGS: Poor inspiration. The upper lungs are clear. Abnormal density in both lower lobes could relate to the poor inspiration or there could be lower lobe atelectasis or pneumonia. No dense consolidation or lobar collapse. No effusions. No abnormal bone finding. Heart size appears normal. There is aortic atherosclerosis. IMPRESSION: Poor inspiration. Increased markings at the lung bases may be secondary to that. The possibility of lower lobe atelectasis or pneumonia does exist based on this exam. Electronically Signed   By: Nelson Chimes M.D.   On: 08/13/2017 14:38    EKG:  No orders found for this or any previous visit.  ASSESSMENT AND PLAN:   82 y/o Male with PMH of CKD stage 4, Hy[pertension, CAD, aortic stenosis, peripheral neuropathy presents from home connected dizziness.  1.  Acute stroke-patient presented with dizziness, overnight developed left  upper and lower leg weakness.   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  Interventional Radiology was contacted by neurology-did not recommend any procedures at this time. - carotid Dopplers with no hemodynamically significant stenosis.  Echocardiogram with no cardiac source of emboli, EF of 09% and diastolic dysfunction noted -Neurology consult is appreciated -Started aspirin and statin. -PT/OT and speech consults requested- might need rehab at discharge  2.  CKD stage IV-creatinine is stable.  Potassium slightly elevated.  Monitor urine output.  consulted nephrology  3.  Hypertension-history of hypertension.  On low-dose Toprol which we will continue at this time  4.  GERD-Protonix  5.  DVT prophylaxis-subcutaneous heparin   Updated daughter at bedside    All the records are reviewed and case discussed with Care Management/Social Workerr. Management plans discussed with the patient, family and they are in agreement.  CODE STATUS: Full Code  TOTAL  TIME SPENT IN TAKING CARE OF THIS PATIENT: 36 minutes.   POSSIBLE D/C IN 2 DAYS, DEPENDING ON CLINICAL CONDITION.   Gladstone Lighter M.D on 08/15/2017 at 1:29 PM  Between 7am to 6pm - Pager - [phone]  After 6pm go to [email or web address] - password [email or web address] [facility]  Office  [phone]  CC: Primary care physician; Rusty Aus, MD

## 2017-08-15 NOTE — Progress Notes (Signed)
During shift change with day nurse, patient's family communicated to nurse that they do not feel they understand what has happened with patient. Pnt;s wife states an MD came in this afternoon but they have additional questions and concerns related to MRI results and residuals pertaining to stroke.   Explained to family that an MD would need to come to speak to that and that I would ask who is on call to come to discuss in detail. Dr Jerelyn Charles came into room to discuss MRI. MD was able to answer all of the family's questions. Family's response was appropriate given the unfortunate circumstance of their loved one.

## 2017-08-15 NOTE — Evaluation (Signed)
Clinical/Bedside Swallow Evaluation Patient Details  Name: [name] MRN: [id number] Date of Birth: 03-27-1935  Today's Date: 08/15/2017 Time: SLP Start Time (ACUTE ONLY): 0910 SLP Stop Time (ACUTE ONLY): 1000 SLP Time Calculation (min) (ACUTE ONLY): 50 min  Past Medical History:  Past Medical History:  Diagnosis Date  . Aortic stenosis   . CAD (coronary artery disease)   . CKD (chronic kidney disease) stage 4, GFR 15-29 ml/min (HCC)   . Hereditary and idiopathic peripheral neuropathy   . HTN (hypertension)   . Pulmonary nodules    Past Surgical History:  Past Surgical History:  Procedure Laterality Date  . CARDIAC CATHETERIZATION N/A 08/04/2015   Procedure: Right/Left Heart Cath and Coronary Angiography;  Surgeon: Yolonda Kida, MD;  Location: [facility];  Service: Cardiovascular;  Laterality: N/A;   HPI:  [name] is an 82 y.o. male with a known history of CAD, aortic stenosis, CKD stage IV, hypertension, pulmonary nodule and hereditary and angiopathic peripheral neuropathy who presented on on 08/13/17 with complaints of acute onset of dizziness.  The patient reports that he awakened on yesterday with severe dizziness.  Had nausea and vomiting.  Felt off balance as well.  Initially without any weakness but reports that not long after admission developed weakness on the left.     Assessment / Plan / Recommendation Clinical Impression  Pt presents with no apparent oropharyngeal dysphagia. Pt was given trials of ice chips, thin, puree and solid consistencies. No overt s/s of aspiration were observed with any tested consistencies. Vocal quality remained clear throughout trials and laryngeal elevation appeared adeqaute. Pt demonstrated adequate mastication of solid and good oral clearing of all consistencies. No pocketing or holding observed. Oral mech exam revealed very mild left labial asymmetry, however lip seal with oral intake was good. No anterior spillage observed.  Facial and lingual sensation, strength, and ROM were intact. Pt was lethargic during evaluation, although cooperative and given recent diagnosis of CVA would benefit from Dysphagia 3 diet with thin liquids for ease of mastication. Pt is judged to be a mild aspiration risk which is improved with aspiration precautions and Dysphagia 3 diet. Will f/u re: toleration of diet in 1-2 days.  SLP Visit Diagnosis: Dysphagia, unspecified (R13.10)    Aspiration Risk  Mild aspiration risk    Diet Recommendation Dysphagia 3 (Mech soft);Thin liquid   Liquid Administration via: Straw;Cup Medication Administration: Whole meds with liquid Supervision: Patient able to self feed;Comment(Set up needed) Compensations: Minimize environmental distractions;Slow rate;Small sips/bites Postural Changes: Seated upright at 90 degrees;Remain upright for at least 30 minutes after po intake    Other  Recommendations Oral Care Recommendations: Oral care BID;Patient independent with oral care   Follow up Recommendations None      Frequency and Duration min 2x/week  2 weeks       Prognosis Prognosis for Safe Diet Advancement: Good      Swallow Study   General Date of Onset: 08/13/17 HPI: [name] is an 82 y.o. male with a known history of CAD, aortic stenosis, CKD stage IV, hypertension, pulmonary nodule and hereditary and angiopathic peripheral neuropathy who presented on on 08/13/17 with complaints of acute onset of dizziness.  The patient reports that he awakened on yesterday with severe dizziness.  Had nausea and vomiting.  Felt off balance as well.  Initially without any weakness but reports that not long after admission developed weakness on the left.   Type of Study: Bedside Swallow Evaluation  Previous Swallow Assessment: none Diet Prior to this Study: Regular;Thin liquids Temperature Spikes Noted: No Respiratory Status: Room air History of Recent Intubation: No Behavior/Cognition:  Cooperative;Lethargic/Drowsy Oral Cavity Assessment: Dry Oral Care Completed by SLP: No Oral Cavity - Dentition: Dentures, top;Dentures, bottom Vision: Functional for self-feeding Self-Feeding Abilities: Needs set up Patient Positioning: Upright in bed Baseline Vocal Quality: Normal Volitional Cough: Strong Volitional Swallow: Able to elicit    Oral/Motor/Sensory Function Overall Oral Motor/Sensory Function: Mild impairment Facial ROM: Within Functional Limits Facial Symmetry: Within Functional Limits Facial Strength: Within Functional Limits Facial Sensation: Within Functional Limits Lingual ROM: Within Functional Limits Lingual Symmetry: Abnormal symmetry left Lingual Strength: Within Functional Limits Lingual Sensation: Within Functional Limits Velum: Within Functional Limits Mandible: Within Functional Limits   Ice Chips Ice chips: Within functional limits Presentation: Spoon   Thin Liquid Thin Liquid: Within functional limits Presentation: Cup;Spoon;Self Fed    Nectar Thick Nectar Thick Liquid: Not tested   Honey Thick Honey Thick Liquid: Not tested   Puree Puree: Within functional limits Presentation: Spoon   Solid     Solid: Within functional limits Presentation: [place], [place], [place] Pathologist  [place] 08/15/2017,10:02 AM

## 2017-08-15 NOTE — Progress Notes (Signed)
Subjective: Patient is more alert today but continues to have left sided weakness.  No new neurological complaints.    Objective: Current vital signs: BP (!) 143/92   Pulse 66   Temp 98 F (36.7 C) (Oral)   Resp 17   Ht 6' (1.829 m)   Wt 86.2 kg (190 lb)   SpO2 97%   BMI 25.77 kg/m  Vital signs in last 24 hours: Temp:  [97.8 F (36.6 C)-98.9 F (37.2 C)] 98 F (36.7 C) (08/01 0803) Pulse Rate:  [64-71] 66 (08/01 0803) Resp:  [17-20] 17 (08/01 0339) BP: (123-144)/(63-92) 143/92 (08/01 0803) SpO2:  [95 %-98 %] 97 % (08/01 0803)  Intake/Output from previous day: 07/31 0701 - 08/01 0700 In: 1050 [P.O.:600; I.V.:450] Out: 251 [Urine:251] Intake/Output this shift: No intake/output data recorded. Nutritional status:  Diet Order           DIET DYS 3 Room service appropriate? Yes; Fluid consistency: Thin  Diet effective now          Neurologic Exam: Mental Status: Alert, oriented, thought content appropriate.  Speech dysarthric.  Able to follow 3 step commands without difficulty. Cranial Nerves: II: Discs flat bilaterally; Visual fields grossly normal, pupils equal, round, reactive to light and accommodation III,IV, VI: ptosis not present, extra-ocular motions intact bilaterally V,VII: decrease in left NLF, facial light touch sensation normal bilaterally VIII: hearing normal bilaterally IX,X: gag reflex present XI: bilateral shoulder shrug XII: midline tongue extension Motor: Right :  Upper extremity   5/5                                      Left:     Upper extremity   1-2/5             Lower extremity   5/5                                                  Lower extremity   3/5 Tone and bulk:normal tone throughout; no atrophy noted Sensory: Pinprick and light touch decreased in the LLE   Lab Results: Basic Metabolic Panel: Recent Labs  Lab 08/13/17 1344 08/14/17 0330 08/15/17 0410  NA 139 139 139  K 4.5 5.1 5.1  CL 112* 113* 113*  CO2 21* 22 22  GLUCOSE  123* 103* 97  BUN 48* 47* 44*  CREATININE 3.96* 3.72* 3.77*  CALCIUM 8.7* 8.5* 8.5*  MG 2.1  --   --     Liver Function Tests: Recent Labs  Lab 08/13/17 1344  AST 15  ALT 12  ALKPHOS 112  BILITOT 0.5  PROT 7.3  ALBUMIN 4.0   No results for input(s): LIPASE, AMYLASE in the last 168 hours. No results for input(s): AMMONIA in the last 168 hours.  CBC: Recent Labs  Lab 08/13/17 1344 08/15/17 0410  WBC 6.9 6.3  NEUTROABS 4.9  --   HGB 12.5* 11.2*  HCT 36.9* 32.7*  MCV 84.9 84.4  PLT 184 165    Cardiac Enzymes: Recent Labs  Lab 08/13/17 1344  TROPONINI <0.03    Lipid Panel: No results for input(s): CHOL, TRIG, HDL, CHOLHDL, VLDL, LDLCALC in the last 168 hours.  CBG: No results for input(s): GLUCAP in the last 168 hours.  Microbiology:  No results found for this or any previous visit.  Coagulation Studies: No results for input(s): LABPROT, INR in the last 72 hours.  Imaging: Ct Head Wo Contrast  Result Date: 08/13/2017 CLINICAL DATA:  Acute presentation with severe dizziness. EXAM: CT HEAD WITHOUT CONTRAST TECHNIQUE: Contiguous axial images were obtained from the base of the skull through the vertex without intravenous contrast. COMPARISON:  None. FINDINGS: Brain: Generalized atrophy. No sign of acute infarction, mass lesion, hemorrhage, hydrocephalus or extra-axial collection. 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 Vessel diameter up to 14 mm in diameter either at the distal vertebral or proximal basilar artery. Lesser ectasia of the supraclinoid internal carotid arteries. Skull: Normal Sinuses/Orbits: Clear sinuses. No fluid in the middle ears or mastoids. Orbits negative. Other: None IMPRESSION: No acute finding. The patient does have advanced atherosclerotic disease with dolichoectasia of the intracranial vessels, including dilatation of either the distal vertebral artery or the proximal basilar artery to a  diameter of 14 mm. Given this, the possibility of acute posterior circulation ischemia should be considered given this presentation, though there is no discrete CT finding presently. Electronically Signed   By: Nelson Chimes M.D.   On: 08/13/2017 14:42   Mr Jodene Nam Head Wo Contrast  Result Date: 08/13/2017 CLINICAL DATA:  Dizziness. EXAM: MRI HEAD WITHOUT CONTRAST MRA HEAD WITHOUT CONTRAST TECHNIQUE: Multiplanar, multiecho pulse sequences of the brain and surrounding structures were obtained without intravenous contrast. Angiographic images of the head were obtained using MRA technique without contrast. COMPARISON:  Head CT 08/13/2017 FINDINGS: MRI HEAD FINDINGS BRAIN: There is mild mass effect on the right ventral aspect of the brainstem by the ectatic/aneurysmal basilar artery. There is a diffusion abnormality adjacent to the medulla oblongata that is likely secondary to the adjacent basilar artery. There is no acute infarct, acute hemorrhage or mass. 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 Minimal periventricular white matter hyperintensity. Generalized atrophy without lobar predilection. Blood-sensitive sequences show no chronic microhemorrhage or superficial siderosis. SKULL AND UPPER CERVICAL SPINE: The visualized skull base, calvarium, upper cervical spine and extracranial soft tissues are normal. SINUSES/ORBITS: No fluid levels or advanced mucosal thickening. No mastoid or middle ear effusion. The orbits are normal. MRA HEAD FINDINGS The MRA portion of the exam is severely degraded by motion. 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 The distal basilar artery is tortuous, but poorly characterized on this study. There is inadequate visualization of the circle-of-Willis. IMPRESSION: 1. 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 The time-of-flight MRA is severely motion  degraded. CT or catheter angiography would provide better characterization of the aneurysm, as clinically indicated. 2. No acute infarct. Diffusion abnormality along the right ventral aspect of the medulla oblongata is likely an artifact of the enlarged basilar artery. 3. 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 Electronically Signed   By: Ulyses Jarred M.D.   On: 08/13/2017 19:55   Mr Brain Wo Contrast  Result Date: 08/14/2017 CLINICAL DATA:  82 year old male with partially thrombosed basilar artery aneurysm, suspected artifactual abnormal diffusion signal in the right medulla on brain MRI yesterday. Increased left side weakness over night. EXAM: MRI HEAD WITHOUT CONTRAST TECHNIQUE: Multiplanar, multiecho pulse sequences of the brain and surrounding structures were obtained without intravenous contrast. COMPARISON:  Brain MRI and intracranial MRA 08/13/2017. FINDINGS: Brain: Progressed and now intense wedge-shaped restricted diffusion in the right medulla  affecting the right pyramid on series 100, image 15. There is faint corresponding T2 and FLAIR hyperintensity. No associated hemorrhage or mass effect. No other restricted diffusion. Stable mass effect on the right brainstem related to the abnormal vertebrobasilar arteries. No midline shift, ventriculomegaly, extra-axial collection or acute intracranial hemorrhage. Pituitary within normal limits. No new signal abnormality elsewhere. Small cortical and subcortical white matter encephalomalacia in the right superior frontal gyrus. Tiny chronic lacunar infarcts in the left cerebellum. No chronic cerebral blood products. Vascular: Fusiform aneurysmal vertebrobasilar junction redemonstrated measuring up to 15 millimeters diameter appears stable since yesterday with partial thrombosis (series 7, image 7), a especially affecting the distal left vertebral artery. Mild to moderate generalized intracranial artery dolichoectasia otherwise. Major intracranial vascular  flow voids are stable. Skull and upper cervical spine: Stable, negative. Sinuses/Orbits: Stable, negative. Other: Visible internal auditory structures appear normal. Mastoids remain clear. Scalp and face soft tissues appear negative. IMPRESSION: 1. Evolving Acute Right Medullary Infarct at the level of the right medullary pyramid. This was thought to be artifact on yesterday's MRI. No associated hemorrhage or mass effect. 2. 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 Underlying generalized intracranial artery dolichoectasia. 3. Small chronic infarcts in the right MCA and left PICA territories. Electronically Signed   By: Genevie Ann M.D.   On: 08/14/2017 11:16   Mr Brain Wo Contrast  Result Date: 08/13/2017 CLINICAL DATA:  Dizziness. EXAM: MRI HEAD WITHOUT CONTRAST MRA HEAD WITHOUT CONTRAST TECHNIQUE: Multiplanar, multiecho pulse sequences of the brain and surrounding structures were obtained without intravenous contrast. Angiographic images of the head were obtained using MRA technique without contrast. COMPARISON:  Head CT 08/13/2017 FINDINGS: MRI HEAD FINDINGS BRAIN: There is mild mass effect on the right ventral aspect of the brainstem by the ectatic/aneurysmal basilar artery. There is a diffusion abnormality adjacent to the medulla oblongata that is likely secondary to the adjacent basilar artery. There is no acute infarct, acute hemorrhage or mass. 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 Minimal periventricular white matter hyperintensity. Generalized atrophy without lobar predilection. Blood-sensitive sequences show no chronic microhemorrhage or superficial siderosis. SKULL AND UPPER CERVICAL SPINE: The visualized skull base, calvarium, upper cervical spine and extracranial soft tissues are normal. SINUSES/ORBITS: No fluid levels or advanced mucosal thickening. No mastoid or middle ear effusion. The orbits are normal. MRA HEAD FINDINGS The  MRA portion of the exam is severely degraded by motion. 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 The distal basilar artery is tortuous, but poorly characterized on this study. There is inadequate visualization of the circle-of-Willis. IMPRESSION: 1. 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 The time-of-flight MRA is severely motion degraded. CT or catheter angiography would provide better characterization of the aneurysm, as clinically indicated. 2. No acute infarct. Diffusion abnormality along the right ventral aspect of the medulla oblongata is likely an artifact of the enlarged basilar artery. 3. 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 Electronically Signed   By: Ulyses Jarred M.D.   On: 08/13/2017 19:55   US Carotid Bilateral  Result Date: 08/14/2017 CLINICAL DATA:  Stroke-like symptoms EXAM: BILATERAL CAROTID DUPLEX ULTRASOUND TECHNIQUE: Pearline Cables scale imaging, color Doppler and duplex ultrasound were performed of bilateral carotid and vertebral arteries in the neck. COMPARISON:  None. FINDINGS: Criteria: Quantification of carotid stenosis is based on velocity parameters that correlate the residual internal  carotid diameter with NASCET-based stenosis levels, using the diameter of the distal internal carotid lumen as the denominator for stenosis measurement. The following velocity measurements were obtained: RIGHT ICA:  78 cm/sec CCA:  81 cm/sec SYSTOLIC ICA/CCA RATIO:  1.0 DIASTOLIC ICA/CCA RATIO: ECA:  95 cm/sec LEFT ICA:  113 cm/sec CCA:  78 cm/sec SYSTOLIC ICA/CCA RATIO:  1.4 DIASTOLIC ICA/CCA RATIO: ECA:  143 cm/sec RIGHT CAROTID ARTERY: Moderate irregular calcified plaque in the bulb. Low resistance internal carotid Doppler pattern is preserved. RIGHT VERTEBRAL ARTERY:  Antegrade. LEFT CAROTID ARTERY: Moderate irregular calcified plaque in the bulb. Low resistance  internal carotid Doppler pattern. LEFT VERTEBRAL ARTERY:  Antegrade. IMPRESSION: Less than 50% stenosis in the right and left internal carotid arteries. Electronically Signed   By: Marybelle Killings M.D.   On: 08/14/2017 15:16   Dg Chest Port 1 View  Result Date: 08/13/2017 CLINICAL DATA:  Acute presentation with dizziness and chest pain. EXAM: PORTABLE CHEST 1 VIEW COMPARISON:  09/30/2013 FINDINGS: Poor inspiration. The upper lungs are clear. Abnormal density in both lower lobes could relate to the poor inspiration or there could be lower lobe atelectasis or pneumonia. No dense consolidation or lobar collapse. No effusions. No abnormal bone finding. Heart size appears normal. There is aortic atherosclerosis. IMPRESSION: Poor inspiration. Increased markings at the lung bases may be secondary to that. The possibility of lower lobe atelectasis or pneumonia does exist based on this exam. Electronically Signed   By: Nelson Chimes M.D.   On: 08/13/2017 14:38    Medications:  I have reviewed the patient's current medications. Scheduled: . aspirin  81 mg Oral Daily  . atorvastatin  40 mg Oral q1800  . citalopram  30 mg Oral Daily  . heparin  5,000 Units Subcutaneous Q8H  . metoprolol tartrate  25 mg Oral BID  . pantoprazole  40 mg Oral QAC breakfast  . tamsulosin  0.4 mg Oral QPC breakfast  . umeclidinium-vilanterol  1 puff Inhalation Daily    Assessment/Plan: No new neurological complaints.  Patient on ASA and statin.  A1c and lipid panel pending. Carotid dopplers show no evidence of hemodynamically significant stenosis.  Echocardiogram shows no cardiac source of emboli with an EF of 60-65%.    Recommendations: 1.  Continue therapy      LOS: 1 day   Alexis Goodell, MD Neurology [phone] 08/15/2017  11:48 AM

## 2017-08-16 LAB — BASIC METABOLIC PANEL
ANION GAP: 5 (ref 5–15)
BUN: 40 mg/dL — ABNORMAL HIGH (ref 8–23)
CALCIUM: 8.3 mg/dL — AB (ref 8.9–10.3)
CO2: 21 mmol/L — ABNORMAL LOW (ref 22–32)
Chloride: 115 mmol/L — ABNORMAL HIGH (ref 98–111)
Creatinine, Ser: 3.29 mg/dL — ABNORMAL HIGH (ref 0.61–1.24)
GFR, EST AFRICAN AMERICAN: 19 mL/min — AB (ref >60)
GFR, EST NON AFRICAN AMERICAN: 16 mL/min — AB (ref >60)
GLUCOSE: 98 mg/dL (ref 70–99)
Potassium: 4.8 mmol/L (ref 3.5–5.1)
Sodium: 141 mmol/L (ref 135–145)

## 2017-08-16 LAB — HEMOGLOBIN A1C
Hgb A1c MFr Bld: 5.4 % (ref 4.8–5.6)
MEAN PLASMA GLUCOSE: 108.28 mg/dL

## 2017-08-16 MED ORDER — AMOXICILLIN-POT CLAVULANATE 875-125 MG PO TABS
1.0000 | ORAL_TABLET | Freq: Two times a day (BID) | ORAL | Status: DC
  Administered 2017-08-17: 1 via ORAL
  Filled 2017-08-16: qty 1

## 2017-08-16 NOTE — Progress Notes (Signed)
Patient complaining of LLE and LUE having a contracting sensation, notified Dr. Prudence Davidson on call, Dr. Prudence Davidson stated the type of CVA the patient had effects the muscle sensation of the left arm and leg. Explained to patient and family.

## 2017-08-16 NOTE — Progress Notes (Signed)
Inpatient Rehabilitation  Per PT request, patient was screened by Gunnar Fusi for appropriateness for an Inpatient Acute Rehab consult.  At this time we are recommending an Inpatient Rehab consult and plan to follow for OT evaluation and therapy progression with hopeful ambulation at next session.  Call if questions.    Carmelia Roller., CCC/SLP Admission Coordinator  [place]  Cell [phone]

## 2017-08-16 NOTE — Plan of Care (Signed)
  Problem: Education: Goal: Knowledge of General Education information will improve Description Including pain rating scale, medication(s)/side effects and non-pharmacologic comfort measures Outcome: Progressing   Problem: Clinical Measurements: Goal: Ability to maintain clinical measurements within normal limits will improve Outcome: Progressing Goal: Will remain free from infection Outcome: Progressing Goal: Diagnostic test results will improve Outcome: Progressing Goal: Respiratory complications will improve Outcome: Progressing Goal: Cardiovascular complication will be avoided Outcome: Progressing   Problem: Nutrition: Goal: Adequate nutrition will be maintained Outcome: Progressing   Problem: Coping: Goal: Level of anxiety will decrease Outcome: Progressing   Problem: Elimination: Goal: Will not experience complications related to bowel motility Outcome: Progressing Goal: Will not experience complications related to urinary retention Outcome: Progressing   Problem: Pain Managment: Goal: General experience of comfort will improve Outcome: Progressing   Problem: Safety: Goal: Ability to remain free from injury will improve Outcome: Progressing   Problem: Health Behavior/Discharge Planning: Goal: Ability to manage health-related needs will improve Outcome: Progressing   Problem: Self-Care: Goal: Ability to participate in self-care as condition permits will improve Outcome: Progressing Goal: Verbalization of feelings and concerns over difficulty with self-care will improve Outcome: Progressing Goal: Ability to communicate needs accurately will improve Outcome: Progressing   Problem: Nutrition: Goal: Risk of aspiration will decrease Outcome: Progressing Goal: Dietary intake will improve Outcome: Progressing   Problem: Ischemic Stroke/TIA Tissue Perfusion: Goal: Complications of ischemic stroke/TIA will be minimized Outcome: Progressing

## 2017-08-16 NOTE — Progress Notes (Signed)
  Speech Language Pathology Treatment: Dysphagia  Patient Details Name: [name] MRN: [id number] DOB: October 02, 1935 Today's Date: 08/16/2017 Time: 4680-3212 SLP Time Calculation (min) (ACUTE ONLY): 40 min  Assessment / Plan / Recommendation Clinical Impression  Pt seen today to assess toleration of diet and to evaluated if he would be appropriate for a diet upgrade. Lunch tray was still in patients room, untouched. ST set up tray in front of patient and he was able to feed himself with his right hand for the entirety of the meal. Pt consumed 4 ounces of thin liquid via cup and ~8 ounces of chopped meats and puree. No overt s/s of aspiration were observed during the meal. Pt did eat quickly with large bites and encouraged pt to slow down and take smaller bites. Vocal quality remained clear and respiratory status appeared stable between boluses. Oral clearing was adequate. Pt did not appear to fatigue during meal, however after he finished eating noted slight SOB. Educated pt on eating slower to avoid any SOB. Pt continues to be mild aspiration risk, but appears appropriate for upgrade to regular diet with thin liquids with aspiration precautions. Will f/u in 1-2 days re: toleration of diet. *Noted that pt has mildly slurred speech during this interaction which was not observed yesterday morning during evaluation. Pt's family in the room and stated that they thought his speech has been intermittently slurred. Change in speech may be d/t increased fatigue over the day. Informed nsg and will continue to monitor and assess speech as necessary in 1-2 days.   HPI HPI: [name] is an 82 y.o. male with a known history of CAD, aortic stenosis, CKD stage IV, hypertension, pulmonary nodule and hereditary and angiopathic peripheral neuropathy who presented on on 08/13/17 with complaints of acute onset of dizziness.  The patient reports that he awakened on yesterday with severe dizziness.  Had nausea and vomiting.   Felt off balance as well.  Initially without any weakness but reports that not long after admission developed weakness on the left.  Pt evaluated yesterday and placed on Dysaphagia III diet w/thin liquids. No reportst of difficulty tolerating current diet.       SLP Plan  Continue with current plan of care       Recommendations  Diet recommendations: Regular;Thin liquid Liquids provided via: Cup;Straw Medication Administration: Whole meds with liquid Supervision: Patient able to self feed Compensations: Minimize environmental distractions;Slow rate;Small sips/bites Postural Changes and/or Swallow Maneuvers: Seated upright 90 degrees                Oral Care Recommendations: Oral care BID;Patient independent with oral care Follow up Recommendations: Inpatient Rehab SLP Visit Diagnosis: Dysphagia, unspecified (R13.10) Plan: Continue with current plan of care       [place], MA, CCC-SLP  Speech-Language Pathologist       [place] 08/16/2017, 2:47 PM

## 2017-08-16 NOTE — Care Management Important Message (Signed)
Important Message  Patient Details  Name: [name] MRN: [id number] Date of Birth: 10/25/1935   Medicare Important Message Given:  Yes    [name] A Montrice Montuori 08/16/2017, 11:36 AM

## 2017-08-16 NOTE — Progress Notes (Signed)
Received a call from RN that patient has a draining perianal abscess. Patient has been afebrile and hemodynamically stable. Will start Augmentin q12 hours per up-to-date recommendations.  Hyman Bible, MD

## 2017-08-16 NOTE — Progress Notes (Addendum)
Pt has a draining abscess to left buttock, perianal.  Pt reports he has had it for ten years, it drains a little and he has never told a doctor or had it looked out.  Skin is raised,red, with purulent, malodorous drainage. Washed with skin wipe, applied alginate then foam.  Will notify doctor for new orders. Dorna Bloom RN

## 2017-08-16 NOTE — Progress Notes (Signed)
OT Cancellation Note  Patient Details Name: [name] MRN: [id number] DOB: 10/03/1935   Cancelled Treatment:    Reason Eval/Treat Not Completed: Other (comment). Pt with nursing for assessment. Will re-attempt at later time this morning for OT evaluation.   Jeni Salles, MPH, MS, OTR/L ascom [phone] 08/16/17, 8:43 AM

## 2017-08-16 NOTE — Progress Notes (Signed)
Inpatient Rehabilitation  Referral received and discussed with nurse case manager Hassan Rowan.  Insurance authorization initiated with [facility].  Plan to follow for timing of medical readiness and insurance authorization.  Call if questions.   Carmelia Roller., CCC/SLP Admission Coordinator  [place]  Cell [phone]

## 2017-08-16 NOTE — Progress Notes (Signed)
[facility]  ROUNDING NOTE   Subjective:  Patient continues to have left-sided weakness. Renal function has slightly improved as BUN is down to 40 with a creatinine of 3.29.   Objective:  Vital signs in last 24 hours:  Temp:  [98 F (36.7 C)-99 F (37.2 C)] 98 F (36.7 C) (08/02 0833) Pulse Rate:  [64-71] 64 (08/02 0939) Resp:  [16-21] 16 (08/02 0833) BP: (118-157)/(68-93) 118/68 (08/02 0939) SpO2:  [94 %-99 %] 98 % (08/02 0833)  Weight change:  Filed Weights   08/13/17 1345  Weight: 86.2 kg (190 lb)    Intake/Output: I/O last 3 completed shifts: In: 740 [P.O.:290; I.V.:450] Out: 200 [Urine:200]   Intake/Output this shift:  Total I/O In: 240 [P.O.:240] Out: -   Physical Exam: General: No acute distress  Head: Normocephalic, atraumatic. Moist oral mucosal membranes  Eyes: Anicteric  Neck: Supple, trachea midline  Lungs:  Clear to auscultation, normal effort  Heart: S1S2 no rubs  Abdomen:  Soft, nontender, bowel sounds present  Extremities: Trace peripheral edema.  Neurologic: Left sided weakness noted, awake, alert  Skin: No lesions       Basic Metabolic Panel: Recent Labs  Lab 08/13/17 1344 08/14/17 0330 08/15/17 0410 08/16/17 0511  NA 139 139 139 141  K 4.5 5.1 5.1 4.8  CL 112* 113* 113* 115*  CO2 21* 22 22 21*  GLUCOSE 123* 103* 97 98  BUN 48* 47* 44* 40*  CREATININE 3.96* 3.72* 3.77* 3.29*  CALCIUM 8.7* 8.5* 8.5* 8.3*  MG 2.1  --   --   --     Liver Function Tests: Recent Labs  Lab 08/13/17 1344  AST 15  ALT 12  ALKPHOS 112  BILITOT 0.5  PROT 7.3  ALBUMIN 4.0   No results for input(s): LIPASE, AMYLASE in the last 168 hours. No results for input(s): AMMONIA in the last 168 hours.  CBC: Recent Labs  Lab 08/13/17 1344 08/15/17 0410  WBC 6.9 6.3  NEUTROABS 4.9  --   HGB 12.5* 11.2*  HCT 36.9* 32.7*  MCV 84.9 84.4  PLT 184 165    Cardiac Enzymes: Recent Labs  Lab 08/13/17 1344  TROPONINI <0.03     BNP: Invalid input(s): POCBNP  CBG: No results for input(s): GLUCAP in the last 168 hours.  Microbiology: No results found for this or any previous visit.  Coagulation Studies: No results for input(s): LABPROT, INR in the last 72 hours.  Urinalysis: Recent Labs    08/13/17 1442  COLORURINE YELLOW*  LABSPEC 1.012  PHURINE 5.0  GLUCOSEU NEGATIVE  HGBUR NEGATIVE  BILIRUBINUR NEGATIVE  KETONESUR NEGATIVE  PROTEINUR 100*  NITRITE NEGATIVE  LEUKOCYTESUR NEGATIVE      Imaging: Mr Brain Wo Contrast  Result Date: 08/14/2017 CLINICAL DATA:  82 year old male with partially thrombosed basilar artery aneurysm, suspected artifactual abnormal diffusion signal in the right medulla on brain MRI yesterday. Increased left side weakness over night. EXAM: MRI HEAD WITHOUT CONTRAST TECHNIQUE: Multiplanar, multiecho pulse sequences of the brain and surrounding structures were obtained without intravenous contrast. COMPARISON:  Brain MRI and intracranial MRA 08/13/2017. FINDINGS: Brain: Progressed and now intense wedge-shaped restricted diffusion in the right medulla affecting the right pyramid on series 100, image 15. There is faint corresponding T2 and FLAIR hyperintensity. No associated hemorrhage or mass effect. No other restricted diffusion. Stable mass effect on the right brainstem related to the abnormal vertebrobasilar arteries. No midline shift, ventriculomegaly, extra-axial collection or acute intracranial hemorrhage. Pituitary within normal limits.  No new signal abnormality elsewhere. Small cortical and subcortical white matter encephalomalacia in the right superior frontal gyrus. Tiny chronic lacunar infarcts in the left cerebellum. No chronic cerebral blood products. Vascular: Fusiform aneurysmal vertebrobasilar junction redemonstrated measuring up to 15 millimeters diameter appears stable since yesterday with partial thrombosis (series 7, image 7), a especially affecting the distal left  vertebral artery. Mild to moderate generalized intracranial artery dolichoectasia otherwise. Major intracranial vascular flow voids are stable. Skull and upper cervical spine: Stable, negative. Sinuses/Orbits: Stable, negative. Other: Visible internal auditory structures appear normal. Mastoids remain clear. Scalp and face soft tissues appear negative. IMPRESSION: 1. Evolving Acute Right Medullary Infarct at the level of the right medullary pyramid. This was thought to be artifact on yesterday's MRI. No associated hemorrhage or mass effect. 2. 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 Underlying generalized intracranial artery dolichoectasia. 3. Small chronic infarcts in the right MCA and left PICA territories. Electronically Signed   By: Genevie Ann M.D.   On: 08/14/2017 11:16   US Carotid Bilateral  Result Date: 08/14/2017 CLINICAL DATA:  Stroke-like symptoms EXAM: BILATERAL CAROTID DUPLEX ULTRASOUND TECHNIQUE: Pearline Cables scale imaging, color Doppler and duplex ultrasound were performed of bilateral carotid and vertebral arteries in the neck. COMPARISON:  None. FINDINGS: Criteria: Quantification of carotid stenosis is based on velocity parameters that correlate the residual internal carotid diameter with NASCET-based stenosis levels, using the diameter of the distal internal carotid lumen as the denominator for stenosis measurement. The following velocity measurements were obtained: RIGHT ICA:  78 cm/sec CCA:  81 cm/sec SYSTOLIC ICA/CCA RATIO:  1.0 DIASTOLIC ICA/CCA RATIO: ECA:  95 cm/sec LEFT ICA:  113 cm/sec CCA:  78 cm/sec SYSTOLIC ICA/CCA RATIO:  1.4 DIASTOLIC ICA/CCA RATIO: ECA:  143 cm/sec RIGHT CAROTID ARTERY: Moderate irregular calcified plaque in the bulb. Low resistance internal carotid Doppler pattern is preserved. RIGHT VERTEBRAL ARTERY:  Antegrade. LEFT CAROTID ARTERY: Moderate irregular calcified plaque in the bulb. Low resistance internal  carotid Doppler pattern. LEFT VERTEBRAL ARTERY:  Antegrade. IMPRESSION: Less than 50% stenosis in the right and left internal carotid arteries. Electronically Signed   By: Marybelle Killings M.D.   On: 08/14/2017 15:16     Medications:   . sodium chloride 75 mL/hr at 08/16/17 0213   . aspirin  81 mg Oral Daily  . atorvastatin  40 mg Oral q1800  . calcitRIOL  0.25 mcg Oral Q M,W,F  . citalopram  30 mg Oral Daily  . heparin  5,000 Units Subcutaneous Q8H  . metoprolol tartrate  25 mg Oral BID  . pantoprazole  40 mg Oral QAC breakfast  . tamsulosin  0.4 mg Oral QPC breakfast  . umeclidinium-vilanterol  1 puff Inhalation Daily   acetaminophen **OR** acetaminophen, albuterol, albuterol, ALPRAZolam, bisacodyl, diphenhydrAMINE, hydrALAZINE, ondansetron **OR** ondansetron (ZOFRAN) IV, polyvinyl alcohol, senna-docusate, traMADol  Assessment/ Plan:  82 y.o. 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  1.  Chronic kidney disease stage V.  Kidney function has improved a bit.  EGFR back up to 16.  However this may be delusional as he is on IV fluids.  Continue to monitor renal function trend.  No urgent indication for dialysis at the moment.  2.  Anemia chronic kidney disease.  No new hemoglobin today.  Continue to monitor CBC periodically.  3.  Hypertension.  Continue metoprolol 25 mg twice daily.  4.  Secondary hyperparathyroidism.  Continue calcitriol 0.25 mcg p.o. 3 times a week.   LOS: 2 Chantea Surace 8/2/20199:58 AM

## 2017-08-16 NOTE — Progress Notes (Signed)
Inpatient Rehabilitation  I attempted to call spouse and left her a message at home to call and discuss reahb.  Await a call back.  I also await authorization from [facility] Medicare.  Attempted to call and speak with patient about CIR; however, he was resting and daughter Almyra Free answered the phone.  Shared with her that I will plan to visit with him Monday morning 8-830 for hopeful admission 08/19/17 pending medical readiness and insurance authorization.  Call if questions.   Carmelia Roller., CCC/SLP Admission Coordinator  [place]  Cell [phone]

## 2017-08-16 NOTE — Progress Notes (Signed)
Physical Therapy Treatment Patient Details Name: [name] MRN: [id number] DOB: 1935-02-21 Today's Date: 08/16/2017    History of Present Illness Pt is 82 y.o. 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    PT Comments    Pt is progressing well towards goals today and is eager to work with PT. PT took orthostatic BP 2/2 to dizziness supine:139/77, seated:139/85, and standing 148/78. Pt had mild dizziness with seated that improved with prolonged sitting and not reported with standing. Pt was able to progress bed mobility from Sit<>supine to Min A, and transfer with Mod A x2 see below for details. Pt was limited in functional mobility because of weakness, poor motor planning, dizziness, fatigue, and lack of balance. PT noted improved crisp speech when entering room, but mild slurred speech was noted upon completion of treatment. Current recommendations remain appropriate at this time.  Follow Up Recommendations  CIR     Equipment Recommendations       Recommendations for Other Services       Precautions / Restrictions Precautions Precautions: Fall    Mobility  Bed Mobility Overal bed mobility: Needs Assistance Bed Mobility: Supine to Sit;Sit to Supine     Supine to sit: Min assist Sit to supine: Min assist   General bed mobility comments: Pt reqiured Min A at LE and trunk for Supine<>sit secondary to weakness, and poor motor planning, and control of L UE/LE.   Transfers Overall transfer level: Needs assistance Equipment used: 2 person hand held assist Transfers: Sit to/from Stand Sit to Stand: Mod assist;+2 physical assistance         General  transfer comment: Pt required Mod A x2 at gait belt with B hand hold, and blocking of L LE to limite knee buckling and ER. In standing pt had L side lean that improven with verbal cueingo mid. Pt was able to perform sit<>stand x3 with rocking 3 times before standing.  Ambulation/Gait                 Stairs             Wheelchair Mobility    Modified Rankin (Stroke Patients Only)       Balance Overall balance assessment: Needs assistance Sitting-balance support: Single extremity supported;Feet supported Sitting balance-Leahy Scale: Fair   Postural control: Right lateral lean Standing balance support: Bilateral upper extremity supported Standing balance-Leahy Scale: Poor Standing balance comment: pt weight shifts to L, but able to weight shift side to side with Mod A x2                            Cognition Arousal/Alertness: Awake/alert Behavior During Therapy: WFL for tasks assessed/performed Overall Cognitive Status: Within Functional Limits for tasks assessed                                        Exercises      General Comments        Pertinent Vitals/Pain Pain Assessment: No/denies pain    Home Living  Prior Function            PT Goals (current goals can now be found in the care plan section) Acute Rehab PT Goals Patient Stated Goal: return to PLOF PT Goal Formulation: With patient Time For Goal Achievement: 08/29/17 Potential to Achieve Goals: Fair Progress towards PT goals: Progressing toward goals    Frequency    7X/week      PT Plan Current plan remains appropriate    Co-evaluation              AM-PAC PT "6 Clicks" Daily Activity  Outcome Measure  Difficulty turning over in bed (including adjusting bedclothes, sheets and blankets)?: Unable Difficulty moving from lying on back to sitting on the side of the bed? : Unable Difficulty sitting down on and standing up  from a chair with arms (e.g., wheelchair, bedside commode, etc,.)?: Unable Help needed moving to and from a bed to chair (including a wheelchair)?: A Lot Help needed walking in hospital room?: Total Help needed climbing 3-5 steps with a railing? : Total 6 Click Score: 7    End of Session Equipment Utilized During Treatment: Gait belt Activity Tolerance: Patient limited by fatigue Patient left: in bed;with call bell/phone within reach;with bed alarm set;with family/visitor present Nurse Communication: Mobility status PT Visit Diagnosis: Unsteadiness on feet (R26.81);Other abnormalities of gait and mobility (R26.89);Ataxic gait (R26.0);Difficulty in walking, not elsewhere classified (R26.2);Apraxia (R48.2);Hemiplegia and hemiparesis Hemiplegia - Right/Left: Left Hemiplegia - dominant/non-dominant: Non-dominant Hemiplegia - caused by: Cerebral infarction     Time: 0255-0316 PT Time Calculation (min) (ACUTE ONLY): 21 min  Charges:                        Rosario Adie, SPT    Rosario Adie 08/16/2017, 3:39 PM

## 2017-08-16 NOTE — Progress Notes (Signed)
[facility] [place] NAME: [name]    MR#:  [id number]  DATE OF BIRTH:  30-Aug-1935  SUBJECTIVE:  CHIEF COMPLAINT:   Chief Complaint  Patient presents with  . Dizziness   - more alert and oriented today - LUE weakness persists  REVIEW OF SYSTEMS:  Review of Systems  Constitutional: Positive for malaise/fatigue. Negative for chills and fever.  HENT: Negative for ear discharge, hearing loss and nosebleeds.   Eyes: Negative for blurred vision and double vision.  Respiratory: Negative for cough, shortness of breath and wheezing.   Cardiovascular: Negative for chest pain and palpitations.  Gastrointestinal: Negative for abdominal pain, constipation, diarrhea, nausea and vomiting.  Genitourinary: Negative for dysuria.  Neurological: Positive for dizziness, speech change and focal weakness. Negative for sensory change, seizures and headaches.  Psychiatric/Behavioral: Negative for depression.    DRUG ALLERGIES:   Allergies  Allergen Reactions  . Ace Inhibitors     Other reaction(s): Other (See Comments) Renal failure.  . Isosorbide Nitrate     Other reaction(s): Headache Stomach ache,nausea  . Nsaids     Other reaction(s): Unknown  . Prednisone Other (See Comments)    Feels like pins/needles/sharp stick on the inside    VITALS:  Blood pressure 118/68, pulse 64, temperature 98 F (36.7 C), temperature source Oral, resp. rate 16, height 6' (1.829 m), weight 86.2 kg (190 lb), SpO2 98 %.  PHYSICAL EXAMINATION:  Physical Exam   GENERAL:  82 y.o.-year-old patient lying in the bed with no acute distress.  EYES: Pupils equal, round, reactive to light and accommodation. No scleral icterus. Extraocular muscles intact. Right eye erythematous conjunctiva HEENT: Head atraumatic, normocephalic. Oropharynx and nasopharynx clear.  NECK:  Supple, no jugular venous distention. No thyroid enlargement, no tenderness.  LUNGS: Normal breath  sounds bilaterally, no wheezing, rales,rhonchi or crepitation. No use of accessory muscles of respiration. Decreased basilar breath sounds CARDIOVASCULAR: S1, S2 normal. No  rubs, or gallops. 2/6 systolic murmur present ABDOMEN: Soft, nontender, nondistended. Bowel sounds present. No organomegaly or mass.  EXTREMITIES: No pedal edema, cyanosis, or clubbing.  NEUROLOGIC: Cranial nerves II through XII are intact. right facial droop noted. Sensation intact. LUE is 0/5 and LLE is 3+/5 weakness Gait not checked.  PSYCHIATRIC: The patient is alert and oriented x 3.  SKIN: No obvious rash, lesion, or ulcer.    LABORATORY PANEL:   CBC Recent Labs  Lab 08/15/17 0410  WBC 6.3  HGB 11.2*  HCT 32.7*  PLT 165   ------------------------------------------------------------------------------------------------------------------  Chemistries  Recent Labs  Lab 08/13/17 1344  08/16/17 0511  NA 139   < > 141  K 4.5   < > 4.8  CL 112*   < > 115*  CO2 21*   < > 21*  GLUCOSE 123*   < > 98  BUN 48*   < > 40*  CREATININE 3.96*   < > 3.29*  CALCIUM 8.7*   < > 8.3*  MG 2.1  --   --   AST 15  --   --   ALT 12  --   --   ALKPHOS 112  --   --   BILITOT 0.5  --   --    < > = values in this interval not displayed.   ------------------------------------------------------------------------------------------------------------------  Cardiac Enzymes Recent Labs  Lab 08/13/17 1344  TROPONINI <0.03   ------------------------------------------------------------------------------------------------------------------  RADIOLOGY:  US Carotid Bilateral  Result Date: 08/14/2017 CLINICAL DATA:  Stroke-like symptoms EXAM: BILATERAL CAROTID DUPLEX ULTRASOUND TECHNIQUE: Pearline Cables scale imaging, color Doppler and duplex ultrasound were performed of bilateral carotid and vertebral arteries in the neck. COMPARISON:  None. FINDINGS: Criteria: Quantification of carotid stenosis is based on velocity parameters that  correlate the residual internal carotid diameter with NASCET-based stenosis levels, using the diameter of the distal internal carotid lumen as the denominator for stenosis measurement. The following velocity measurements were obtained: RIGHT ICA:  78 cm/sec CCA:  81 cm/sec SYSTOLIC ICA/CCA RATIO:  1.0 DIASTOLIC ICA/CCA RATIO: ECA:  95 cm/sec LEFT ICA:  113 cm/sec CCA:  78 cm/sec SYSTOLIC ICA/CCA RATIO:  1.4 DIASTOLIC ICA/CCA RATIO: ECA:  143 cm/sec RIGHT CAROTID ARTERY: Moderate irregular calcified plaque in the bulb. Low resistance internal carotid Doppler pattern is preserved. RIGHT VERTEBRAL ARTERY:  Antegrade. LEFT CAROTID ARTERY: Moderate irregular calcified plaque in the bulb. Low resistance internal carotid Doppler pattern. LEFT VERTEBRAL ARTERY:  Antegrade. IMPRESSION: Less than 50% stenosis in the right and left internal carotid arteries. Electronically Signed   By: Marybelle Killings M.D.   On: 08/14/2017 15:16    EKG:  No orders found for this or any previous visit.  ASSESSMENT AND PLAN:   82 y/o Male with PMH of CKD stage 4, Hy[pertension, CAD, aortic stenosis, peripheral neuropathy presents from home connected dizziness.  1.  Acute stroke-patient presented with dizziness, overnight developed left upper and lower leg weakness.   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  Interventional Radiology was contacted by neurology-did not recommend any procedures at this time. - carotid Dopplers with no hemodynamically significant stenosis.  Echocardiogram with no cardiac source of emboli, EF of 70% and diastolic dysfunction noted -Neurology consult is appreciated -on aspirin and statin. -PT/OT and speech consults- will need inpatient rehab at discharge  2.  CKD stage IV-creatinine is stable.  Potassium slightly elevated.  Monitor urine output.  Appreciate nephrology consult  3.  Hypertension-history of  hypertension.  On low-dose Toprol which we will continue at this time  4.  GERD-Protonix  5.  DVT prophylaxis-subcutaneous heparin      All the records are reviewed and case discussed with Care Management/Social Workerr. Management plans discussed with the patient, family and they are in agreement.  CODE STATUS: Full Code  TOTAL  TIME SPENT IN TAKING CARE OF THIS PATIENT: 36 minutes.   POSSIBLE D/C IN 1-2 DAYS, DEPENDING ON CLINICAL CONDITION.   Gladstone Lighter M.D on 08/16/2017 at 12:27 PM  Between 7am to 6pm - Pager - [phone]  After 6pm go to [email or web address] - password [email or web address] [facility]  Office  [phone]  CC: Primary care physician; Rusty Aus, MD

## 2017-08-16 NOTE — Care Management (Addendum)
Spoke with [name] at the bedside. States that he would like to tramnsfer to Inpatient Acute Care at [facility]. Will update [name], Rehab admission representative. States she will start the authorization process for [name] to go to [place]. Shelbie Ammons RN MSN CCM Care Management [phone]

## 2017-08-16 NOTE — Evaluation (Signed)
Occupational Therapy Evaluation Patient Details Name: [name] MRN: [id number] DOB: [birth year] Today's Date: 08/16/2017    History of Present Illness Pt is 82 y.o. 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   Clinical Impression   Pt seen for OT evaluation this date. Pt is a pleasant 82 year old male who was admitted for acute on chronic renal failure and then found to have Medullary CVA with left sided weakness. Prior to hospital admission, pt was independent in all aspects of ADL/IADL and living with his spouse in their 1 story home of 60+ years with 1 step to enter with L hand rail. Pt alert and oriented, following all commands, eager to work with OT. Pt performed bed mobility with min-mod assist for trunk stabilization to come sit EOB. Pt endorsing dizziness upon sitting. BP 118/68, HR 64. Pt notes dizziness worsens with head turn to far R and decreases slightly with increased time sitting EOB. Pt participated in weight shifting and UE reaching activity with pt weightbearing through LUE on EOB to reach overhead to L with RUE and visa versa, requiring initial mod-max decreasing to mod assist for LUE support to perform to R side. Noted improvement in sitting balance after activity, requiring close supervision to CGA for sitting balance. Pt demonstrated good awareness of LUE and occasionally would reach and assist LUE in positioning using his RUE to maximize joint protection and skin integrity. Pt required min Ax2 for sit>sup. Pt educated in LUE positioning and SROM to support optimal functional positioning of LUE. Pt able to return demo learned techniques. Pt demonstrates  decreased coordination, strength, and sensation in LUE>LLE impairing his ability to perform mobility and ADL tasks at baseline independence. Pt demonstrated no difficulty with tracking to L side. Endorsed double vision overnight while attempting to look at the clock but did not endorse diplopia at time of OT evaluation. He notes that he has been having on/off blurriness in B eyes over the past couple days as well, but not at time of OT evaluation. Pt will benefit from skilled OT services to maximize return to PLOF in order to return to baseline independence. Recommend acute in-patient rehab at this time (CIR).     Follow Up Recommendations  CIR    Equipment Recommendations  Other (comment)(TBD)    Recommendations for Other Services Rehab consult     Precautions / Restrictions Precautions Precautions: Fall Restrictions Weight Bearing Restrictions: No      Mobility Bed Mobility Overal bed mobility: Needs Assistance Bed Mobility: Supine to Sit;Sit to Supine     Supine to sit: Min assist;Mod assist Sit to supine: Min assist;+2 for physical assistance   General bed mobility comments: min-mod assist x1 with additional time and cues to perform sup>sit EOB 2/2 weakness and poor motor planning and control of LUE  Transfers Overall transfer level: Needs assistance Equipment used: None Transfers: Lateral/Scoot Transfers          Lateral/Scoot Transfers: Min assist;Mod assist General transfer comment: min-mod assist for lateral scoots EOB, unable to attempt standing 2/2 dizziness    Balance Overall balance assessment: Needs assistance Sitting-balance support: Single extremity supported;Bilateral upper extremity supported;Feet supported;Feet unsupported Sitting balance-Leahy Scale: Fair Sitting balance - Comments: slight elongation of the L lateral trunk causing slight lean  to R which improved with L/R reaching while crossing midline activity, intermittent CGA for sitting  balance Postural control: Right lateral lean                                 ADL either performed or assessed with clinical judgement   ADL Overall ADL's : Needs assistance/impaired Eating/Feeding: Bed level;Set up Eating/Feeding Details (indicate cue type and reason): set up of containers/packets, able to eat with R dom hand Grooming: Sitting;Min guard Grooming Details (indicate cue type and reason): CGA in sitting while using R dom hand, requires Mod-max assist to incorporate LUE into task Upper Body Bathing: Sitting;Minimal assistance;Moderate assistance   Lower Body Bathing: Sitting/lateral leans;Moderate assistance;Maximal assistance   Upper Body Dressing : Sitting;Moderate assistance   Lower Body Dressing: Sitting/lateral leans;Maximal assistance;Moderate assistance                       Vision Baseline Vision/History: Wears glasses Patient Visual Report: Diplopia;Blurring of vision(double vision last night and blurry vision, both "come and go") Vision Assessment?: Yes Eye Alignment: Within Functional Limits Ocular Range of Motion: Within Functional Limits Alignment/Gaze Preference: Within Defined Limits Tracking/Visual Pursuits: Able to track stimulus in all quads without difficulty Visual Fields: No apparent deficits Diplopia Assessment: Other (comment);Objects split on top of one another(pt reports clock on wall split on top of one another last night... no double vision at time of initial OT evaluation)     Perception     Praxis      Pertinent Vitals/Pain Pain Assessment: No/denies pain     Hand Dominance Right   Extremity/Trunk Assessment Upper Extremity Assessment Upper Extremity Assessment: LUE deficits/detail(RUE WFL) LUE Deficits / Details: 3-/5 shoulder elevation, elbow flex/ext 2/5, wrist flex/ext 3-/5, composite finger flex/ext 3/5  LUE Sensation: decreased light touch LUE Coordination: decreased gross motor;decreased fine motor    Lower Extremity Assessment Lower Extremity Assessment: LLE deficits/detail(RLE WFL) LLE Deficits / Details: able to perform SLR without difficulty, grossly at least 3+/5, gross motor function and coordination deficits LLE Coordination: decreased fine motor;decreased gross motor   Cervical / Trunk Assessment Cervical / Trunk Assessment: Other exceptions Cervical / Trunk Exceptions: slight elongation of L lateral trunk with improvement noted during bilateral reaching task   Communication Communication Communication: Expressive difficulties(slightly slurred speech)   Cognition Arousal/Alertness: Awake/alert Behavior During Therapy: WFL for tasks assessed/performed Overall Cognitive Status: Within Functional Limits for tasks assessed                                 General Comments: alert and oriented, follows commands well, demonstrates good safety awareness of LUE and makes attempts to adjust it's position using RUE to ensure safety/joint protection without verbal cues   General Comments       Exercises Other Exercises Other Exercises: Pt participated in sitting and reaching task involving weight shifting L and R while weight bearing through LUE and reaching overhead to L side with RUE and then with initial mod-max assist to reach overhead to R with LUE decreasing to mod assist with repetition. Sitting balance noted to improve slightly during activity. Other Exercises: Pt instructed in positioning of LUE to support skin integrity and joint protection. Pillow placed. Pt able to return demo L hand stretching and ROM using R hand to support optimal functional positioning   Shoulder Instructions  Home Living Family/patient expects to be discharged to:: Private residence Living Arrangements: Spouse/significant other Available Help at Discharge: Family(wife is limited in physical assist, other family intermitent) Type of Home: House Home Access: Stairs to enter [employer] of Steps: 1 Entrance Stairs-Rails: Left Home Layout: One level     Bathroom Shower/Tub: Teacher, early years/pre: Standard Bathroom Accessibility: Yes How Accessible: Accessible via walker Home Equipment: [place] - 2 wheels;Walker - 4 wheels;Cane - quad;Bedside commode          Prior Functioning/Environment Level of Independence: Independent        Comments: community ambulator, indep with ADL and IADL        OT Problem List: Decreased strength;Decreased knowledge of use of DME or AE;Decreased range of motion;Decreased coordination;Impaired UE functional use;Impaired sensation;Impaired balance (sitting and/or standing)      OT Treatment/Interventions: Balance training;Therapeutic exercise;Self-care/ADL training;Neuromuscular education;Therapeutic activities;DME and/or AE instruction;Patient/family education    OT Goals(Current goals can be found in the care plan section) Acute Rehab OT Goals Patient Stated Goal: return to PLOF OT Goal Formulation: With patient Time For Goal Achievement: 08/30/17 Potential to Achieve Goals: Good ADL Goals Pt Will Perform Grooming: with min assist;sitting(incorporating LUE) Pt Will Perform Upper Body Dressing: with min assist;sitting(using learned hemi techniques) Pt Will Transfer to Toilet: with min assist;with +2 assist;ambulating;bedside commode(LRAD for amb) Additional ADL Goal #1: Pt will perform table top FM/GM tasks using LUE with min assist to support ADL participation.  OT Frequency: Min 3X/week   Barriers to D/C:            Co-evaluation              AM-PAC PT "6 Clicks" Daily Activity     Outcome Measure Help from another person eating meals?: A Little Help from another person taking care of personal grooming?: A Little Help from another person toileting, which includes using toliet, bedpan, or urinal?: A Lot Help from another person bathing (including washing, rinsing, drying)?: A Lot Help  from another person to put on and taking off regular upper body clothing?: A Lot Help from another person to put on and taking off regular lower body clothing?: A Lot 6 Click Score: 14   End of Session Equipment Utilized During Treatment: Gait belt  Activity Tolerance: Patient tolerated treatment well Patient left: in bed;with call bell/phone within reach;with bed alarm set  OT Visit Diagnosis: Other abnormalities of gait and mobility (R26.89);Hemiplegia and hemiparesis;Dizziness and giddiness (R42) Hemiplegia - Right/Left: Left Hemiplegia - dominant/non-dominant: Non-Dominant Hemiplegia - caused by: Cerebral infarction                Time: 7062-3762 OT Time Calculation (min): 29 min Charges:  OT General Charges $OT Visit: 1 Visit OT Evaluation $OT Eval High Complexity: 1 High OT Treatments $Neuromuscular Re-education: 8-22 mins  Jeni Salles, MPH, MS, OTR/L ascom [phone] 08/16/17, 10:08 AM

## 2017-08-16 NOTE — Plan of Care (Signed)
  Problem: Education: Goal: Knowledge of General Education information will improve Description Including pain rating scale, medication(s)/side effects and non-pharmacologic comfort measures Outcome: Progressing   Problem: Health Behavior/Discharge Planning: Goal: Ability to manage health-related needs will improve Outcome: Progressing   Problem: Clinical Measurements: Goal: Ability to maintain clinical measurements within normal limits will improve Outcome: Progressing Goal: Will remain free from infection Outcome: Progressing Goal: Diagnostic test results will improve Outcome: Progressing Goal: Respiratory complications will improve Outcome: Progressing Goal: Cardiovascular complication will be avoided Outcome: Progressing   Problem: Activity: Goal: Risk for activity intolerance will decrease Outcome: Progressing   Problem: Nutrition: Goal: Adequate nutrition will be maintained Outcome: Progressing   Problem: Coping: Goal: Level of anxiety will decrease Outcome: Progressing   Problem: Elimination: Goal: Will not experience complications related to bowel motility Outcome: Progressing Goal: Will not experience complications related to urinary retention Outcome: Progressing   Problem: Safety: Goal: Ability to remain free from injury will improve Outcome: Progressing   Problem: Skin Integrity: Goal: Risk for impaired skin integrity will decrease Outcome: Progressing   Problem: Education: Goal: Knowledge of disease or condition will improve Outcome: Progressing Goal: Knowledge of secondary prevention will improve Outcome: Progressing Goal: Knowledge of patient specific risk factors addressed and post discharge goals established will improve Outcome: Progressing Goal: Individualized Educational Video(s) Outcome: Progressing   Problem: Self-Care: Goal: Ability to participate in self-care as condition permits will improve Outcome: Progressing Goal: Verbalization  of feelings and concerns over difficulty with self-care will improve Outcome: Progressing Goal: Ability to communicate needs accurately will improve Outcome: Progressing   Problem: Nutrition: Goal: Risk of aspiration will decrease Outcome: Progressing Goal: Dietary intake will improve Outcome: Progressing   Problem: Ischemic Stroke/TIA Tissue Perfusion: Goal: Complications of ischemic stroke/TIA will be minimized Outcome: Progressing

## 2017-08-17 MED ORDER — GABAPENTIN 100 MG PO CAPS
200.0000 mg | ORAL_CAPSULE | Freq: Two times a day (BID) | ORAL | Status: DC
Start: 2017-08-17 — End: 2017-08-19
  Administered 2017-08-17 – 2017-08-19 (×5): 200 mg via ORAL
  Filled 2017-08-17 (×5): qty 2

## 2017-08-17 MED ORDER — ALUM & MAG HYDROXIDE-SIMETH 200-200-20 MG/5ML PO SUSP
30.0000 mL | Freq: Four times a day (QID) | ORAL | Status: DC | PRN
  Administered 2017-08-17: 30 mL via ORAL
  Filled 2017-08-17: qty 30

## 2017-08-17 MED ORDER — ZOLPIDEM TARTRATE 5 MG PO TABS
5.0000 mg | ORAL_TABLET | Freq: Every evening | ORAL | Status: DC | PRN
  Administered 2017-08-18: 21:00:00 5 mg via ORAL
  Filled 2017-08-17: qty 1

## 2017-08-17 MED ORDER — AMOXICILLIN-POT CLAVULANATE 500-125 MG PO TABS
500.0000 mg | ORAL_TABLET | Freq: Two times a day (BID) | ORAL | Status: DC
  Administered 2017-08-17 – 2017-08-19 (×5): 500 mg via ORAL
  Filled 2017-08-17 (×6): qty 1

## 2017-08-17 NOTE — Progress Notes (Signed)
[facility]  ROUNDING NOTE   Subjective:  Patient seen at bedside. Appears to be doing a bit better. Urine output 1.5 L over the preceding 24 hours. Creatinine down to 3.29.    Objective:  Vital signs in last 24 hours:  Temp:  [97.8 F (36.6 C)-98.5 F (36.9 C)] 98 F (36.7 C) (08/03 0800) Pulse Rate:  [63-71] 71 (08/03 0800) Resp:  [16-18] 16 (08/03 0800) BP: (126-151)/(73-94) 151/85 (08/03 0800) SpO2:  [96 %-99 %] 98 % (08/03 0800)  Weight change:  Filed Weights   08/13/17 1345  Weight: 86.2 kg (190 lb)    Intake/Output: I/O last 3 completed shifts: In: 360 [P.O.:360] Out: 1780 [Urine:1780]   Intake/Output this shift:  Total I/O In: 220 [P.O.:220] Out: 200 [Urine:200]  Physical Exam: General: No acute distress  Head: Normocephalic, atraumatic. Moist oral mucosal membranes  Eyes: Anicteric  Neck: Supple, trachea midline  Lungs:  Clear to auscultation, normal effort  Heart: S1S2 no rubs  Abdomen:  Soft, nontender, bowel sounds present  Extremities: Trace peripheral edema.  Neurologic: Left sided weakness noted, awake, alert  Skin: No lesions       Basic Metabolic Panel: Recent Labs  Lab 08/13/17 1344 08/14/17 0330 08/15/17 0410 08/16/17 0511  NA 139 139 139 141  K 4.5 5.1 5.1 4.8  CL 112* 113* 113* 115*  CO2 21* 22 22 21*  GLUCOSE 123* 103* 97 98  BUN 48* 47* 44* 40*  CREATININE 3.96* 3.72* 3.77* 3.29*  CALCIUM 8.7* 8.5* 8.5* 8.3*  MG 2.1  --   --   --     Liver Function Tests: Recent Labs  Lab 08/13/17 1344  AST 15  ALT 12  ALKPHOS 112  BILITOT 0.5  PROT 7.3  ALBUMIN 4.0   No results for input(s): LIPASE, AMYLASE in the last 168 hours. No results for input(s): AMMONIA in the last 168 hours.  CBC: Recent Labs  Lab 08/13/17 1344 08/15/17 0410  WBC 6.9 6.3  NEUTROABS 4.9  --   HGB 12.5* 11.2*  HCT 36.9* 32.7*  MCV 84.9 84.4  PLT 184 165    Cardiac Enzymes: Recent Labs  Lab 08/13/17 1344  TROPONINI <0.03     BNP: Invalid input(s): POCBNP  CBG: No results for input(s): GLUCAP in the last 168 hours.  Microbiology: No results found for this or any previous visit.  Coagulation Studies: No results for input(s): LABPROT, INR in the last 72 hours.  Urinalysis: No results for input(s): COLORURINE, LABSPEC, PHURINE, GLUCOSEU, HGBUR, BILIRUBINUR, KETONESUR, PROTEINUR, UROBILINOGEN, NITRITE, LEUKOCYTESUR in the last 72 hours.  Invalid input(s): APPERANCEUR    Imaging: No results found.   Medications:    . amoxicillin-clavulanate  500 mg Oral Q12H  . aspirin  81 mg Oral Daily  . atorvastatin  40 mg Oral q1800  . calcitRIOL  0.25 mcg Oral Q M,W,F  . citalopram  30 mg Oral Daily  . gabapentin  200 mg Oral BID  . heparin  5,000 Units Subcutaneous Q8H  . metoprolol tartrate  25 mg Oral BID  . pantoprazole  40 mg Oral QAC breakfast  . tamsulosin  0.4 mg Oral QPC breakfast  . umeclidinium-vilanterol  1 puff Inhalation Daily   acetaminophen **OR** acetaminophen, albuterol, albuterol, ALPRAZolam, bisacodyl, diphenhydrAMINE, hydrALAZINE, ondansetron **OR** ondansetron (ZOFRAN) IV, polyvinyl alcohol, senna-docusate, traMADol  Assessment/ Plan:  82 y.o. 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  1.  Chronic kidney disease stage V.  Kidney function slightly improved.  EGFR up to 16.  No urgent indication for dialysis.  We will go ahead and discontinue IV fluids as he is taking p.o.  2.  Anemia chronic kidney disease.  Hemoglobin 11.2 at last check.  No indication for Procrit.  3.  Hypertension.  Continue metoprolol 25 mg twice daily.  4.  Secondary  hyperparathyroidism.  Maintain Calcitrol 0.25 mcg p.o. 3 times per week.   LOS: 3 [name] 8/3/201911:18 AM

## 2017-08-17 NOTE — Progress Notes (Signed)
Occupational Therapy Treatment Patient Details Name: [name] MRN: [id number] DOB: [birth year] Today's Date: 08/17/2017    History of present illness Pt is 82 y.o. 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   OT comments  Pt initially reluctant to participate with therapist stating he had already sat up in chair for 2 hours this morning. Several family members present in room at this time, and OT requests to provide education regarding bed positioning and UB dressing strategies and family as well as pt agreeable. Demonstration of L UE ROM provided as well as education about the importance. OT provides demo regarding UB dressing utilizing hemi dressing technique. Pt and family verbalize understanding of L UE positioning, but would likely benefit from continued skilled OT to address hemi-dressing strategies as well as modifications for other ADLs and ADL mobility. Overall, pt tolerated treatment well.   Follow Up Recommendations  CIR    Equipment Recommendations       Recommendations for Other Services Rehab consult    Precautions / Restrictions Precautions Precautions: Fall Restrictions Weight Bearing Restrictions: No       Mobility Bed Mobility        Transfers     Balance                            ADL either performed or assessed with clinical judgement   ADL Overall ADL's : Needs assistance/impaired                 Upper Body Dressing : Sitting;Moderate assistance Upper Body Dressing Details (indicate cue type and reason): demo provided re: hemi-dressing technique                         Vision Baseline  Vision/History: Wears glasses Patient Visual Report: Diplopia;Blurring of vision     Perception     Praxis      Cognition Arousal/Alertness: Awake/alert Behavior During Therapy: WFL for tasks assessed/performed Overall Cognitive Status: Within Functional Limits for tasks assessed                                          Exercises Other Exercises Other Exercises: Pt and family educated about L UE positioning to prevent edema as well as participated in demo of ROM with education about prevention of contracture. Pt participates in ROM of LUE to his tolerance completing ~1/8 arc of motion shld flexion in supine position, wiggles fingers with increased prevalence noted for thumb, second and third digit.    Shoulder Instructions       General Comments      Pertinent Vitals/ Pain       Pain Assessment: No/denies pain  Home Living                                          Prior Functioning/Environment              Frequency  Min 1X/week        Progress Toward  Goals  OT Goals(current goals can now be found in the care plan section)  Progress towards OT goals: Progressing toward goals  Acute Rehab OT Goals Patient Stated Goal: return to PLOF OT Goal Formulation: With patient Time For Goal Achievement: 08/30/17 Potential to Achieve Goals: Good  Plan Discharge plan remains appropriate    Co-evaluation                 AM-PAC PT "6 Clicks" Daily Activity     Outcome Measure   Help from another person eating meals?: A Little Help from another person taking care of personal grooming?: A Little Help from another person toileting, which includes using toliet, bedpan, or urinal?: A Lot Help from another person bathing (including washing, rinsing, drying)?: A Lot Help from another person to put on and taking off regular upper body clothing?: A Lot Help from another person to put on and taking off regular lower body clothing?: A  Lot 6 Click Score: 14    End of Session    OT Visit Diagnosis: Other abnormalities of gait and mobility (R26.89);Hemiplegia and hemiparesis;Dizziness and giddiness (R42) Hemiplegia - Right/Left: Left Hemiplegia - dominant/non-dominant: Non-Dominant Hemiplegia - caused by: Cerebral infarction   Activity Tolerance Patient tolerated treatment well   Patient Left in bed;with call bell/phone within reach;with bed alarm set   Nurse Communication          Time: 3354-5625 OT Time Calculation (min): 13 min  Charges: OT General Charges $OT Visit: 1 Visit OT Treatments $Self Care/Home Management : 8-22 mins  Gerrianne Scale, MS, OTR/L ascom [phone] or [phone] 08/17/17, 12:50 PM

## 2017-08-17 NOTE — Plan of Care (Signed)

## 2017-08-17 NOTE — Progress Notes (Signed)
Physical Therapy Treatment Patient Details Name: [name] MRN: [id number] DOB: 1935-07-19 Today's Date: 08/17/2017    History of Present Illness Pt is 82 y.o. 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    PT Comments    Pt in recliner upon arrival slouching with difficulty changing positions.  Pt assisted in repositioning to a safer position.  Pt stated he has been out of bed for about 2 hours this morning and generally fatigued.  Feet lowered and unsupported sitting balance activities x 3 minutes while waiting for assistance to transfer.  Pt was able to stand with mod a x 2 and transfer to bed with mod a x 2.  Pt condom cath leaking and he remained upright for it's removal and care.  Returned to supine with mod a x 2 and positioned for comfort.  Overall tolerated out of bed and transfer well today.  Pt reports poor sleep last night due to constipation and BM early this morning which he feels is affecting his ability to participate longer in therapy this am.     Follow Up Recommendations  CIR     Equipment Recommendations       Recommendations for Other Services       Precautions / Restrictions Precautions Precautions: Fall Restrictions Weight Bearing Restrictions: No    Mobility  Bed Mobility Overal bed mobility: Needs Assistance Bed Mobility: Sit to Supine     Supine to sit: Mod assist;+2 for physical assistance        Transfers Overall transfer level: Needs assistance Equipment used: 2 person hand held assist Transfers: Sit to/from [employer] Transfers Sit to Stand: Mod assist;+2 physical assistance Stand pivot transfers: Mod assist;+2  physical assistance       General transfer comment: Pt up in chair x 2 hour and reports fatigue wanting to return to bed upon arrival.  Ambulation/Gait                 Stairs             Wheelchair Mobility    Modified Rankin (Stroke Patients Only)       Balance Overall balance assessment: Needs assistance Sitting-balance support: Single extremity supported;Feet supported Sitting balance-Leahy Scale: Fair Sitting balance - Comments: Pt requires close supervision and not safe to be left unattended.  Can sit for short periods of time but fatigues quickly unsupported.   Standing balance support: Bilateral upper extremity supported Standing balance-Leahy Scale: Poor Standing balance comment: pt weight shifts to L, but able to weight shift side to side with Mod A x2                            Cognition Arousal/Alertness: Awake/alert Behavior During Therapy: WFL for tasks assessed/performed Overall Cognitive Status: Within Functional Limits for tasks assessed                                        Exercises Other Exercises Other Exercises: unsupported sitting 3 mintues x 2.    General Comments        Pertinent Vitals/Pain Pain Assessment: No/denies pain    Home  Living                      Prior Function            PT Goals (current goals can now be found in the care plan section) Progress towards PT goals: Progressing toward goals    Frequency    7X/week      PT Plan Current plan remains appropriate    Co-evaluation              AM-PAC PT "6 Clicks" Daily Activity  Outcome Measure  Difficulty turning over in bed (including adjusting bedclothes, sheets and blankets)?: Unable Difficulty moving from lying on back to sitting on the side of the bed? : Unable Difficulty sitting down on and standing up from a chair with arms (e.g., wheelchair, bedside commode, etc,.)?: Unable Help needed moving to and  from a bed to chair (including a wheelchair)?: A Lot Help needed walking in hospital room?: Total Help needed climbing 3-5 steps with a railing? : Total 6 Click Score: 7    End of Session Equipment Utilized During Treatment: Gait belt Activity Tolerance: Patient limited by fatigue Patient left: in bed;with bed alarm set;with call bell/phone within reach;with family/visitor present   Hemiplegia - Right/Left: Left Hemiplegia - dominant/non-dominant: Non-dominant Hemiplegia - caused by: Cerebral infarction     Time: 1040-1055 PT Time Calculation (min) (ACUTE ONLY): 15 min  Charges:  $Therapeutic Activity: 8-22 mins                     Chesley Noon, PTA 08/17/17, 11:35 AM

## 2017-08-17 NOTE — Progress Notes (Signed)
PHARMACY NOTE -  ANTIBIOTIC RENAL DOSE ADJUSTMENT   Pharmacy to assist with antibiotic renal dose adjustment per protocol.  Patient has been initiated on Augmentin 875 Q12H for abscess. SCr 3.29 mg/dL, estimated CrCl 20 to 25 ml/min Current dosage is not appropriate Change to Augmentin 500 mg po Q12H.  Arla Boutwell A. [place], [place].D., BCPS Clinical Pharmacist 08/17/2017 08:28

## 2017-08-17 NOTE — Progress Notes (Signed)
[place] [facility] [place] NAME: [name]    MR#:  [id number]  DATE OF BIRTH:  06/12/1935  SUBJECTIVE:  CHIEF COMPLAINT:   Chief Complaint  Patient presents with  . Dizziness   -Patient is more alert and is sitting in the chair today.  Persistent left upper extremity weakness.  Awaiting discharge to inpatient rehab pending insurance authorization. -Occasional tingling numbness and heaviness of left upper extremity noted  REVIEW OF SYSTEMS:  Review of Systems  Constitutional: Negative for chills, fever and malaise/fatigue.  HENT: Negative for ear discharge, hearing loss and nosebleeds.   Eyes: Negative for blurred vision and double vision.  Respiratory: Negative for cough, shortness of breath and wheezing.   Cardiovascular: Negative for chest pain and palpitations.  Gastrointestinal: Negative for abdominal pain, constipation, diarrhea, nausea and vomiting.  Genitourinary: Negative for dysuria.  Neurological: Positive for speech change and focal weakness. Negative for dizziness, sensory change, seizures and headaches.  Psychiatric/Behavioral: Negative for depression.    DRUG ALLERGIES:   Allergies  Allergen Reactions  . Ace Inhibitors     Other reaction(s): Other (See Comments) Renal failure.  . Isosorbide Nitrate     Other reaction(s): Headache Stomach ache,nausea  . Nsaids     Other reaction(s): Unknown  . Prednisone Other (See Comments)    Feels like pins/needles/sharp stick on the inside    VITALS:  Blood pressure (!) 151/85, pulse 71, temperature 98 F (36.7 C), temperature source Oral, resp. rate 16, height 6' (1.829 m), weight 86.2 kg (190 lb), SpO2 98 %.  PHYSICAL EXAMINATION:  Physical Exam   GENERAL:  82 y.o.-year-old patient lying in the bed with no acute distress.  EYES: Pupils equal, round, reactive to light and accommodation. No scleral icterus. Extraocular muscles intact. Right eye erythematous  conjunctiva HEENT: Head atraumatic, normocephalic. Oropharynx and nasopharynx clear.  NECK:  Supple, no jugular venous distention. No thyroid enlargement, no tenderness.  LUNGS: Normal breath sounds bilaterally, no wheezing, rales,rhonchi or crepitation. No use of accessory muscles of respiration. Decreased basilar breath sounds CARDIOVASCULAR: S1, S2 normal. No  rubs, or gallops. 2/6 systolic murmur present ABDOMEN: Soft, nontender, nondistended. Bowel sounds present. No organomegaly or mass.  EXTREMITIES: No pedal edema, cyanosis, or clubbing.  NEUROLOGIC: Cranial nerves II through XII are intact. right facial droop noted. Sensation intact. LUE is 0/5 and LLE is 3+/5 weakness Gait not checked.  PSYCHIATRIC: The patient is alert and oriented x 3.  SKIN: No obvious rash, lesion, or ulcer.    LABORATORY PANEL:   CBC Recent Labs  Lab 08/15/17 0410  WBC 6.3  HGB 11.2*  HCT 32.7*  PLT 165   ------------------------------------------------------------------------------------------------------------------  Chemistries  Recent Labs  Lab 08/13/17 1344  08/16/17 0511  NA 139   < > 141  K 4.5   < > 4.8  CL 112*   < > 115*  CO2 21*   < > 21*  GLUCOSE 123*   < > 98  BUN 48*   < > 40*  CREATININE 3.96*   < > 3.29*  CALCIUM 8.7*   < > 8.3*  MG 2.1  --   --   AST 15  --   --   ALT 12  --   --   ALKPHOS 112  --   --   BILITOT 0.5  --   --    < > = values in this interval not displayed.   ------------------------------------------------------------------------------------------------------------------  Cardiac  Enzymes Recent Labs  Lab 08/13/17 1344  TROPONINI <0.03   ------------------------------------------------------------------------------------------------------------------  RADIOLOGY:  No results found.  EKG:  No orders found for this or any previous visit.  ASSESSMENT AND PLAN:   82 y/o Male with PMH of CKD stage 4, Hy[pertension, CAD, aortic stenosis,  peripheral neuropathy presents from home connected dizziness.  1.  Acute stroke-patient presented with dizziness, overnight developed left upper and lower leg weakness.   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  Interventional Radiology was contacted by neurology-did not recommend any procedures at this time. - carotid Dopplers with no hemodynamically significant stenosis.  Echocardiogram with no cardiac source of emboli, EF of 24% and diastolic dysfunction noted -Neurology consult is appreciated -on aspirin and statin. -PT/OT and speech consults- will need inpatient rehab at discharge -Awaiting insurance authorization.  Possible discharge Monday  2.  CKD stage IV-creatinine is stable.  Potassium is within normal limits.  Monitor urine output.  Appreciate nephrology consult -No acute indication for hemodialysis at this time  3.  Hypertension-history of hypertension.  On low-dose Toprol which we will continue at this time  4.  GERD-Protonix  5.  DVT prophylaxis-subcutaneous heparin  6.  Left upper extremity paresthesias-secondary to stroke.  Add low-dose gabapentin.  Monitor with his poor renal function     All the records are reviewed and case discussed with Care Management/Social Workerr. Management plans discussed with the patient, family and they are in agreement.  CODE STATUS: Full Code  TOTAL  TIME SPENT IN TAKING CARE OF THIS PATIENT: 36 minutes.   POSSIBLE D/C IN 1-2 DAYS, DEPENDING ON CLINICAL CONDITION.   Truth Wolaver M.D on 08/17/2017 at 11:01 AM  Between 7am to 6pm - Pager - [phone]  After 6pm go to [email or web address] - password [email or web address] [facility]  Office  [phone]  CC: Primary care physician; Rusty Aus, MD

## 2017-08-18 LAB — BASIC METABOLIC PANEL
ANION GAP: 4 — AB (ref 5–15)
BUN: 35 mg/dL — AB (ref 8–23)
CO2: 22 mmol/L (ref 22–32)
Calcium: 8.4 mg/dL — ABNORMAL LOW (ref 8.9–10.3)
Chloride: 116 mmol/L — ABNORMAL HIGH (ref 98–111)
Creatinine, Ser: 3.03 mg/dL — ABNORMAL HIGH (ref 0.61–1.24)
GFR, EST AFRICAN AMERICAN: 21 mL/min — AB (ref >60)
GFR, EST NON AFRICAN AMERICAN: 18 mL/min — AB (ref >60)
Glucose, Bld: 101 mg/dL — ABNORMAL HIGH (ref 70–99)
POTASSIUM: 5.1 mmol/L (ref 3.5–5.1)
Sodium: 142 mmol/L (ref 135–145)

## 2017-08-18 MED ORDER — AMLODIPINE BESYLATE 5 MG PO TABS
5.0000 mg | ORAL_TABLET | Freq: Every day | ORAL | Status: DC
  Filled 2017-08-18 (×2): qty 1

## 2017-08-18 NOTE — Progress Notes (Signed)
[facility] [place] NAME: [name]    MR#:  [id number]  DATE OF BIRTH:  08-29-1935  SUBJECTIVE:  CHIEF COMPLAINT:   Chief Complaint  Patient presents with  . Dizziness   - tired and sleeping today - left sided weakness from stroke, for acute inpatient rehab  REVIEW OF SYSTEMS:  Review of Systems  Constitutional: Negative for chills, fever and malaise/fatigue.  HENT: Negative for ear discharge, hearing loss and nosebleeds.   Eyes: Negative for blurred vision and double vision.  Respiratory: Negative for cough, shortness of breath and wheezing.   Cardiovascular: Negative for chest pain and palpitations.  Gastrointestinal: Negative for abdominal pain, constipation, diarrhea, nausea and vomiting.  Genitourinary: Negative for dysuria.  Neurological: Positive for speech change and focal weakness. Negative for dizziness, sensory change, seizures and headaches.  Psychiatric/Behavioral: Negative for depression.    DRUG ALLERGIES:   Allergies  Allergen Reactions  . Ace Inhibitors     Other reaction(s): Other (See Comments) Renal failure.  . Isosorbide Nitrate     Other reaction(s): Headache Stomach ache,nausea  . Nsaids     Other reaction(s): Unknown  . Prednisone Other (See Comments)    Feels like pins/needles/sharp stick on the inside    VITALS:  Blood pressure (!) 170/85, pulse (!) 58, temperature 97.8 F (36.6 C), temperature source Oral, resp. rate 18, height 6' (1.829 m), weight 86.2 kg (190 lb), SpO2 96 %.  PHYSICAL EXAMINATION:  Physical Exam   GENERAL:  82 y.o.-year-old patient lying in the bed with no acute distress.  EYES: Pupils equal, round, reactive to light and accommodation. No scleral icterus. Extraocular muscles intact. Right eye erythematous conjunctiva HEENT: Head atraumatic, normocephalic. Oropharynx and nasopharynx clear.  NECK:  Supple, no jugular venous distention. No thyroid enlargement, no  tenderness.  LUNGS: Normal breath sounds bilaterally, no wheezing, rales,rhonchi or crepitation. No use of accessory muscles of respiration. Decreased basilar breath sounds CARDIOVASCULAR: S1, S2 normal. No  rubs, or gallops. 2/6 systolic murmur present ABDOMEN: Soft, nontender, nondistended. Bowel sounds present. No organomegaly or mass.  EXTREMITIES: No pedal edema, cyanosis, or clubbing.  NEUROLOGIC: Cranial nerves II through XII are intact. right facial droop noted. Sensation intact. LUE is 0/5 and LLE is 3+/5 weakness Gait not checked.  PSYCHIATRIC: The patient is sleepy, easily arousable and oriented  SKIN: No obvious rash, lesion, or ulcer.    LABORATORY PANEL:   CBC Recent Labs  Lab 08/15/17 0410  WBC 6.3  HGB 11.2*  HCT 32.7*  PLT 165   ------------------------------------------------------------------------------------------------------------------  Chemistries  Recent Labs  Lab 08/13/17 1344  08/18/17 0451  NA 139   < > 142  K 4.5   < > 5.1  CL 112*   < > 116*  CO2 21*   < > 22  GLUCOSE 123*   < > 101*  BUN 48*   < > 35*  CREATININE 3.96*   < > 3.03*  CALCIUM 8.7*   < > 8.4*  MG 2.1  --   --   AST 15  --   --   ALT 12  --   --   ALKPHOS 112  --   --   BILITOT 0.5  --   --    < > = values in this interval not displayed.   ------------------------------------------------------------------------------------------------------------------  Cardiac Enzymes Recent Labs  Lab 08/13/17 1344  TROPONINI <0.03   ------------------------------------------------------------------------------------------------------------------  RADIOLOGY:  No results found.  EKG:  No orders found for this or any previous visit.  ASSESSMENT AND PLAN:   82 y/o Male with PMH of CKD stage 4, Hy[pertension, CAD, aortic stenosis, peripheral neuropathy presents from home connected dizziness.  1.  Acute stroke-patient presented with dizziness, then developed left upper and lower  leg weakness.   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  Interventional Radiology was contacted by neurology-did not recommend any procedures at this time. - carotid Dopplers with no hemodynamically significant stenosis.  Echocardiogram with no cardiac source of emboli, EF of 41% and diastolic dysfunction noted -Neurology consult is appreciated -on aspirin and statin. -PT/OT and speech consults- will need inpatient rehab at discharge -Awaiting insurance authorization.  Possible discharge Monday  2.  CKD stage IV-creatinine is stable.  Potassium is slightly elevated to higher side of normal. -  Appreciate nephrology consult - consider veltassa for elevated potassium levels-  3.  Hypertension- on low-dose metoprolol, add norvasc as well  4.  GERD-Protonix  5.  DVT prophylaxis-subcutaneous heparin  6.  Left upper extremity paresthesias-secondary to stroke.  Added low-dose gabapentin.  Monitor with his poor renal function  7. Small peri anal abscess-draining abscess noted which is acute on chronic.  Dressing changes as recommended. -On Augmentin  Physical therapy recommended acute inpatient rehab.  Likely discharge tomorrow     All the records are reviewed and case discussed with Care Management/Social Workerr. Management plans discussed with the patient, family and they are in agreement.  CODE STATUS: Full Code  TOTAL  TIME SPENT IN TAKING CARE OF THIS PATIENT: 35 minutes.   POSSIBLE D/C IN 1-2 DAYS, DEPENDING ON CLINICAL CONDITION.   Gladstone Lighter M.D on 08/18/2017 at 11:15 AM  Between 7am to 6pm - Pager - [phone]  After 6pm go to [email or web address] - password [email or web address] [facility]  Office  [phone]  CC: Primary care physician; Rusty Aus, MD

## 2017-08-18 NOTE — Progress Notes (Signed)
Notified by PT that patient's left arm was weaker than earlier and is also tingling now. Upon assessment earlier, patient was able to move left arm and hold up for a couple seconds, and able to open and close his fingers well. Upon assessment now, patient unable to move arm and barely able to open and close his fingers. BP now 101/64. No other new symptoms. MD notified, in to see patient. Will continue to monitor. Ammie Dalton, RN

## 2017-08-18 NOTE — Progress Notes (Signed)
[facility]  ROUNDING NOTE   Subjective:  Renal function continues to improve. Creatinine down to 3.0. It appears that his left-sided weakness is slowly improving as well.    Objective:  Vital signs in last 24 hours:  Temp:  [97.5 F (36.4 C)-98.2 F (36.8 C)] 97.8 F (36.6 C) (08/04 0828) Pulse Rate:  [58-73] 58 (08/04 0828) Resp:  [16-20] 18 (08/04 0828) BP: (127-170)/(79-96) 170/85 (08/04 0828) SpO2:  [96 %-100 %] 96 % (08/04 0828)  Weight change:  Filed Weights   08/13/17 1345  Weight: 86.2 kg (190 lb)    Intake/Output: I/O last 3 completed shifts: In: 940 [P.O.:940] Out: 1130 [Urine:1130]   Intake/Output this shift:  No intake/output data recorded.  Physical Exam: General: No acute distress  Head: Normocephalic, atraumatic. Moist oral mucosal membranes  Eyes: Anicteric  Neck: Supple, trachea midline  Lungs:  Clear to auscultation, normal effort  Heart: S1S2 no rubs  Abdomen:  Soft, nontender, bowel sounds present  Extremities: Trace peripheral edema.  Neurologic: Left sided weakness noted, awake, alert  Skin: No lesions       Basic Metabolic Panel: Recent Labs  Lab 08/13/17 1344 08/14/17 0330 08/15/17 0410 08/16/17 0511 08/18/17 0451  NA 139 139 139 141 142  K 4.5 5.1 5.1 4.8 5.1  CL 112* 113* 113* 115* 116*  CO2 21* 22 22 21* 22  GLUCOSE 123* 103* 97 98 101*  BUN 48* 47* 44* 40* 35*  CREATININE 3.96* 3.72* 3.77* 3.29* 3.03*  CALCIUM 8.7* 8.5* 8.5* 8.3* 8.4*  MG 2.1  --   --   --   --     Liver Function Tests: Recent Labs  Lab 08/13/17 1344  AST 15  ALT 12  ALKPHOS 112  BILITOT 0.5  PROT 7.3  ALBUMIN 4.0   No results for input(s): LIPASE, AMYLASE in the last 168 hours. No results for input(s): AMMONIA in the last 168 hours.  CBC: Recent Labs  Lab 08/13/17 1344 08/15/17 0410  WBC 6.9 6.3  NEUTROABS 4.9  --   HGB 12.5* 11.2*  HCT 36.9* 32.7*  MCV 84.9 84.4  PLT 184 165    Cardiac Enzymes: Recent Labs   Lab 08/13/17 1344  TROPONINI <0.03    BNP: Invalid input(s): POCBNP  CBG: No results for input(s): GLUCAP in the last 168 hours.  Microbiology: No results found for this or any previous visit.  Coagulation Studies: No results for input(s): LABPROT, INR in the last 72 hours.  Urinalysis: No results for input(s): COLORURINE, LABSPEC, PHURINE, GLUCOSEU, HGBUR, BILIRUBINUR, KETONESUR, PROTEINUR, UROBILINOGEN, NITRITE, LEUKOCYTESUR in the last 72 hours.  Invalid input(s): APPERANCEUR    Imaging: No results found.   Medications:    . amoxicillin-clavulanate  500 mg Oral Q12H  . aspirin  81 mg Oral Daily  . atorvastatin  40 mg Oral q1800  . calcitRIOL  0.25 mcg Oral Q M,W,F  . citalopram  30 mg Oral Daily  . gabapentin  200 mg Oral BID  . heparin  5,000 Units Subcutaneous Q8H  . metoprolol tartrate  25 mg Oral BID  . pantoprazole  40 mg Oral QAC breakfast  . tamsulosin  0.4 mg Oral QPC breakfast  . umeclidinium-vilanterol  1 puff Inhalation Daily   acetaminophen **OR** acetaminophen, albuterol, albuterol, ALPRAZolam, alum & mag hydroxide-simeth, bisacodyl, diphenhydrAMINE, hydrALAZINE, ondansetron **OR** ondansetron (ZOFRAN) IV, polyvinyl alcohol, senna-docusate, traMADol, zolpidem  Assessment/ Plan:  82 y.o. 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  1.  Chronic kidney disease stage V.  Kidney function continues to improve.  Creatinine now down to 3.03.  As before no urgent indication for dialysis.  We follow him closely for advanced chronic kidney disease as an outpatient.  2.  Anemia chronic kidney disease.  At last check hemoglobin was 11.2.  No  indication for Procrit.  3.  Hypertension.  Blood pressure a bit high this a.m. but otherwise has been well controlled over the preceding 24 hours.  Continue metoprolol 25 mg p.o. twice daily.  4.  Secondary hyperparathyroidism.  Continue Calcitrol 0.25 mcg p.o. 3 times per week.   LOS: 4 Kage Willmann 8/4/201910:39 AM

## 2017-08-19 ENCOUNTER — Inpatient Hospital Stay (HOSPITAL_COMMUNITY): Payer: Medicare Other

## 2017-08-19 ENCOUNTER — Other Ambulatory Visit: Payer: Self-pay

## 2017-08-19 ENCOUNTER — Inpatient Hospital Stay (HOSPITAL_COMMUNITY)
Admission: RE | Admit: 2017-08-19 | Discharge: 2017-09-07 | DRG: 057 | Disposition: A | Discharge status: Home Health | Payer: Medicare Other | Source: Intra-hospital | Attending: Physical Medicine & Rehabilitation | Admitting: Physical Medicine & Rehabilitation

## 2017-08-19 ENCOUNTER — Encounter (HOSPITAL_COMMUNITY): Payer: Self-pay

## 2017-08-19 DIAGNOSIS — M1990 Unspecified osteoarthritis, unspecified site: Secondary | ICD-10-CM | POA: Diagnosis present

## 2017-08-19 DIAGNOSIS — I69392 Facial weakness following cerebral infarction: Secondary | ICD-10-CM

## 2017-08-19 DIAGNOSIS — K610 Anal abscess: Secondary | ICD-10-CM

## 2017-08-19 DIAGNOSIS — J449 Chronic obstructive pulmonary disease, unspecified: Secondary | ICD-10-CM | POA: Diagnosis present

## 2017-08-19 DIAGNOSIS — G609 Hereditary and idiopathic neuropathy, unspecified: Secondary | ICD-10-CM | POA: Diagnosis not present

## 2017-08-19 DIAGNOSIS — E785 Hyperlipidemia, unspecified: Secondary | ICD-10-CM | POA: Diagnosis present

## 2017-08-19 DIAGNOSIS — I69354 Hemiplegia and hemiparesis following cerebral infarction affecting left non-dominant side: Secondary | ICD-10-CM | POA: Diagnosis not present

## 2017-08-19 DIAGNOSIS — K5900 Constipation, unspecified: Secondary | ICD-10-CM | POA: Diagnosis not present

## 2017-08-19 DIAGNOSIS — I6389 Other cerebral infarction: Secondary | ICD-10-CM

## 2017-08-19 DIAGNOSIS — M159 Polyosteoarthritis, unspecified: Secondary | ICD-10-CM | POA: Diagnosis not present

## 2017-08-19 DIAGNOSIS — Z79899 Other long term (current) drug therapy: Secondary | ICD-10-CM

## 2017-08-19 DIAGNOSIS — J9811 Atelectasis: Secondary | ICD-10-CM | POA: Diagnosis present

## 2017-08-19 DIAGNOSIS — D62 Acute posthemorrhagic anemia: Secondary | ICD-10-CM | POA: Diagnosis present

## 2017-08-19 DIAGNOSIS — I725 Aneurysm of other precerebral arteries: Secondary | ICD-10-CM | POA: Diagnosis not present

## 2017-08-19 DIAGNOSIS — M24512 Contracture, left shoulder: Secondary | ICD-10-CM | POA: Diagnosis present

## 2017-08-19 DIAGNOSIS — H01003 Unspecified blepharitis right eye, unspecified eyelid: Secondary | ICD-10-CM | POA: Diagnosis not present

## 2017-08-19 DIAGNOSIS — I2510 Atherosclerotic heart disease of native coronary artery without angina pectoris: Secondary | ICD-10-CM | POA: Diagnosis not present

## 2017-08-19 DIAGNOSIS — I69322 Dysarthria following cerebral infarction: Secondary | ICD-10-CM

## 2017-08-19 DIAGNOSIS — R351 Nocturia: Secondary | ICD-10-CM | POA: Diagnosis not present

## 2017-08-19 DIAGNOSIS — Z87891 Personal history of nicotine dependence: Secondary | ICD-10-CM

## 2017-08-19 DIAGNOSIS — R0981 Nasal congestion: Secondary | ICD-10-CM | POA: Diagnosis present

## 2017-08-19 DIAGNOSIS — R062 Wheezing: Secondary | ICD-10-CM | POA: Diagnosis not present

## 2017-08-19 DIAGNOSIS — D638 Anemia in other chronic diseases classified elsewhere: Secondary | ICD-10-CM | POA: Diagnosis present

## 2017-08-19 DIAGNOSIS — N2581 Secondary hyperparathyroidism of renal origin: Secondary | ICD-10-CM | POA: Diagnosis present

## 2017-08-19 DIAGNOSIS — I671 Cerebral aneurysm, nonruptured: Secondary | ICD-10-CM | POA: Diagnosis present

## 2017-08-19 DIAGNOSIS — N183 Chronic kidney disease, stage 3 (moderate): Secondary | ICD-10-CM

## 2017-08-19 DIAGNOSIS — E669 Obesity, unspecified: Secondary | ICD-10-CM | POA: Diagnosis present

## 2017-08-19 DIAGNOSIS — M21372 Foot drop, left foot: Secondary | ICD-10-CM | POA: Diagnosis present

## 2017-08-19 DIAGNOSIS — K2970 Gastritis, unspecified, without bleeding: Secondary | ICD-10-CM

## 2017-08-19 DIAGNOSIS — R350 Frequency of micturition: Secondary | ICD-10-CM | POA: Diagnosis not present

## 2017-08-19 DIAGNOSIS — R011 Cardiac murmur, unspecified: Secondary | ICD-10-CM | POA: Diagnosis present

## 2017-08-19 DIAGNOSIS — N179 Acute kidney failure, unspecified: Secondary | ICD-10-CM | POA: Diagnosis present

## 2017-08-19 DIAGNOSIS — R29706 NIHSS score 6: Secondary | ICD-10-CM | POA: Diagnosis not present

## 2017-08-19 DIAGNOSIS — I10 Essential (primary) hypertension: Secondary | ICD-10-CM | POA: Diagnosis not present

## 2017-08-19 DIAGNOSIS — M5430 Sciatica, unspecified side: Secondary | ICD-10-CM | POA: Diagnosis not present

## 2017-08-19 DIAGNOSIS — K219 Gastro-esophageal reflux disease without esophagitis: Secondary | ICD-10-CM | POA: Diagnosis not present

## 2017-08-19 DIAGNOSIS — I83892 Varicose veins of left lower extremities with other complications: Secondary | ICD-10-CM | POA: Diagnosis present

## 2017-08-19 DIAGNOSIS — G5621 Lesion of ulnar nerve, right upper limb: Secondary | ICD-10-CM | POA: Diagnosis not present

## 2017-08-19 DIAGNOSIS — I69312 Visuospatial deficit and spatial neglect following cerebral infarction: Secondary | ICD-10-CM | POA: Diagnosis not present

## 2017-08-19 DIAGNOSIS — D649 Anemia, unspecified: Secondary | ICD-10-CM | POA: Diagnosis not present

## 2017-08-19 DIAGNOSIS — K2990 Gastroduodenitis, unspecified, without bleeding: Secondary | ICD-10-CM | POA: Diagnosis not present

## 2017-08-19 DIAGNOSIS — R195 Other fecal abnormalities: Secondary | ICD-10-CM | POA: Diagnosis not present

## 2017-08-19 DIAGNOSIS — M2550 Pain in unspecified joint: Secondary | ICD-10-CM | POA: Diagnosis present

## 2017-08-19 DIAGNOSIS — Z8719 Personal history of other diseases of the digestive system: Secondary | ICD-10-CM | POA: Diagnosis not present

## 2017-08-19 DIAGNOSIS — N401 Enlarged prostate with lower urinary tract symptoms: Secondary | ICD-10-CM | POA: Diagnosis present

## 2017-08-19 DIAGNOSIS — R234 Changes in skin texture: Secondary | ICD-10-CM | POA: Diagnosis present

## 2017-08-19 DIAGNOSIS — Z8249 Family history of ischemic heart disease and other diseases of the circulatory system: Secondary | ICD-10-CM

## 2017-08-19 DIAGNOSIS — Z888 Allergy status to other drugs, medicaments and biological substances status: Secondary | ICD-10-CM

## 2017-08-19 DIAGNOSIS — I350 Nonrheumatic aortic (valve) stenosis: Secondary | ICD-10-CM | POA: Diagnosis not present

## 2017-08-19 DIAGNOSIS — N184 Chronic kidney disease, stage 4 (severe): Secondary | ICD-10-CM | POA: Diagnosis present

## 2017-08-19 DIAGNOSIS — I69393 Ataxia following cerebral infarction: Secondary | ICD-10-CM

## 2017-08-19 DIAGNOSIS — Z7901 Long term (current) use of anticoagulants: Secondary | ICD-10-CM | POA: Diagnosis not present

## 2017-08-19 DIAGNOSIS — I129 Hypertensive chronic kidney disease with stage 1 through stage 4 chronic kidney disease, or unspecified chronic kidney disease: Secondary | ICD-10-CM | POA: Diagnosis present

## 2017-08-19 DIAGNOSIS — R32 Unspecified urinary incontinence: Secondary | ICD-10-CM | POA: Diagnosis not present

## 2017-08-19 DIAGNOSIS — I69319 Unspecified symptoms and signs involving cognitive functions following cerebral infarction: Secondary | ICD-10-CM

## 2017-08-19 DIAGNOSIS — I6312 Cerebral infarction due to embolism of basilar artery: Secondary | ICD-10-CM | POA: Diagnosis present

## 2017-08-19 DIAGNOSIS — I714 Abdominal aortic aneurysm, without rupture: Secondary | ICD-10-CM | POA: Diagnosis present

## 2017-08-19 DIAGNOSIS — Z79891 Long term (current) use of opiate analgesic: Secondary | ICD-10-CM

## 2017-08-19 DIAGNOSIS — H02103 Unspecified ectropion of right eye, unspecified eyelid: Secondary | ICD-10-CM | POA: Diagnosis present

## 2017-08-19 DIAGNOSIS — Z6830 Body mass index (BMI) 30.0-30.9, adult: Secondary | ICD-10-CM

## 2017-08-19 DIAGNOSIS — R509 Fever, unspecified: Secondary | ICD-10-CM

## 2017-08-19 DIAGNOSIS — N189 Chronic kidney disease, unspecified: Secondary | ICD-10-CM

## 2017-08-19 DIAGNOSIS — R29704 NIHSS score 4: Secondary | ICD-10-CM | POA: Diagnosis present

## 2017-08-19 DIAGNOSIS — R21 Rash and other nonspecific skin eruption: Secondary | ICD-10-CM | POA: Diagnosis present

## 2017-08-19 LAB — BASIC METABOLIC PANEL
Anion gap: 7 (ref 5–15)
BUN: 40 mg/dL — ABNORMAL HIGH (ref 8–23)
CHLORIDE: 113 mmol/L — AB (ref 98–111)
CO2: 20 mmol/L — AB (ref 22–32)
CREATININE: 3.22 mg/dL — AB (ref 0.61–1.24)
Calcium: 8.3 mg/dL — ABNORMAL LOW (ref 8.9–10.3)
GFR calc non Af Amer: 17 mL/min — ABNORMAL LOW (ref >60)
GFR, EST AFRICAN AMERICAN: 20 mL/min — AB (ref >60)
Glucose, Bld: 107 mg/dL — ABNORMAL HIGH (ref 70–99)
POTASSIUM: 4.8 mmol/L (ref 3.5–5.1)
SODIUM: 140 mmol/L (ref 135–145)

## 2017-08-19 LAB — CREATININE, SERUM
CREATININE: 3.43 mg/dL — AB (ref 0.61–1.24)
GFR calc non Af Amer: 16 mL/min — ABNORMAL LOW (ref >60)
GFR, EST AFRICAN AMERICAN: 18 mL/min — AB (ref >60)

## 2017-08-19 MED ORDER — TRAMADOL HCL 50 MG PO TABS
50.0000 mg | ORAL_TABLET | Freq: Four times a day (QID) | ORAL | Status: DC | PRN
  Administered 2017-08-26 – 2017-08-30 (×4): 50 mg via ORAL
  Filled 2017-08-19 (×3): qty 1

## 2017-08-19 MED ORDER — ALUM & MAG HYDROXIDE-SIMETH 200-200-20 MG/5ML PO SUSP
30.0000 mL | ORAL | Status: DC | PRN
  Administered 2017-08-31 – 2017-09-06 (×4): 30 mL via ORAL
  Filled 2017-08-19 (×4): qty 30

## 2017-08-19 MED ORDER — ATORVASTATIN CALCIUM 40 MG PO TABS: 40.0000 mg | ORAL_TABLET | Freq: Every day | ORAL | 2 refills | Status: DC

## 2017-08-19 MED ORDER — AMOXICILLIN-POT CLAVULANATE 500-125 MG PO TABS
500.0000 mg | ORAL_TABLET | Freq: Two times a day (BID) | ORAL | Status: AC
  Administered 2017-08-19 – 2017-08-26 (×15): 500 mg via ORAL
  Filled 2017-08-19 (×16): qty 1

## 2017-08-19 MED ORDER — PROCHLORPERAZINE 25 MG RE SUPP: 12.5000 mg | Freq: Four times a day (QID) | RECTAL | Status: DC | PRN

## 2017-08-19 MED ORDER — PRO-STAT SUGAR FREE PO LIQD
30.0000 mL | Freq: Two times a day (BID) | ORAL | Status: DC
  Administered 2017-08-19 – 2017-09-06 (×36): 30 mL via ORAL
  Filled 2017-08-19 (×38): qty 30

## 2017-08-19 MED ORDER — UMECLIDINIUM-VILANTEROL 62.5-25 MCG/INH IN AEPB
1.0000 | INHALATION_SPRAY | Freq: Every day | RESPIRATORY_TRACT | Status: DC
  Administered 2017-08-20 – 2017-09-07 (×18): 1 via RESPIRATORY_TRACT
  Filled 2017-08-19 (×4): qty 14

## 2017-08-19 MED ORDER — PANTOPRAZOLE SODIUM 40 MG PO TBEC
40.0000 mg | DELAYED_RELEASE_TABLET | Freq: Every day | ORAL | Status: DC
  Administered 2017-08-20 – 2017-09-06 (×18): 40 mg via ORAL
  Filled 2017-08-19 (×18): qty 1

## 2017-08-19 MED ORDER — TRAMADOL HCL 50 MG PO TABS: 50.0000 mg | ORAL_TABLET | Freq: Four times a day (QID) | ORAL | 0 refills | Status: DC | PRN

## 2017-08-19 MED ORDER — MUSCLE RUB 10-15 % EX CREA
TOPICAL_CREAM | CUTANEOUS | Status: DC | PRN
Start: 2017-08-19 — End: 2017-09-07
  Filled 2017-08-19: qty 85

## 2017-08-19 MED ORDER — ALBUTEROL SULFATE (2.5 MG/3ML) 0.083% IN NEBU: 3.0000 mL | INHALATION_SOLUTION | Freq: Four times a day (QID) | RESPIRATORY_TRACT | 12 refills | Status: DC | PRN

## 2017-08-19 MED ORDER — FLEET ENEMA 7-19 GM/118ML RE ENEM: 1.0000 | ENEMA | Freq: Once | RECTAL | Status: DC | PRN

## 2017-08-19 MED ORDER — RENA-VITE PO TABS
1.0000 | ORAL_TABLET | Freq: Every day | ORAL | Status: DC
  Administered 2017-08-19 – 2017-09-06 (×19): 1 via ORAL
  Filled 2017-08-19 (×19): qty 1

## 2017-08-19 MED ORDER — TROLAMINE SALICYLATE 10 % EX CREA
TOPICAL_CREAM | Freq: Two times a day (BID) | CUTANEOUS | Status: DC | PRN
  Filled 2017-08-19: qty 85

## 2017-08-19 MED ORDER — SENNOSIDES-DOCUSATE SODIUM 8.6-50 MG PO TABS: 1.0000 | ORAL_TABLET | Freq: Every evening | ORAL | Status: DC | PRN

## 2017-08-19 MED ORDER — CALCITRIOL 0.25 MCG PO CAPS: 0.2500 ug | ORAL_CAPSULE | ORAL | 1 refills | Status: AC

## 2017-08-19 MED ORDER — TAMSULOSIN HCL 0.4 MG PO CAPS
0.4000 mg | ORAL_CAPSULE | Freq: Every day | ORAL | Status: DC
  Administered 2017-08-20 – 2017-08-21 (×2): 0.4 mg via ORAL
  Filled 2017-08-19 (×3): qty 1

## 2017-08-19 MED ORDER — TRAMADOL HCL 50 MG PO TABS: 50.0000 mg | ORAL_TABLET | Freq: Three times a day (TID) | ORAL | Status: DC | PRN

## 2017-08-19 MED ORDER — TRAZODONE HCL 50 MG PO TABS
25.0000 mg | ORAL_TABLET | Freq: Every evening | ORAL | Status: DC | PRN
  Administered 2017-08-27 – 2017-09-05 (×4): 50 mg via ORAL
  Filled 2017-08-19 (×4): qty 1

## 2017-08-19 MED ORDER — SENNOSIDES-DOCUSATE SODIUM 8.6-50 MG PO TABS
2.0000 | ORAL_TABLET | Freq: Every day | ORAL | Status: DC
  Administered 2017-08-19 – 2017-09-05 (×18): 2 via ORAL
  Filled 2017-08-19 (×19): qty 2

## 2017-08-19 MED ORDER — ALPRAZOLAM 0.5 MG PO TABS: 0.5000 mg | ORAL_TABLET | Freq: Two times a day (BID) | ORAL | 0 refills | Status: DC | PRN

## 2017-08-19 MED ORDER — ASPIRIN 81 MG PO CHEW: 81.0000 mg | CHEWABLE_TABLET | Freq: Every day | ORAL | 2 refills | Status: DC

## 2017-08-19 MED ORDER — POLYETHYLENE GLYCOL 3350 17 G PO PACK: 17.0000 g | PACK | Freq: Every day | ORAL | Status: DC | PRN

## 2017-08-19 MED ORDER — POLYVINYL ALCOHOL 1.4 % OP SOLN: 1.0000 [drp] | Freq: Four times a day (QID) | OPHTHALMIC | Status: DC | PRN

## 2017-08-19 MED ORDER — ALBUTEROL SULFATE (2.5 MG/3ML) 0.083% IN NEBU
3.0000 mL | INHALATION_SOLUTION | RESPIRATORY_TRACT | Status: DC | PRN
  Administered 2017-08-19 – 2017-09-02 (×12): 3 mL via RESPIRATORY_TRACT
  Filled 2017-08-19 (×14): qty 3

## 2017-08-19 MED ORDER — AMOXICILLIN-POT CLAVULANATE 500-125 MG PO TABS: 500.0000 mg | ORAL_TABLET | Freq: Two times a day (BID) | ORAL | 0 refills | Status: DC

## 2017-08-19 MED ORDER — ALBUTEROL SULFATE (2.5 MG/3ML) 0.083% IN NEBU: 3.0000 mL | INHALATION_SOLUTION | Freq: Four times a day (QID) | RESPIRATORY_TRACT | Status: DC | PRN

## 2017-08-19 MED ORDER — BISACODYL 10 MG RE SUPP: 10.0000 mg | Freq: Every day | RECTAL | Status: DC | PRN

## 2017-08-19 MED ORDER — CITALOPRAM HYDROBROMIDE 20 MG PO TABS
30.0000 mg | ORAL_TABLET | Freq: Every day | ORAL | Status: DC
  Administered 2017-08-20 – 2017-09-07 (×19): 30 mg via ORAL
  Filled 2017-08-19 (×19): qty 1

## 2017-08-19 MED ORDER — CALCITRIOL 0.25 MCG PO CAPS
0.2500 ug | ORAL_CAPSULE | ORAL | Status: DC
  Administered 2017-08-21 – 2017-09-06 (×7): 0.25 ug via ORAL
  Filled 2017-08-19 (×7): qty 1

## 2017-08-19 MED ORDER — GUAIFENESIN-DM 100-10 MG/5ML PO SYRP
5.0000 mL | ORAL_SOLUTION | Freq: Four times a day (QID) | ORAL | Status: DC | PRN
  Filled 2017-08-19: qty 10

## 2017-08-19 MED ORDER — GABAPENTIN 100 MG PO CAPS
200.0000 mg | ORAL_CAPSULE | Freq: Two times a day (BID) | ORAL | Status: DC
  Administered 2017-08-19 – 2017-08-21 (×4): 200 mg via ORAL
  Filled 2017-08-19 (×4): qty 2

## 2017-08-19 MED ORDER — PROCHLORPERAZINE EDISYLATE 10 MG/2ML IJ SOLN: 5.0000 mg | Freq: Four times a day (QID) | INTRAMUSCULAR | Status: DC | PRN

## 2017-08-19 MED ORDER — ENOXAPARIN SODIUM 30 MG/0.3ML ~~LOC~~ SOLN
30.0000 mg | SUBCUTANEOUS | Status: DC
  Administered 2017-08-19 – 2017-09-04 (×17): 30 mg via SUBCUTANEOUS
  Filled 2017-08-19 (×17): qty 0.3

## 2017-08-19 MED ORDER — GABAPENTIN 100 MG PO CAPS: 200.0000 mg | ORAL_CAPSULE | Freq: Two times a day (BID) | ORAL | 0 refills | Status: DC

## 2017-08-19 MED ORDER — ACETAMINOPHEN 325 MG PO TABS: 325.0000 mg | ORAL_TABLET | ORAL | Status: DC | PRN

## 2017-08-19 MED ORDER — PROCHLORPERAZINE MALEATE 5 MG PO TABS: 5.0000 mg | ORAL_TABLET | Freq: Four times a day (QID) | ORAL | Status: DC | PRN

## 2017-08-19 MED ORDER — ATORVASTATIN CALCIUM 40 MG PO TABS
40.0000 mg | ORAL_TABLET | Freq: Every day | ORAL | Status: DC
  Administered 2017-08-19 – 2017-09-06 (×19): 40 mg via ORAL
  Filled 2017-08-19 (×20): qty 1

## 2017-08-19 MED ORDER — ALUM & MAG HYDROXIDE-SIMETH 200-200-20 MG/5ML PO SUSP: 30.0000 mL | Freq: Four times a day (QID) | ORAL | Status: DC | PRN

## 2017-08-19 MED ORDER — METOPROLOL TARTRATE 25 MG PO TABS
25.0000 mg | ORAL_TABLET | Freq: Two times a day (BID) | ORAL | Status: DC
  Administered 2017-08-19 – 2017-08-21 (×4): 25 mg via ORAL
  Filled 2017-08-19 (×4): qty 1

## 2017-08-19 MED ORDER — DIPHENHYDRAMINE HCL 12.5 MG/5ML PO ELIX
12.5000 mg | ORAL_SOLUTION | Freq: Four times a day (QID) | ORAL | Status: DC | PRN
  Administered 2017-08-22: 25 mg via ORAL
  Filled 2017-08-19: qty 10

## 2017-08-19 MED ORDER — ASPIRIN EC 325 MG PO TBEC
325.0000 mg | DELAYED_RELEASE_TABLET | Freq: Every day | ORAL | Status: DC
  Administered 2017-08-20 – 2017-08-21 (×2): 325 mg via ORAL
  Filled 2017-08-19 (×2): qty 1

## 2017-08-19 NOTE — Progress Notes (Addendum)
Inpatient Rehabilitation  Met with patient, spouse, daughter, and granddaughter at bedside to discuss team's recommendation for IP Rehab.  Shared booklets, insurance verification letter, and answered questions.  Plan to follow up with insurance today in hopes of an admission to [facility].  Plan to update team when I know.  Call if questions.    Update: [employer] reports that their portal is currently down.  They requested I follow up in 30-60 minutes for insurance decision.  Notified case managers Hassan Rowan and [place].   Carmelia Roller., CCC/SLP Admission Coordinator  [place]  Cell [phone]

## 2017-08-19 NOTE — Progress Notes (Signed)
[facility]  ROUNDING NOTE   Subjective:   Family at bedside.   Patient doing exercises.   Objective:  Vital signs in last 24 hours:  Temp:  [97.9 F (36.6 C)-98.6 F (37 C)] 97.9 F (36.6 C) (08/05 0800) Pulse Rate:  [60-79] 78 (08/05 0800) Resp:  [15-20] 18 (08/05 0800) BP: (101-142)/(64-96) 135/79 (08/05 1035) SpO2:  [93 %-98 %] 98 % (08/05 0800)  Weight change:  Filed Weights   08/13/17 1345  Weight: 86.2 kg (190 lb)    Intake/Output: I/O last 3 completed shifts: In: 700 [P.O.:700] Out: 400 [Urine:400]   Intake/Output this shift:  Total I/O In: 480 [P.O.:480] Out: -   Physical Exam: General: No acute distress  Head: Normocephalic, atraumatic. Moist oral mucosal membranes  Eyes: Anicteric  Neck: Supple, trachea midline  Lungs:  Clear to auscultation, normal effort  Heart: S1S2 no rubs  Abdomen:  Soft, nontender, bowel sounds present  Extremities: Trace peripheral edema.  Neurologic: Left sided weakness   Skin: No lesions       Basic Metabolic Panel: Recent Labs  Lab 08/13/17 1344 08/14/17 0330 08/15/17 0410 08/16/17 0511 08/18/17 0451 08/19/17 0519  NA 139 139 139 141 142 140  K 4.5 5.1 5.1 4.8 5.1 4.8  CL 112* 113* 113* 115* 116* 113*  CO2 21* 22 22 21* 22 20*  GLUCOSE 123* 103* 97 98 101* 107*  BUN 48* 47* 44* 40* 35* 40*  CREATININE 3.96* 3.72* 3.77* 3.29* 3.03* 3.22*  CALCIUM 8.7* 8.5* 8.5* 8.3* 8.4* 8.3*  MG 2.1  --   --   --   --   --     Liver Function Tests: Recent Labs  Lab 08/13/17 1344  AST 15  ALT 12  ALKPHOS 112  BILITOT 0.5  PROT 7.3  ALBUMIN 4.0   No results for input(s): LIPASE, AMYLASE in the last 168 hours. No results for input(s): AMMONIA in the last 168 hours.  CBC: Recent Labs  Lab 08/13/17 1344 08/15/17 0410  WBC 6.9 6.3  NEUTROABS 4.9  --   HGB 12.5* 11.2*  HCT 36.9* 32.7*  MCV 84.9 84.4  PLT 184 165    Cardiac Enzymes: Recent Labs  Lab 08/13/17 1344  TROPONINI <0.03     BNP: Invalid input(s): POCBNP  CBG: No results for input(s): GLUCAP in the last 168 hours.  Microbiology: No results found for this or any previous visit.  Coagulation Studies: No results for input(s): LABPROT, INR in the last 72 hours.  Urinalysis: No results for input(s): COLORURINE, LABSPEC, PHURINE, GLUCOSEU, HGBUR, BILIRUBINUR, KETONESUR, PROTEINUR, UROBILINOGEN, NITRITE, LEUKOCYTESUR in the last 72 hours.  Invalid input(s): APPERANCEUR    Imaging: No results found.   Medications:    . amLODipine  5 mg Oral Daily  . amoxicillin-clavulanate  500 mg Oral Q12H  . aspirin  81 mg Oral Daily  . atorvastatin  40 mg Oral q1800  . calcitRIOL  0.25 mcg Oral Q M,W,F  . citalopram  30 mg Oral Daily  . gabapentin  200 mg Oral BID  . heparin  5,000 Units Subcutaneous Q8H  . metoprolol tartrate  25 mg Oral BID  . pantoprazole  40 mg Oral QAC breakfast  . tamsulosin  0.4 mg Oral QPC breakfast  . umeclidinium-vilanterol  1 puff Inhalation Daily   acetaminophen **OR** acetaminophen, albuterol, albuterol, ALPRAZolam, alum & mag hydroxide-simeth, bisacodyl, diphenhydrAMINE, hydrALAZINE, ondansetron **OR** ondansetron (ZOFRAN) IV, polyvinyl alcohol, senna-docusate, traMADol, zolpidem  Assessment/ Plan:   Mr.  [name] is a 82 y.o. year old white male with hypertension, osteoarthritis, gastritis, gastrointestinal bleed secondary to NSAIDs, chronic obstructive pulmonary disease, admitted to [facility] on 08/13/2017 for acute CVA.   1.  Chronic kidney disease stage V with proteinuria. Baseline creatinine 3.65, GFR of 15 on 06/27/17. Follows with Dr. Holley Raring Creatinine and electrolytes are at baseline. No acute indication for dialysis.  Chronic kidney disease secondary to hypertension, diabetes, NSAIDs and obstructive uropathy Holding ARB due to advanced renal disease. Allergy to ACE-I.   2.  Anemia chronic kidney disease: hemoglobin 11.2 Normocytic. No indication for ESA  3.   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 Blood pressure at goal.  Home regimen of furosemide, tamusosin and metoprolol.  - Continue tamsulosin, metoprolol and new agent on this admission: amlodipine  4.  Secondary hyperparathyroidism: PTH 243 on 06/27/17. Calcium and phosphorus at goal - Continue home regimen of calcitrol 0.25 mcg p.o. 3 times per week.   LOS: 5 [name] 8/5/201911:07 AM

## 2017-08-19 NOTE — PMR Pre-admission (Signed)
[facility] PMR Admission Coordinator Pre-Admission Assessment  Patient: [name] is an 82 y.o., male MRN: [id number] DOB: 1935/01/24 Height: 6' (182.9 cm) Weight: 86.2 kg (190 lb)  Insurance Information HMO: X    PPO:      PCP:      IPA:      80/20:      OTHER: PRIMARY: U[employer] Medicare       Policy#: [id number]      Subscriber: Self CM Name: Vevelyn Royals        Phone#: [phone]        Fax#: [phone]           Pre-Cert#: [id number]     Employer: Retired  Benefits:  Phone #: [phone]     Name: Verified online at [email or web address]  Eff. 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 [name] is a 82 y.o. year old white male with hypertension, osteoarthritis, gastritis, gastrointestinal bleed secondary to NSAIDs, chronic obstructive pulmonary disease, admitted to [facility] on 08/13/2017 for acute CVA.  Imaging revealed an acute right medullary infarct.  CKD stage IV-creatinine is stable and will require close follow up.  Also monitoring his small peri-anal abscess with start of Augmentin.  Stroke work up complete and therapies recommending CIR for post acute therapy.  Patient admitted to IP Rehab 08/19/17.    Patient's medical record from [facility] has been reviewed by the rehabilitation admission coordinator and physician.  NIH Stroke scale: 4  Past Medical History  Past Medical  History:  Diagnosis Date  . Aortic stenosis   . CAD (coronary artery disease)   . CKD (chronic kidney disease) stage 4, GFR 15-29 ml/min (HCC)   . Hereditary and idiopathic peripheral neuropathy   . HTN (hypertension)   . Pulmonary nodules    Family History   family history includes Heart attack in his father; Hypertension in his mother.  Prior Rehab/Hospitalizations Has the patient had major surgery during 100 days prior to admission? No   Current Medications Refer to [facility] MAR  Patients Current Diet:  Regular textures and thin liquids   Precautions / Restrictions Precautions Precautions: Fall Restrictions Weight Bearing Restrictions: No   Has the patient had 2 or more falls or a fall with injury in the past year?Yes  Prior Activity Level Community (5-7x/wk): Prior to admission patient was active, and fully independent.  Driving and managing medications and home.   Prior Functional Level Self Care: Did the patient need help bathing, dressing, using the toilet or eating? Independent  Indoor Mobility: Did the patient need assistance with walking from room to room (with or without device)? Independent  Stairs: Did  the patient need assistance with internal or external stairs (with or without device)? Independent  Functional Cognition: Did the patient need help planning regular tasks such as shopping or remembering to take medications? Independent  Home Assistive Devices / Equipment Home Assistive Devices/Equipment: Eyeglasses, Dentures (specify type) Home Equipment: Walker - 2 wheels, Walker - 4 wheels, Cane - quad, Bedside commode  Prior Device Use: Indicate devices/aids used by the patient prior to current illness, exacerbation or injury? None of the above   Prior Functional Level Current Functional Level  Bed Mobility  Independent  Min assist   Transfers  Independent  Mod assist   Mobility - Walk/Wheelchair  Independent  Mod assist   Upper Body Dressing   Independent  Mod assist   Lower Body Dressing  Independent  Max assist   Grooming  Independent   Supervision, set-up at bed level    Eating/Drinking  Independent   Supervision, set-up at bed level    Toilet Transfer  Independent   TBA   Bladder Continence   Some incontinence with use of Depends   Incontinent    Bowel Management  Continent   Continent with bed pan   Stair Climbing  Independent    TBA   Communication  Independent   Mod I   Memory  Independent   Independent    Cooking/Meal Prep  Independent       Housework  Independent     Money Management  Independent     Driving  Independent       Special needs/care consideration BiPAP/CPAP: No CPM: No Continuous Drip IV: No Dialysis: No, renal function monitored closely         Life Vest: No Oxygen: No Special Bed: No Trach Size: No Wound Vac (area): No       Skin: Bruising to bilateral arms; Acute on chronic draining abscess to left buttocks with foam dressing and currently on antibiotics                            Bowel mgmt: Continent, last BM 08/17/17 Bladder mgmt: Incontinent now and wore depends prior to admission due to urgency and frequency  Diabetic mgmt: No  Previous Home Environment Living Arrangements: Spouse/significant other  Lives With: Spouse Available Help at Discharge: Family(wife limited physically and family can assist intermittently) Type of Home: House Home Layout: One level Home Access: Stairs to enter Entrance Stairs-Rails: Left Entrance Stairs-Number of Steps: 2 Bathroom Shower/Tub: Tub/shower unit, Door [employer] Toilet: Handicapped height Bathroom Accessibility: Yes How Accessible: Accessible via walker [place]: No  Discharge Living Setting Plans for Discharge Living Setting: Patient's home, Lives with (comment)(Spouse) Type of Home at Discharge: House Discharge Home Layout: One level Discharge Home Access: Stairs to enter Entrance Stairs-Rails:  Left Entrance Stairs-Number of Steps: 2 Discharge Bathroom Shower/Tub: Tub/shower unit, Door Discharge Bathroom Toilet: Handicapped height Discharge Bathroom Accessibility: Yes How Accessible: Accessible via walker Does the patient have any problems obtaining your medications?: Yes (Describe)(Spouse reports that sometimes it is difficult to afford meds)  Social/Family/Support Systems Patient Roles: Spouse, Parent, Other (Comment)(Grandparent ) Contact Information: Spouse or daughter Almyra Free  Anticipated Caregiver: Spouse: Sueprivison, Granddaughter Alicia intermittent physical assist  Anticipated Ambulance person Information: see above  Ability/Limitations of Caregiver: Spouse can only provide Supervision assist  Caregiver Availability: 24/7(Supervision; intermittent physical assist ) Discharge Plan Discussed with Primary Caregiver: Yes Is Caregiver In Agreement with Plan?: Yes Does Caregiver/Family have Issues with Lodging/Transportation  while Pt is in Rehab?: No  Goals/Additional Needs Patient/Family Goal for Rehab: PT: Supervision; OT: Supervision-Min A Expected length of stay: 16-18 days  Cultural Considerations: None Dietary Needs: Regular textures and thin liquids  Equipment Needs: TBD Special Service Needs: Wife aware of valet parking and wheelchair service to room q Pt/Family Agrees to Admission and willing to participate: Yes Program Orientation Provided & Reviewed with Pt/Caregiver Including Roles  & Responsibilities: Yes Additional Information Needs: Needs education and training/family education as they are nervous  Information Needs to be Provided By: Team FYI  Barriers to Discharge: Decreased caregiver support, Wound Care  Patient Condition: I have reviewed medical records from [facility]  spoken with CM, and patient, spouse, daughter, and granddaughter. I met with patient at the bedside and discussed via phone for inpatient rehabilitation assessment.  Patient will benefit from  ongoing PT and OT, can actively participate in 3 hours of therapy a day 5 days of the week, and can make measurable gains during the admission.  Patient will also benefit from the coordinated team approach during an Inpatient Acute Rehabilitation admission.  The patient will receive intensive therapy as well as Rehabilitation physician, nursing, social worker, and care management interventions.  Due to bowel management, bladder management, safety, skin/wound care, disease management, medical administration, pain management, and patient education the patient requires 24 hour a day rehabilitation nursing.  The patient is currently Mod A +2 transfers and basic ADLs.  Discharge setting and therapy post discharge at home with home health is anticipated.  Patient has agreed to participate in the Acute Inpatient Rehabilitation Program and will admit today 08/19/17.     Preadmission Screen Completed By:  Gunnar Fusi, 08/19/2017 11:21 AM ______________________________________________________________________   Discussed status with Dr. Posey Pronto on 08/19/17  at 1209 and received telephone approval for admission today.  Admission Coordinator:  Gunnar Fusi, time 1209/Date 08/19/17   Assessment/Plan: Diagnosis: Right medullary CVA  1. Does the need for close, 24 hr/day  Medical supervision in concert with the patient's rehab needs make it unreasonable for this patient to be served in a less intensive setting? Yes  2. Co-Morbidities requiring supervision/potential complications: hypertension, osteoarthritis, gastritis, gastrointestinal bleed secondary to NSAIDs, chronic obstructive pulmonary disease, AKI on CKD 3. Due to bladder management, bowel management, safety, disease management, medication administration and patient education, does the patient require 24 hr/day rehab nursing? Yes 4. Does the patient require coordinated care of a physician, rehab nurse, PT (1-2 hrs/day, 5 days/week) and OT (1-2 hrs/day, 5 days/week)  to address physical and functional deficits in the context of the above medical diagnosis(es)? Yes Addressing deficits in the following areas: balance, endurance, locomotion, strength, transferring, bathing, dressing, toileting and psychosocial support 5. Can the patient actively participate in an intensive therapy program of at least 3 hrs of therapy 5 days a week? Yes 6. The potential for patient to make measurable gains while on inpatient rehab is excellent 7. Anticipated functional outcomes upon discharge from inpatients are: min assist PT, min assist OT, n/a SLP 8. Estimated rehab length of stay to reach the above functional goals is: 8-13 days. 9. Does the patient have adequate social supports to accommodate these discharge functional goals? Potentially 10. Anticipated D/C setting: Home 11. Anticipated post D/C treatments: HH therapy and Home excercise program 12. Overall Rehab/Functional Prognosis: good    RECOMMENDATIONS: This patient's condition is appropriate for continued rehabilitative care in the following setting: CIR Patient has agreed to participate in recommended program. Yes Note that insurance  prior authorization may be required for reimbursement for recommended care.  Delice Lesch, MD, ABPMR Lenna Sciara Gertie Fey 08/19/2017

## 2017-08-19 NOTE — Progress Notes (Signed)
[facility] PMR Admission Coordinator Pre-Admission Assessment  Patient: Cole Cole is an 82 y.o., male MRN: [id number] DOB: [birth year] Height: 6' (182.9 cm) Weight: 86.2 kg (190 lb)  Insurance Information HMO: X    PPO:      PCP:      IPA:      80/20:      OTHER: PRIMARY: U[employer] Medicare       Policy#: [id number]      Subscriber: Self CM Name: Vevelyn Royals        Phone#: [phone]        Fax#: [phone]           Pre-Cert#: [id number]     Employer: Retired  Benefits:  Phone #: [phone]     Name: Verified online at [email or web address]   Eff. 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  Imaging revealed an acute right medullary infarct.  CKD stage IV-creatinine is stable and will require close follow up.  Also monitoring his small peri-anal abscess with start of Augmentin.  Stroke work up complete and therapies recommending CIR for post acute therapy.  Patient admitted to IP Rehab 08/19/17.    Patient's medical record from [facility] has been reviewed by the rehabilitation admission coordinator and physician.  NIH Stroke scale: 4  Past Medical History        Past Medical History:  Diagnosis Date  . Aortic stenosis   . CAD (coronary artery disease)   . CKD (chronic kidney disease) stage 4, GFR 15-29 ml/min (HCC)   . Hereditary and idiopathic peripheral neuropathy   . HTN (hypertension)   . Pulmonary nodules    Family History   family history includes Heart attack in his father; Hypertension in his mother.  Prior Rehab/Hospitalizations Has the patient had major surgery during 100 days prior to admission? No              Current Medications Refer to [facility] MAR  Patients Current Diet:  Regular textures and thin liquids   Precautions / Restrictions Precautions Precautions: Fall Restrictions Weight Bearing Restrictions: No   Has the patient had 2 or more falls or a fall with injury in the past year?Yes  Prior Activity Level Community (5-7x/wk): Prior to admission patient was active, and fully independent.  Driving and managing medications and home.   Prior Functional Level Self Care: Did the patient need help bathing, dressing, using the toilet or eating? Independent  Indoor Mobility: Did the  patient need assistance with walking from room to room (with or without device)? Independent  Stairs: Did the patient need assistance with internal or external stairs (with or without device)? Independent  Functional Cognition: Did the patient need help planning regular tasks such as shopping or remembering to take medications? Independent  Home Assistive Devices / Equipment Home Assistive Devices/Equipment: Eyeglasses, Dentures (specify type) Home Equipment: Walker - 2 wheels, Walker - 4 wheels, Cane - quad, Bedside commode  Prior Device Use: Indicate devices/aids used by the patient prior to current illness, exacerbation or injury? None of the above   Prior Functional Level Current Functional Level  Bed Mobility  Independent  Min assist   Transfers  Independent  Mod assist   Mobility -  Walk/Wheelchair  Independent  Mod assist   Upper Body Dressing  Independent  Mod assist   Lower Body Dressing  Independent  Max assist   Grooming  Independent   Supervision, set-up at bed level    Eating/Drinking  Independent   Supervision, set-up at bed level    Toilet Transfer  Independent   TBA   Bladder Continence   Some incontinence with use of Depends   Incontinent    Bowel Management  Continent   Continent with bed pan   Stair Climbing  Independent    TBA   Communication  Independent   Mod I   Memory  Independent   Independent    Cooking/Meal Prep  Independent       Housework  Independent     Money Management  Independent     Driving  Independent       Special needs/care consideration BiPAP/CPAP: No CPM: No Continuous Drip IV: No Dialysis: No, renal function monitored closely         Life Vest: No Oxygen: No Special Bed: No Trach Size: No Wound Vac (area): No       Skin: Bruising to bilateral arms; Acute on chronic draining abscess to left buttocks with foam dressing and currently on antibiotics                            Bowel mgmt: Continent, last BM 08/17/17 Bladder mgmt: Incontinent now and wore depends prior to admission due to urgency and frequency  Diabetic mgmt: No  Previous Home Environment Living Arrangements: Spouse/significant other  Lives With: Spouse Available Help at Discharge: Family(wife limited physically and family can assist intermittently) Type of Home: House Home Layout: One level Home Access: Stairs to enter Entrance Stairs-Rails: Left Entrance Stairs-Number of Steps: 2 Bathroom Shower/Tub: Tub/shower unit, Door [employer] Toilet: Handicapped height Bathroom Accessibility: Yes How Accessible: Accessible via walker [place]: No  Discharge Living Setting Plans for Discharge Living Setting: Patient's home, Lives with (comment)(Spouse) Type of Home  at Discharge: House Discharge Home Layout: One level Discharge Home Access: Stairs to enter Entrance Stairs-Rails: Left Entrance Stairs-Number of Steps: 2 Discharge Bathroom Shower/Tub: Tub/shower unit, Door Discharge Bathroom Toilet: Handicapped height Discharge Bathroom Accessibility: Yes How Accessible: Accessible via walker Does the patient have any problems obtaining your medications?: Yes (Describe)(Spouse reports that sometimes it is difficult to afford meds)  Social/Family/Support Systems Patient Roles: Spouse, Parent, Other (Comment)(Grandparent ) Contact Information: Spouse or daughter Cole Cole  Anticipated Caregiver: Spouse: Cole, Granddaughter Cole intermittent physical assist  Anticipated Ambulance person Information: see above  Ability/Limitations of Caregiver: Spouse can only provide Supervision assist  Caregiver Availability: 24/7(Supervision; intermittent physical assist ) Discharge Plan Discussed  with Primary Caregiver: Yes Is Caregiver In Agreement with Plan?: Yes Does Caregiver/Family have Issues with Lodging/Transportation while Pt is in Rehab?: No  Goals/Additional Needs Patient/Family Goal for Rehab: PT: Supervision; OT: Supervision-Min A Expected length of stay: 16-18 days  Cultural Considerations: None Dietary Needs: Regular textures and thin liquids  Equipment Needs: TBD Special Service Needs: Wife aware of valet parking and wheelchair service to room q Pt/Family Agrees to Admission and willing to participate: Yes Program Orientation Provided & Reviewed with Pt/Caregiver Including Roles  & Responsibilities: Yes Additional Information Needs: Needs education and training/family education as they are nervous  Information Needs to be Provided By: Team FYI  Barriers to Discharge: Decreased caregiver support, Wound Care  Patient Condition: I have reviewed medical records from [facility]  spoken with CM, and patient, spouse, daughter, and granddaughter. I  met with patient at the bedside and discussed via phone for inpatient rehabilitation assessment.  Patient will benefit from ongoing PT and OT, can actively participate in 3 hours of therapy a day 5 days of the week, and can make measurable gains during the admission.  Patient will also benefit from the coordinated team approach during an Inpatient Acute Rehabilitation admission.  The patient will receive intensive therapy as well as Rehabilitation physician, nursing, social worker, and care management interventions.  Due to bowel management, bladder management, safety, skin/wound care, disease management, medical administration, pain management, and patient education the patient requires 24 hour a day rehabilitation nursing.  The patient is currently Mod A +2 transfers and basic ADLs.  Discharge setting and therapy post discharge at home with home health is anticipated.  Patient has agreed to participate in the Acute Inpatient Rehabilitation Program and will admit today 08/19/17.     Preadmission Screen Completed By:  Gunnar Fusi, 08/19/2017 11:21 AM ______________________________________________________________________   Discussed status with Dr. Posey Pronto on 08/19/17  at 1209 and received telephone approval for admission today.  Admission Coordinator:  Gunnar Fusi, time 1209/Date 08/19/17   Assessment/Plan: Diagnosis: Right medullary CVA  1. Does the need for close, 24 hr/day  Medical supervision in concert with the patient's rehab needs make it unreasonable for this patient to be served in a less intensive setting? Yes  2. Co-Morbidities requiring supervision/potential complications: hypertension, osteoarthritis, gastritis, gastrointestinal bleed secondary to NSAIDs, chronic obstructive pulmonary disease, AKI on CKD 3. Due to bladder management, bowel management, safety, disease management, medication administration and patient education, does the patient require 24 hr/day rehab nursing? Yes 4. Does the  patient require coordinated care of a physician, rehab nurse, PT (1-2 hrs/day, 5 days/week) and OT (1-2 hrs/day, 5 days/week) to address physical and functional deficits in the context of the above medical diagnosis(es)? Yes Addressing deficits in the following areas: balance, endurance, locomotion, strength, transferring, bathing, dressing, toileting and psychosocial support 5. Can the patient actively participate in an intensive therapy program of at least 3 hrs of therapy 5 days a week? Yes 6. The potential for patient to make measurable gains while on inpatient rehab is excellent 7. Anticipated functional outcomes upon discharge from inpatients are: min assist PT, min assist OT, n/a SLP 8. Estimated rehab length of stay to reach the above functional goals is: 8-13 days. 9. Does the patient have adequate social supports to accommodate these discharge functional goals? Potentially 10. Anticipated D/C setting: Home 11. Anticipated post D/C treatments: HH therapy and Home excercise program 12. Overall Rehab/Functional Prognosis: good    RECOMMENDATIONS: This patient's condition is appropriate for continued rehabilitative care  in the following setting: CIR Patient has agreed to participate in recommended program. Yes Note that insurance prior authorization may be required for reimbursement for recommended care.  Delice Lesch, MD, ABPMR Gunnar Fusi 08/19/2017          Revision History

## 2017-08-19 NOTE — Progress Notes (Signed)
Inpatient Rehabilitation  I have received insurance approval for an IP Rehab admission.  Patient and family have been notified and are in agreement with plan.  Patient will admit to 4W20, admitting MD is Dr. Delice Lesch, and patient needs to arrive no later than 5pm.  Notified case manager, Bonnita Nasuti.  Call if questions.   Carmelia Roller., CCC/SLP Admission Coordinator  [place]  Cell [phone]

## 2017-08-19 NOTE — Discharge Summary (Signed)
[place] [facility] [place] NAME: [name]    MR#:  [id number]  DATE OF BIRTH:  1935/03/16  DATE OF ADMISSION:  08/13/2017   ADMITTING PHYSICIAN: Demetrios Loll, MD  DATE OF DISCHARGE: 08/19/17  PRIMARY CARE PHYSICIAN: Rusty Aus, MD   ADMISSION DIAGNOSIS:   Dehydration [E86.0] Weakness [R53.1]  DISCHARGE DIAGNOSIS:   Active Problems:   Renal failure (ARF), acute on chronic (HCC)   CVA (cerebral vascular accident) ([place])   Acute brainstem infarction ([place])   Generalized OA   Gastritis and gastroduodenitis   History of GI bleed   Chronic obstructive pulmonary disease (HCC)   AKI (acute kidney injury) ([place])   Stage 3 chronic kidney disease ([place])   Perianal abscess   SECONDARY DIAGNOSIS:   Past Medical History:  Diagnosis Date  . Aortic stenosis   . CAD (coronary artery disease)   . CKD (chronic kidney disease) stage 4, GFR 15-29 ml/min (HCC)   . Hereditary and idiopathic peripheral neuropathy   . HTN (hypertension)   . Pulmonary nodules     HOSPITAL COURSE:    82 y/o Male with PMH of CKD stage 4, Hy[pertension, CAD, aortic stenosis, peripheral neuropathy presents from home connected dizziness.  1.  Acute stroke-patient presented with dizziness, then developed left upper and lower leg weakness.   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  Interventional Radiology was contacted by neurology-did not recommend any procedures at this time. - carotid Dopplers with no hemodynamically significant stenosis.  Echocardiogram with no cardiac source of emboli, EF of 57% and diastolic dysfunction noted -Neurology consult is appreciated -on aspirin and statin. -PT/OT and speech consults- will need inpatient rehab at discharge  2.  CKD stage IV-creatinine is stable.   -  Appreciate nephrology consult - Continue outpatient follow-up.   Calcitriol thrice weekly  3.  Hypertension- on low-dose metoprolol, the pressure is stable.  If pressure goes low, he is very symptomatic with left arm weakness  4.  GERD-on PPI  5.  Left upper extremity paresthesias-secondary to stroke.  Added low-dose gabapentin.  Monitor with his poor renal function  6. Small peri anal abscess-draining abscess noted which is acute on chronic.  Dressing changes as recommended. -On Augmentin- finish off in 5 days  Physical therapy recommended acute inpatient rehab.   Discharge today   DISCHARGE CONDITIONS:   Guarded  CONSULTS OBTAINED:   Treatment Team:  Catarina Hartshorn, MD Anthonette Legato, MD  DRUG ALLERGIES:   Allergies  Allergen Reactions  . Ace Inhibitors     Other reaction(s): Other (See Comments) Renal failure.  . Isosorbide Nitrate     Other reaction(s): Headache Stomach ache,nausea  . Nsaids     Other reaction(s): Unknown  . Prednisone Other (See Comments)    Feels like pins/needles/sharp stick on the inside   DISCHARGE MEDICATIONS:   Allergies as of 08/19/2017      Reactions   Ace Inhibitors    Other reaction(s): Other (See Comments) Renal failure.   Isosorbide Nitrate    Other reaction(s): Headache Stomach ache,nausea   Nsaids    Other reaction(s): Unknown   Prednisone Other (See Comments)   Feels like pins/needles/sharp stick on the inside      Medication List    STOP taking these medications   furosemide 20 MG tablet Commonly known as:  LASIX   hydrochlorothiazide 25 MG tablet Commonly known as:  HYDRODIURIL     TAKE these medications   ALPRAZolam 0.5 MG tablet Commonly known as:  XANAX Take 1 tablet (0.5 mg total) by mouth 2 (two) times daily as needed for anxiety.   amoxicillin-clavulanate 500-125 MG tablet Commonly known as:  AUGMENTIN Take 1 tablet (500 mg total) by mouth every 12 (twelve) hours for 5 days.   ANORO ELLIPTA 62.5-25 MCG/INH Aepb Generic drug:   umeclidinium-vilanterol Inhale 1 puff into the lungs daily.   aspirin 81 MG chewable tablet Chew 1 tablet (81 mg total) by mouth daily. Start taking on:  08/20/2017   atorvastatin 40 MG tablet Commonly known as:  LIPITOR Take 1 tablet (40 mg total) by mouth daily at 6 PM.   calcitRIOL 0.25 MCG capsule Commonly known as:  ROCALTROL Take 1 capsule (0.25 mcg total) by mouth every Monday, Wednesday, and Friday. Start taking on:  08/21/2017   citalopram 20 MG tablet Commonly known as:  CELEXA Take 30 mg by mouth daily.   gabapentin 100 MG capsule Commonly known as:  NEURONTIN Take 2 capsules (200 mg total) by mouth 2 (two) times daily.   metoprolol tartrate 25 MG tablet Commonly known as:  LOPRESSOR Take 25 mg by mouth 2 (two) times daily.   omeprazole 20 MG capsule Commonly known as:  PRILOSEC Take 20 mg by mouth 2 (two) times daily.   PROAIR HFA 108 (90 Base) MCG/ACT inhaler Generic drug:  albuterol Inhale 2 puffs into the lungs every 6 (six) hours as needed for wheezing or shortness of breath. What changed:  Another medication with the same name was added. Make sure you understand how and when to take each.   albuterol (2.5 MG/3ML) 0.083% nebulizer solution Commonly known as:  PROVENTIL Inhale 3 mLs into the lungs every 6 (six) hours as needed for wheezing or shortness of breath. What changed:  You were already taking a medication with the same name, and this prescription was added. Make sure you understand how and when to take each.   tamsulosin 0.4 MG Caps capsule Commonly known as:  FLOMAX Take 0.4 mg by mouth daily after breakfast.   traMADol 50 MG tablet Commonly known as:  ULTRAM Take 1 tablet (50 mg total) by mouth every 6 (six) hours as needed for moderate pain.   VITRON-C 65-125 MG Tabs Generic drug:  Iron-Vitamin C Take 1 tablet by mouth daily.        DISCHARGE INSTRUCTIONS:   1.  PCP follow-up in 1 to 2 weeks 2.  Neurology follow-up in 2 weeks 3.   Nephrology follow-up in 2 to 3 weeks  DIET:   Cardiac diet  ACTIVITY:   Activity as tolerated  OXYGEN:   Home Oxygen: No.  Oxygen Delivery: room air  DISCHARGE LOCATION:   nursing home   If you experience worsening of your admission symptoms, develop shortness of breath, life threatening emergency, suicidal or homicidal thoughts you must seek medical attention immediately by calling 911 or calling your MD immediately  if symptoms less severe.  You Must read complete instructions/literature along with all the possible adverse reactions/side effects for all the Medicines you take and that have been prescribed to you. Take any new Medicines after you have completely understood and accpet all the possible adverse reactions/side effects.   Please note  You were cared for by a hospitalist during your hospital stay. If you have any questions about your discharge medications or the care you received while you were in the hospital after  you are discharged, you can call the unit and asked to speak with the hospitalist on call if the hospitalist that took care of you is not available. Once you are discharged, your primary care physician will handle any further medical issues. Please note that NO REFILLS for any discharge medications will be authorized once you are discharged, as it is imperative that you return to your primary care physician (or establish a relationship with a primary care physician if you do not have one) for your aftercare needs so that they can reassess your need for medications and monitor your lab values.    On the day of Discharge:  VITAL SIGNS:   Blood pressure 116/77, pulse 68, temperature 97.6 F (36.4 C), temperature source Oral, resp. rate 18, height 6' (1.829 m), weight 86.2 kg (190 lb), SpO2 95 %.  PHYSICAL EXAMINATION:    GENERAL:  82 y.o.-year-old patient lying in the bed with no acute distress.  EYES: Pupils equal, round, reactive to light and  accommodation. No scleral icterus. Extraocular muscles intact. Right eye erythematous conjunctiva HEENT: Head atraumatic, normocephalic. Oropharynx and nasopharynx clear.  NECK:  Supple, no jugular venous distention. No thyroid enlargement, no tenderness.  LUNGS: Normal breath sounds bilaterally, no wheezing, rales,rhonchi or crepitation. No use of accessory muscles of respiration. Decreased basilar breath sounds CARDIOVASCULAR: S1, S2 normal. No  rubs, or gallops. 2/6 systolic murmur present ABDOMEN: Soft, nontender, nondistended. Bowel sounds present. No organomegaly or mass.  EXTREMITIES: No pedal edema, cyanosis, or clubbing.  NEUROLOGIC: Cranial nerves II through XII are intact. right facial droop noted. Sensation intact.  H improved left arm weakness.  Today the strength is -LUE is 3/5 and LLE is 3+/5 weakness Gait not checked.  PSYCHIATRIC: The patient is  alert and oriented x3 SKIN: No obvious rash, lesion, or ulcer.    DATA REVIEW:   CBC Recent Labs  Lab 08/15/17 0410  WBC 6.3  HGB 11.2*  HCT 32.7*  PLT 165    Chemistries  Recent Labs  Lab 08/13/17 1344  08/19/17 0519  NA 139   < > 140  K 4.5   < > 4.8  CL 112*   < > 113*  CO2 21*   < > 20*  GLUCOSE 123*   < > 107*  BUN 48*   < > 40*  CREATININE 3.96*   < > 3.22*  CALCIUM 8.7*   < > 8.3*  MG 2.1  --   --   AST 15  --   --   ALT 12  --   --   ALKPHOS 112  --   --   BILITOT 0.5  --   --    < > = values in this interval not displayed.     Microbiology Results  No results found for this or any previous visit.  RADIOLOGY:  No results found.   Management plans discussed with the patient, family and they are in agreement.  CODE STATUS:     Code Status Orders  (From admission, onward)        Start     Ordered   08/15/17 0358  Full code  Continuous     08/15/17 0358    Code Status History    Date Active Date Inactive Code Status Order ID Comments User Context   08/13/2017 1704 08/15/2017 0358 Full  Code [id number]  Demetrios Loll, MD Inpatient   08/04/2015 1541 08/04/2015 2020 Full Code [id number]  Yolonda Kida, MD  Inpatient    Advance Directive Documentation     Most Recent Value  Type of Advance Directive  Living will  Pre-existing out of facility DNR order (yellow form or pink MOST form)  -  "MOST" Form in Place?  -      TOTAL TIME TAKING CARE OF THIS PATIENT: 38 minutes.    Floyd Wade M.D on 08/19/2017 at 12:33 PM  Between 7am to 6pm - Pager - [phone]  After 6pm go to [email or web address] - Proofreader  [employer] [facility]  Office  [phone]  CC: Primary care physician; Rusty Aus, MD   Note: This dictation was prepared with Dragon dictation along with smaller phrase technology. Any transcriptional errors that result from this process are unintentional.

## 2017-08-19 NOTE — Progress Notes (Signed)
Physical Therapy Treatment Patient Details Name: [name] MRN: [id number] DOB: 1935/11/12 Today's Date: 08/19/2017    History of Present Illness Pt is 82 y.o. 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    PT Comments    Pt is progressing well towards goals today. Pt was able to perform bed mobility with no physical assist today, transfer with Mod A x2, and ambulate 5' with RW/Mod A x2/chair follow, see below for details. Pt was limited by fatigue with RPE  (mod to hard during activity). Pt still with significant functional mobility from baseline 2/2 fatigue, weakness, poor motor planning, and balance deficits. Current d/c recommendations continue to remain appropriate at this time to CIR 2/2 need of physical assistance and willingness to work with PT.   Follow Up Recommendations  CIR     Equipment Recommendations       Recommendations for Other Services       Precautions / Restrictions Precautions Precautions: Fall Restrictions Weight Bearing Restrictions: No    Mobility  Bed Mobility Overal bed mobility: Needs Assistance Bed Mobility: Sit to Supine       Sit to supine: Supervision   General bed mobility comments: Pt benefited from supervision for sit to supine for safety 2/2 poor motor planning and L side weakness. Supine to sit was deffered beascuse pt found in chair and returned to bed  Transfers Overall transfer level: Needs assistance Equipment used: 2 person hand held assist Transfers: Sit to/from Stand Sit to Stand: Mod assist;+2 physical assistance         General transfer comment: Pt required Mod A x2 at gait belt and hand hold for  transfer secondary to weakness, poor motor planning, and balance deficits. Pt did show improved ability to weight bear through affected LE, but still had multiple bouts of knee buckling. Pt also benefited from verbal/tactile cueing to progress LEs with transfer.  Ambulation/Gait Ambulation/Gait assistance: Mod assist;+2 physical assistance Gait Distance (Feet): 5 Feet Assistive device: Rolling walker (2 wheeled)       General Gait Details: Pt was able to ambulate 5' with Mod A x2 at gait belt and immdeaite chair follow. Pt had posterior lean that improved with verbal cueing, but worsened with fatigue. Pt had mild R side lean that improved with tactile/verbal cueing, but worsened with fatigue. Pt had difficulty progressing L LE and thus demonstrated knee extension and posterior lean with LE progression.    Stairs             Wheelchair Mobility    Modified Rankin (Stroke Patients Only)       Balance Overall balance assessment: Needs assistance Sitting-balance support: No upper extremity supported Sitting balance-Leahy Scale: Fair     Standing balance support: Bilateral upper extremity supported Standing balance-Leahy Scale: Poor Standing balance comment: pt weight shifts to L, but able to weight shift side to side with Mod A x2                            Cognition Arousal/Alertness: Awake/alert Behavior During Therapy: WFL for tasks assessed/performed Overall Cognitive Status: Within Functional Limits for tasks assessed  Exercises General Exercises - Lower Extremity Ankle Circles/Pumps: AROM;Left;15 reps;Seated(trace DF with 3+/5 MMT PF) Long Arc Quad: AROM;Left;15 reps Hip Flexion/Marching: AROM;Both;10 reps(Left very diminshed)    General Comments        Pertinent Vitals/Pain Pain Assessment: No/denies pain    Home Living                      Prior Function            PT Goals  (current goals can now be found in the care plan section) Acute Rehab PT Goals Patient Stated Goal: return to PLOF PT Goal Formulation: With patient Time For Goal Achievement: 09/08/17 Potential to Achieve Goals: Good Progress towards PT goals: Progressing toward goals    Frequency    7X/week      PT Plan Current plan remains appropriate    Co-evaluation              AM-PAC PT "6 Clicks" Daily Activity  Outcome Measure  Difficulty turning over in bed (including adjusting bedclothes, sheets and blankets)?: A Little Difficulty moving from lying on back to sitting on the side of the bed? : A Little Difficulty sitting down on and standing up from a chair with arms (e.g., wheelchair, bedside commode, etc,.)?: Unable Help needed moving to and from a bed to chair (including a wheelchair)?: A Lot Help needed walking in hospital room?: Total Help needed climbing 3-5 steps with a railing? : Total 6 Click Score: 11    End of Session Equipment Utilized During Treatment: Gait belt Activity Tolerance: Patient tolerated treatment well;Patient limited by fatigue Patient left: in bed;with call bell/phone within reach;with bed alarm set;with family/visitor present   PT Visit Diagnosis: Unsteadiness on feet (R26.81);Muscle weakness (generalized) (M62.81);Difficulty in walking, not elsewhere classified (R26.2) Hemiplegia - Right/Left: Left Hemiplegia - dominant/non-dominant: Non-dominant Hemiplegia - caused by: Cerebral infarction     Time: 6389-3734 PT Time Calculation (min) (ACUTE ONLY): 27 min  Charges:                        Rosario Adie, SPT    Rosario Adie 08/19/2017, 10:49 AM

## 2017-08-19 NOTE — Care Management Important Message (Signed)
Important Message  Patient Details  Name: JAXTIN RAIMONDO MRN: [id number] Date of Birth: June 16, 1935   Medicare Important Message Given:  Yes    [name] A Neel Buffone 08/19/2017, 10:54 AM

## 2017-08-19 NOTE — Progress Notes (Signed)
Patient arrived to unit and oriented to unit. Verbalized agreement to remain in bed until staff arrives to attend to needs. Oriented to unit activities. No complaints at this time.

## 2017-08-19 NOTE — Progress Notes (Signed)
OT Cancellation Note  Patient Details Name: SAIFULLAH JOLLEY MRN: [id number] DOB: Oct 17, 1935   Cancelled Treatment:    Reason Eval/Treat Not Completed: Other (comment). Upon attempt, pt using urinal. Requesting OT come back at later time. Pt expresses eagerness to work with OT later. Will re-attempt OT Tx this afternoon.   Jeni Salles, MPH, MS, OTR/L ascom [phone] 08/19/17, 11:38 AM

## 2017-08-19 NOTE — Care Management (Signed)
Patient transferred to Acute Impatient Rehab at [facility] via [facility]

## 2017-08-19 NOTE — H&P (Signed)
Physical Medicine and Rehabilitation Admission H&P    Chief Complaint  Patient presents with  . Dizziness    HPI: [name] is a 82 year old male with history of hypertension, CKD stage IV, GI bleed, COPD, CAD-not surgical candidate, peripheral neuropathy; who was admitted on 08/13/2017 with dizziness, left-sided numbness tingling and weakness.  History taken from chart review, family and patient. CT of head reviewed, unremarkable for acute intracranial process. He was found to have acute on chronic renal failure and started on IV fluids for hydration.  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 He had developed left-sided weakness on 7/31, and repeat MRI brain done revealing evolving acute right medullary infarct without associated hemorrhage and partially thrombosed large fusiform aneurysm to be stable.  Carotid Dopplers showed no significant ICA stenosis. 2 D echo showed EF of 60 to 65%, no wall abnormality.  Dr. Doy Mince recommended aspirin 325 mg daily as well as follow-up with interventional radiology to 3 weeks post discharge.  Nephrology following for input and IV fluids DC'd as renal status has improved.  No need for dialysis and anemia of chronic disease stable.  He was noted to have draining perianal abscess and Augmentin was added with dressing changes.  Patient continues to be limited by dizziness with balance deficits affecting ADLs as well as mobility.  CIR was recommended due to functional decline.   Review of Systems  Constitutional: Negative for chills and fever.  HENT: Positive for congestion. Negative for tinnitus.   Eyes: Negative.  Negative for blurred vision and double vision.  Respiratory: Positive for shortness of breath (with activity) and wheezing.        Nasal congestion  Cardiovascular: Negative for chest pain,  palpitations and leg swelling.  Gastrointestinal: Positive for constipation and heartburn. Negative for nausea and vomiting.  Genitourinary: Negative for dysuria and urgency.  Musculoskeletal: Positive for joint pain (worse. ) and myalgias.       Bilateral RTC injuries.   Skin: Negative for rash.  Neurological: Positive for sensory change (feet get numb/tingle at nights), speech change and focal weakness.  Psychiatric/Behavioral: Negative for depression. The patient is nervous/anxious. The patient does not have insomnia.   All other systems reviewed and are negative.     Past Medical History:  Diagnosis Date  . Aortic stenosis   . CAD (coronary artery disease)   . CKD (chronic kidney disease) stage 4, GFR 15-29 ml/min (HCC)   . Hereditary and idiopathic peripheral neuropathy   . HTN (hypertension)   . Pulmonary nodules     Past Surgical History:  Procedure Laterality Date  . CARDIAC CATHETERIZATION N/A 08/04/2015   Procedure: Right/Left Heart Cath and Coronary Angiography;  Surgeon: Yolonda Kida, MD;  Location: [facility];  Service: Cardiovascular;  Laterality: N/A;    Family History  Problem Relation Age of Onset  . Hypertension Mother   . Heart attack Father     Social History:  Married. Independent but limited due to cardiac and respiratory issues. He smoked for 30+ years . Quit 10 years ago. He has never used smokeless tobacco. He reports that he does not drink alcohol or use drugs.    Allergies  Allergen Reactions  . Ace Inhibitors     Other reaction(s): Other (See Comments) Renal failure.  . Isosorbide Nitrate     Other reaction(s):  Headache Stomach ache,nausea  . Nsaids     Other reaction(s): Unknown  . Prednisone Other (See Comments)    Feels like pins/needles/sharp stick on the inside    Medications Prior to Admission  Medication Sig Dispense Refill  . albuterol (PROVENTIL) (2.5 MG/3ML) 0.083% nebulizer solution Inhale 3 mLs into the lungs  every 6 (six) hours as needed for wheezing or shortness of breath. 75 mL 12  . ALPRAZolam (XANAX) 0.5 MG tablet Take 1 tablet (0.5 mg total) by mouth 2 (two) times daily as needed for anxiety. 20 tablet 0  . amoxicillin-clavulanate (AUGMENTIN) 500-125 MG tablet Take 1 tablet (500 mg total) by mouth every 12 (twelve) hours for 5 days. 10 tablet 0  . ANORO ELLIPTA 62.5-25 MCG/INH AEPB Inhale 1 puff into the lungs daily.     Derrill Memo ON 08/20/2017] aspirin 81 MG chewable tablet Chew 1 tablet (81 mg total) by mouth daily. 30 tablet 2  . atorvastatin (LIPITOR) 40 MG tablet Take 1 tablet (40 mg total) by mouth daily at 6 PM. 30 tablet 2  . [START ON 08/21/2017] calcitRIOL (ROCALTROL) 0.25 MCG capsule Take 1 capsule (0.25 mcg total) by mouth every Monday, Wednesday, and Friday. 20 capsule 1  . citalopram (CELEXA) 20 MG tablet Take 30 mg by mouth daily.     Marland Kitchen gabapentin (NEURONTIN) 100 MG capsule Take 2 capsules (200 mg total) by mouth 2 (two) times daily. 60 capsule 0  . Iron-Vitamin C (VITRON-C) 65-125 MG TABS Take 1 tablet by mouth daily.    . metoprolol tartrate (LOPRESSOR) 25 MG tablet Take 25 mg by mouth 2 (two) times daily.     Marland Kitchen omeprazole (PRILOSEC) 20 MG capsule Take 20 mg by mouth 2 (two) times daily.    Marland Kitchen PROAIR HFA 108 (90 Base) MCG/ACT inhaler Inhale 2 puffs into the lungs every 6 (six) hours as needed for wheezing or shortness of breath.     . tamsulosin (FLOMAX) 0.4 MG CAPS capsule Take 0.4 mg by mouth daily after breakfast.     . traMADol (ULTRAM) 50 MG tablet Take 1 tablet (50 mg total) by mouth every 6 (six) hours as needed for moderate pain. 20 tablet 0    Drug Regimen Review  Drug regimen was reviewed and remains appropriate with no significant issues identified  Home: Home Living Family/patient expects to be discharged to:: Private residence Living Arrangements: Spouse/significant other Available Help at Discharge: Family(wife limited physically and family can assist  intermittently) Type of Home: House Home Access: Stairs to enter [employer] of Steps: 2 Entrance Stairs-Rails: Left Home Layout: One level Bathroom Shower/Tub: Tub/shower unit, Door [employer] Toilet: Handicapped height Bathroom Accessibility: Yes Home Equipment: Environmental consultant - 2 wheels, Environmental consultant - 4 wheels, [employer] - quad, Bedside commode  Lives With: Spouse   Functional History: Prior Function Level of Independence: Independent Comments: community ambulator, indep with ADL and IADL  Functional Status:  Mobility: Bed Mobility Overal bed mobility: Needs Assistance Bed Mobility: Sit to Supine Rolling: Supervision Sidelying to sit: Min guard Supine to sit: Min guard Sit to supine: Supervision Sit to sidelying: Supervision General bed mobility comments: Pt benefited from supervision for sit to supine for safety 2/2 poor motor planning and L side weakness. Supine to sit was deffered beascuse pt found in chair and returned to bed Transfers Overall transfer level: Needs assistance Equipment used: 2 person hand held assist Transfers: Sit to/from Stand Sit to Stand: Mod assist, +2 physical assistance Stand pivot transfers: Mod assist, +2  physical assistance  Lateral/Scoot Transfers: Min assist, Mod assist General transfer comment: Pt required Mod A x2 at gait belt and hand hold for transfer secondary to weakness, poor motor planning, and balance deficits. Pt did show improved ability to weight bear through affected LE, but still had multiple bouts of knee buckling. Pt also benefited from verbal/tactile cueing to progress LEs with transfer. Ambulation/Gait Ambulation/Gait assistance: Mod assist, +2 physical assistance Gait Distance (Feet): 5 Feet Assistive device: Rolling walker (2 wheeled) General Gait Details: Pt was able to ambulate 5' with Mod A x2 at gait belt and immdeaite chair follow. Pt had posterior lean that improved with verbal cueing, but worsened with fatigue. Pt had mild  R side lean that improved with tactile/verbal cueing, but worsened with fatigue. Pt had difficulty progressing L LE and thus demonstrated knee extension and posterior lean with LE progression.     ADL: ADL Overall ADL's : Needs assistance/impaired Eating/Feeding: Bed level, Set up Eating/Feeding Details (indicate cue type and reason): set up of containers/packets, able to eat with R dom hand Grooming: Sitting, Min guard Grooming Details (indicate cue type and reason): CGA in sitting while using R dom hand, requires Mod-max assist to incorporate LUE into task Upper Body Bathing: Sitting, Minimal assistance, Moderate assistance Lower Body Bathing: Sitting/lateral leans, Moderate assistance, Maximal assistance Upper Body Dressing : Sitting, Moderate assistance Upper Body Dressing Details (indicate cue type and reason): demo provided re: hemi-dressing technique Lower Body Dressing: Sitting/lateral leans, Maximal assistance, Moderate assistance  Cognition: Cognition Overall Cognitive Status: Within Functional Limits for tasks assessed Orientation Level: Oriented X4 Cognition Arousal/Alertness: Awake/alert Behavior During Therapy: WFL for tasks assessed/performed Overall Cognitive Status: Within Functional Limits for tasks assessed General Comments: alert and oriented, follows commands well, demonstrates good safety awareness of LUE and makes attempts to adjust it's position using RUE to ensure safety/joint protection without verbal cues   Blood pressure 139/86, pulse 69, temperature (!) 97.5 F (36.4 C), temperature source Oral, resp. rate 18, height 6' (1.829 m), weight 86.2 kg (190 lb), SpO2 99 %. Physical Exam  Nursing note and vitals reviewed. Constitutional: He is oriented to person, place, and time. He appears well-developed.  obese  HENT:  Head: Normocephalic and atraumatic.  Wears full set dentures.   Eyes:  Ectropion right eye. Crusting left eye lids. Few beats nystagmus to  right lateral field.   Cardiovascular: Normal rate and regular rhythm.  Murmur heard. Respiratory: Effort normal. He has wheezes.  GI: Soft. Bowel sounds are normal.  Musculoskeletal:  Varicosities Left calf No edema or tenderness in extremities  Neurological: He is alert and oriented to person, place, and time.  Trace dysarthria.  Able to follow basic commands without difficulty.  Motor: Right upper extremity/right lower extremity: 5/5 proximal distal Left upper extremity: 3/5 proximal distal, lower extremity: 4+/5 proximal distal Sensation intact light touch  Skin:  Peri-anal MASD. Left inner buttock area with cystic lesion--no drainage expressed. Non tender to touch around rectum or around cyst.   Back with macular rash.   Psychiatric: He has a normal mood and affect. His behavior is normal.    Results for orders placed or performed during the hospital encounter of 08/13/17 (from the past 48 hour(s))  Basic metabolic panel     Status: Abnormal   Collection Time: 08/18/17  4:51 AM  Result Value Ref Range   Sodium 142 135 - 145 mmol/L   Potassium 5.1 3.5 - 5.1 mmol/L   Chloride 116 (H) 98 - 111  mmol/L   CO2 22 22 - 32 mmol/L   Glucose, Bld 101 (H) 70 - 99 mg/dL   BUN 35 (H) 8 - 23 mg/dL   Creatinine, Ser 3.03 (H) 0.61 - 1.24 mg/dL   Calcium 8.4 (L) 8.9 - 10.3 mg/dL   GFR calc non Af Amer 18 (L) >60 mL/min   GFR calc Af Amer 21 (L) >60 mL/min    Comment: (NOTE) The eGFR has been calculated using the CKD EPI equation. This calculation has not been validated in all clinical situations. eGFR's persistently <60 mL/min signify possible Chronic Kidney Disease.    Anion gap 4 (L) 5 - 15    Comment: Performed at [facility], [address] [place], [place] [address]  Basic metabolic panel     Status: Abnormal   Collection Time: 08/19/17  5:19 AM  Result Value Ref Range   Sodium 140 135 - 145 mmol/L   Potassium 4.8 3.5 - 5.1 mmol/L   Chloride 113 (H) 98 - 111 mmol/L    CO2 20 (L) 22 - 32 mmol/L   Glucose, Bld 107 (H) 70 - 99 mg/dL   BUN 40 (H) 8 - 23 mg/dL   Creatinine, Ser 3.22 (H) 0.61 - 1.24 mg/dL   Calcium 8.3 (L) 8.9 - 10.3 mg/dL   GFR calc non Af Amer 17 (L) >60 mL/min   GFR calc Af Amer 20 (L) >60 mL/min    Comment: (NOTE) The eGFR has been calculated using the CKD EPI equation. This calculation has not been validated in all clinical situations. eGFR's persistently <60 mL/min signify possible Chronic Kidney Disease.    Anion gap 7 5 - 15    Comment: Performed at [facility], [address] [place], [place] [address]   Dg Chest 2 View  Result Date: 08/19/2017 CLINICAL DATA:  Wheezing. EXAM: CHEST - 2 VIEW COMPARISON:  Radiograph of August 13, 2017. FINDINGS: The heart size and mediastinal contours are within normal limits. Both lungs are clear. The visualized skeletal structures are unremarkable. IMPRESSION: No active cardiopulmonary disease. Electronically Signed   By: Marijo Conception, M.D.   On: 08/19/2017 17:02       Medical Problem List and Plan: 1.  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 2.  DVT Prophylaxis/Anticoagulation: Pharmaceutical: Lovenox 3. Idiopathic neuropathy/Pain Management: Gabapentin was added for LUE dysesthesias. Continue ultram prn as at home.  4. Mood: LCSW to follow for evaluation and support.  5. Neuropsych: This patient is capable of making decisions on his own behalf. 6. Skin/Wound Care: routine pressure relief measures.  7. Fluids/Electrolytes/Nutrition: Monitor I/O. Check lytes in am.  8. COPD: Reports increase in Wheezing/SOB due to URI? Will check CXR. increase albuterol to every 4 hours prn. Continue Onoro. Encourage IS to help with atelectasis 9.  Acute on chronic renal failure--stage IV:  Baseline SCr-3.65. Renal status improving. On calcitriol for secondary hyperparathyroidism. Continue to monitor with serial  checks. Avoid ARB/ No ACE due to allergy.  10. H/o gastritis due to NSAIDs/ GERD: Continue PPI.  11. Dyslipidemia: on Lipitor.  12. CAD/AS: Treated medically with metoprolol, ASA and lipitor.  13. Peri anal cyst:  With acute on chronic inflammation. No prior treatment per reports. Started on Augmentin on 8/3 for  7-10 days treatment. Continue moist warm compresses to help drain.    14. Anemia of chronic disease: Hgb- 11.2-12.5 range. Continue to monitor.  15. Constipation: will schedule Senna  S 2 at bedtime.   Post Admission Physician Evaluation: 1. Preadmission assessment reviewed and changes made below. 2. Functional deficits secondary  to right medullary infarct with perianal abscess. 3. Patient is admitted to receive collaborative, interdisciplinary care between the physiatrist, rehab nursing staff, and therapy team. 4. Patient's level of medical complexity and substantial therapy needs in context of that medical necessity cannot be provided at a lesser intensity of care such as a SNF. 5. Patient has experienced substantial functional loss from his/her baseline which was documented above under the "Functional History" and "Functional Status" headings.  Judging by the patient's diagnosis, physical exam, and functional history, the patient has potential for functional progress which will result in measurable gains while on inpatient rehab.  These gains will be of substantial and practical use upon discharge  in facilitating mobility and self-care at the household level. 74. Physiatrist will provide 24 hour management of medical needs as well as oversight of the therapy plan/treatment and provide guidance as appropriate regarding the interaction of the two. 7. 24 hour rehab nursing will assist with safety, disease management and patient education  and help integrate therapy concepts, techniques,education, etc. 8. PT will assess and treat for/with: Lower extremity strength, range of motion, stamina,  balance, functional mobility, safety, adaptive techniques and equipment, coping skills, pain control, education. Goals are: supervision/min assist. 9. OT will assess and treat for/with: ADL's, functional mobility, safety, upper extremity strength, adaptive techniques and equipment, ego support, and community reintegration.   Goals are: supervision/min assist. Therapy may proceed with showering this patient. 10. Case Management and Social Worker will assess and treat for psychological issues and discharge planning. 11. Team conference will be held weekly to assess progress toward goals and to determine barriers to discharge. 12. Patient will receive at least 3 hours of therapy per day at least 5 days per week. 13. ELOS: 10-14 days.       14. Prognosis:  good  I have personally performed a face to face diagnostic evaluation, including, but not limited to relevant history and physical exam findings, of this patient and developed relevant assessment and plan.  Additionally, I have reviewed and concur with the physician assistant's documentation above.  Delice Lesch, MD, ABPMR Bary Leriche, PA-C 08/19/2017

## 2017-08-20 ENCOUNTER — Inpatient Hospital Stay (HOSPITAL_COMMUNITY): Payer: Medicare Other | Admitting: Physical Therapy

## 2017-08-20 ENCOUNTER — Inpatient Hospital Stay (HOSPITAL_COMMUNITY): Payer: Medicare Other | Admitting: Occupational Therapy

## 2017-08-20 ENCOUNTER — Inpatient Hospital Stay (HOSPITAL_COMMUNITY): Payer: Medicare Other

## 2017-08-20 DIAGNOSIS — G5621 Lesion of ulnar nerve, right upper limb: Secondary | ICD-10-CM

## 2017-08-20 DIAGNOSIS — I6312 Cerebral infarction due to embolism of basilar artery: Secondary | ICD-10-CM

## 2017-08-20 DIAGNOSIS — M21372 Foot drop, left foot: Secondary | ICD-10-CM

## 2017-08-20 DIAGNOSIS — I69354 Hemiplegia and hemiparesis following cerebral infarction affecting left non-dominant side: Principal | ICD-10-CM

## 2017-08-20 LAB — CBC WITH DIFFERENTIAL/PLATELET
ABS IMMATURE GRANULOCYTES: 0.1 10*3/uL (ref 0.0–0.1)
BASOS ABS: 0 10*3/uL (ref 0.0–0.1)
BASOS PCT: 1 %
EOS ABS: 0.3 10*3/uL (ref 0.0–0.7)
Eosinophils Relative: 5 %
HCT: 34.4 % — ABNORMAL LOW (ref 39.0–52.0)
Hemoglobin: 10.6 g/dL — ABNORMAL LOW (ref 13.0–17.0)
Immature Granulocytes: 1 %
Lymphocytes Relative: 16 %
Lymphs Abs: 1 10*3/uL (ref 0.7–4.0)
MCH: 27.2 pg (ref 26.0–34.0)
MCHC: 30.8 g/dL (ref 30.0–36.0)
MCV: 88.4 fL (ref 78.0–100.0)
MONOS PCT: 11 %
Monocytes Absolute: 0.7 10*3/uL (ref 0.1–1.0)
NEUTROS ABS: 4.1 10*3/uL (ref 1.7–7.7)
Neutrophils Relative %: 66 %
Platelets: 165 10*3/uL (ref 150–400)
RBC: 3.89 MIL/uL — ABNORMAL LOW (ref 4.22–5.81)
RDW: 15.8 % — ABNORMAL HIGH (ref 11.5–15.5)
WBC: 6.1 10*3/uL (ref 4.0–10.5)

## 2017-08-20 LAB — COMPREHENSIVE METABOLIC PANEL
ALK PHOS: 90 U/L (ref 38–126)
ALT: 21 U/L (ref 0–44)
AST: 18 U/L (ref 15–41)
Albumin: 3.1 g/dL — ABNORMAL LOW (ref 3.5–5.0)
Anion gap: 9 (ref 5–15)
BUN: 41 mg/dL — AB (ref 8–23)
CALCIUM: 8.7 mg/dL — AB (ref 8.9–10.3)
CO2: 22 mmol/L (ref 22–32)
CREATININE: 3.25 mg/dL — AB (ref 0.61–1.24)
Chloride: 108 mmol/L (ref 98–111)
GFR calc Af Amer: 19 mL/min — ABNORMAL LOW (ref >60)
GFR, EST NON AFRICAN AMERICAN: 17 mL/min — AB (ref >60)
Glucose, Bld: 95 mg/dL (ref 70–99)
Potassium: 5.1 mmol/L (ref 3.5–5.1)
Sodium: 139 mmol/L (ref 135–145)
TOTAL PROTEIN: 6.1 g/dL — AB (ref 6.5–8.1)
Total Bilirubin: 0.8 mg/dL (ref 0.3–1.2)

## 2017-08-20 LAB — GLUCOSE, CAPILLARY
GLUCOSE-CAPILLARY: 74 mg/dL (ref 70–99)
Glucose-Capillary: 103 mg/dL — ABNORMAL HIGH (ref 70–99)
Glucose-Capillary: 110 mg/dL — ABNORMAL HIGH (ref 70–99)

## 2017-08-20 NOTE — Evaluation (Signed)
Recreational Therapy Assessment and Plan  Patient Details  Name: [name] MRN: [id number] Date of Birth: December 11, 1935 Today's Date: 08/20/2017  Rehab Potential: Good ELOS: ~2 weeks  Assessment  Problem List:      Patient Active Problem List   Diagnosis Date Noted  . 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 Acute brainstem infarction ([place])   . Generalized OA   . Gastritis and gastroduodenitis   . History of GI bleed   . Chronic obstructive pulmonary disease ([place])   . AKI (acute kidney injury) ([place])   . Stage 3 chronic kidney disease ([place])   . Perianal abscess   . CVA (cerebral vascular accident) ([place]) 08/14/2017  . Renal failure (ARF), acute on chronic (HCC) 08/13/2017  . Thoracic aortic aneurysm without rupture ([place]) 12/04/2016  . Essential hypertension 12/04/2016    Past Medical History:      Past Medical History:  Diagnosis Date  . Aortic stenosis   . CAD (coronary artery disease)   . CKD (chronic kidney disease) stage 4, GFR 15-29 ml/min (HCC)   . Hereditary and idiopathic peripheral neuropathy   . HTN (hypertension)   . Pulmonary nodules    Past Surgical History:       Past Surgical History:  Procedure Laterality Date  . CARDIAC CATHETERIZATION N/A 08/04/2015   Procedure: Right/Left Heart Cath and Coronary Angiography;  Surgeon: Yolonda Kida, MD;  Location: [facility];  Service: Cardiovascular;  Laterality: N/A;    Assessment & Plan Clinical Impression: Patient is a 82 y.o. year old male with history of hypertension, CKD stage IV, GI bleed, COPD, CAD-not surgical candidate, peripheral neuropathy; who was admitted on 08/13/2017 with dizziness, left-sided numbness tingling and weakness. History taken from chart review, family and patient. CT of head reviewed, unremarkable for acute intracranial process. He was found to have acute on chronic renal failure and started on IV fluids for hydration. 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 He had developed left-sided weakness on 7/31, and repeat MRI brain done revealing evolving acute right medullary infarct without associated hemorrhage and partially thrombosed large fusiform aneurysm to be stable. Carotid Dopplers showed no significant ICA stenosis. 2 D echo showed EF of 60 to 65%, no wall abnormality. Dr. Doy Mince recommended aspirin 325 mg daily as well as follow-up with interventional radiology to 3 weeks post discharge. Patient transferred to CIR on 08/19/2017 .    Pt presents with decreased activity tolerance, decreased functional mobility, decreased balance, decreased coordination, decreased attention, decreased awareness, decreased problem solving, decreased safety awareness Limiting pt's independence with leisure/community pursuits.  Plan Min 1 TR session >20 minutes during LOS Recommendations for other services: None   Discharge Criteria: Patient will be discharged from TR if patient refuses treatment 3 consecutive times without medical reason.  If treatment goals not met, if there is a change in medical status, if patient makes no progress towards goals or if patient is discharged from hospital.  The above assessment, treatment plan, treatment alternatives and goals were discussed and mutually agreed upon: by patient  [name] 08/20/2017, 4:01 PM

## 2017-08-20 NOTE — Progress Notes (Signed)
Social Work  Social Work Assessment and Plan  Patient Details  Name: [name] MRN: [id number] Date of Birth: 1935-09-22  Today's Date: 08/20/2017  Problem List:  Patient Active Problem List   Diagnosis Date Noted  . 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 Acute brainstem infarction ([place])   . Generalized OA   . Gastritis and gastroduodenitis   . History of GI bleed   . Chronic obstructive pulmonary disease ([place])   . AKI (acute kidney injury) ([place])   . Stage 3 chronic kidney disease ([place])   . Perianal abscess   . CVA (cerebral vascular accident) ([place]) 08/14/2017  . Renal failure (ARF), acute on chronic (HCC) 08/13/2017  . Thoracic aortic aneurysm without rupture ([place]) 12/04/2016  . Essential hypertension 12/04/2016   Past Medical History:  Past Medical History:  Diagnosis Date  . Aortic stenosis   . CAD (coronary artery disease)   . CKD (chronic kidney disease) stage 4, GFR 15-29 ml/min (HCC)   . Hereditary and idiopathic peripheral neuropathy   . HTN (hypertension)   . Pulmonary nodules    Past Surgical History:  Past Surgical History:  Procedure Laterality Date  . CARDIAC CATHETERIZATION N/A 08/04/2015   Procedure: Right/Left Heart Cath and Coronary Angiography;  Surgeon: Yolonda Kida, MD;  Location: [facility];  Service: Cardiovascular;  Laterality: N/A;   Social History:  reports that he has never smoked. He has never used smokeless tobacco. He reports that he does not drink alcohol or use drugs.  Family / Support Systems Marital Status: Married Patient Roles: Spouse, Parent, Other (Comment)(grandparent) Spouse/Significant Other: [name] [phone] Children: [name] and Risk manager Other Supports: Church members Anticipated Caregiver: Wife and granddaughter can assist some Ability/Limitations of Caregiver: Wife is only able to do supervision due to her own health issues. Granddaughter can assist  intermittently Caregiver Availability: 24/7 Family Dynamics: Close knit with two daughter's and grandchildren. Pt voiced they see each other often and he likes this. They have church members and friends who are supportive also.  Social History Preferred language: English Religion:  Cultural Background: No issues Education: High School Read: Yes Write: Yes Employment Status: Retired Freight forwarder Issues: No issues Guardian/Conservator: none-according to MD pt is capable of making his own decisions while here. Wife plans to be here often to see his progress   Abuse/Neglect Abuse/Neglect Assessment Can Be Completed: Yes Physical Abuse: Denies Verbal Abuse: Denies Sexual Abuse: Denies Exploitation of patient/patient's resources: Denies Self-Neglect: Denies  Emotional Status Pt's affect, behavior adn adjustment status: Pt is motivated to do well and recover from this stroke. He feels he has made some progress but his first day has exhausted him and he is really tired. He voiced his wife can not assist him so he needs to do well here. Recent Psychosocial Issues: other health issues-limited due to cardiac issues Pyschiatric History: No history deferred depression screen due to doing well and still adjusting to the new unit. He may benefit from seeing neuro-psych while here will ask input from team. Substance Abuse History: No issues  Patient / Family Perceptions, Expectations & Goals Pt/Family understanding of illness & functional limitations: Pt and wife can explain his stroke and deficits. Both have spoken with the MD and feel they have a good understanding of his treatment plan going forward. Pt is hopeful he will do well here, so is wife. Premorbid pt/family roles/activities: husband, father, granddaughter, church member, friend, home owner, etc Anticipated changes in  roles/activities/participation: resume Pt/family expectations/goals: Pt states: " I need to get moving so  I can go home from her, my wife can not help me, she has health issues of her own."  Wife states: " I can't lift him I told that other lady that."  [employer]: None Premorbid Home Care/DME Agencies: Other (Comment)(has RW, Rollator, BSC) Transportation available at discharge: Wife does drive and he was also Resource referrals recommended: Support group (specify)  Discharge Planning Living Arrangements: Spouse/significant other Support Systems: Spouse/significant other, Children, Other relatives, Friends/neighbors, Social worker community Type of Residence: Private residence Insurance Resources: Multimedia programmer (specify)(UHC_Medicare) Financial Resources: Radio broadcast assistant Screen Referred: No Living Expenses: Own Money Management: Spouse, Patient Does the patient have any problems obtaining your medications?: Yes (Describe)(at times meds are difficult to afford-depends upon what tier they are on) Home Management: Wife does the home management due to pt's cardiac issues Patient/Family Preliminary Plans: Return home with wife who can only provide supervision level due to her own health issues. Their granddaughter can assist some but not regularly. Pt  will need to be at least supervision level to be able to go home safely with wife. Sw Barriers to Discharge: Decreased caregiver support Sw Barriers to Discharge Comments: wife can not provide physical care Social Work Anticipated Follow Up Needs: HH/OP, Support Group  Clinical Impression Pleasant gentleman who is celebrating his birthday today. He is motivated to do well and realizes his wife's limitations. He will need to reach supervision level to be able to return home safely. Awaiting team's evaluations and will work on a safe discharge plan. Pt may need rest breaks in between his therapies due to he is exhausted today and was limited with his cardiac issues prior to admission to the hospital. Will  continue to work on discharge plans.  Elease Hashimoto 08/20/2017, 2:16 PM

## 2017-08-20 NOTE — Care Management Note (Signed)
[facility] Individual Statement of Services  Patient Name:  [name]  Date:  08/20/2017  Welcome to the [facility].  Our goal is to provide you with an individualized program based on your diagnosis and situation, designed to meet your specific needs.  With this comprehensive rehabilitation program, you will be expected to participate in at least 3 hours of rehabilitation therapies Monday-Friday, with modified therapy programming on the weekends.  Your rehabilitation program will include the following services:  Physical Therapy (PT), Occupational Therapy (OT), Speech Therapy (ST), 24 hour per day rehabilitation nursing, Case Management (Social Worker), Rehabilitation Medicine, Nutrition Services and Pharmacy Services  Weekly team conferences will be held on Wednesday to discuss your progress.  Your Social Worker will talk with you frequently to get your input and to update you on team discussions.  Team conferences with you and your family in attendance may also be held.  Expected length of stay: 14-18 days Overall anticipated outcome: supervision-min assist level  Depending on your progress and recovery, your program may change. Your Social Worker will coordinate services and will keep you informed of any changes. Your Social Worker's name and contact numbers are listed  below.  The following services may also be recommended but are not provided by the [facility] will be made to provide these services after discharge if needed.  Arrangements include referral to agencies that provide these services.  Your insurance has been verified to be:  UHC-Medicare Your primary doctor is:  Emily Filbert  Pertinent information will be shared with your doctor and your insurance company.  Social Worker:  Ovidio Kin, [place] or [phone]  Information discussed with and copy given to patient by: Elease Hashimoto, 08/20/2017, 1:42 PM

## 2017-08-20 NOTE — Evaluation (Addendum)
Physical Therapy Assessment and Plan  Patient Details  Name: KANAAN KAGAWA MRN: [id number] Date of Birth: 01-24-1935  PT Diagnosis: Abnormal posture, Abnormality of gait, Coordination disorder, Difficulty walking, Hemiplegia non-dominant, Impaired sensation and Muscle weakness Rehab Potential: Good ELOS: 14-18 days   Today's Date: 08/20/2017 PT Individual Time: 0800-0910 PT Individual Time Calculation (min): 70 min    Problem List:  Patient Active Problem List   Diagnosis Date Noted  . 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 Acute brainstem infarction ([place])   . Generalized OA   . Gastritis and gastroduodenitis   . History of GI bleed   . Chronic obstructive pulmonary disease ([place])   . AKI (acute kidney injury) ([place])   . Stage 3 chronic kidney disease ([place])   . Perianal abscess   . CVA (cerebral vascular accident) ([place]) 08/14/2017  . Renal failure (ARF), acute on chronic (HCC) 08/13/2017  . Thoracic aortic aneurysm without rupture ([place]) 12/04/2016  . Essential hypertension 12/04/2016    Past Medical History:  Past Medical History:  Diagnosis Date  . Aortic stenosis   . CAD (coronary artery disease)   . CKD (chronic kidney disease) stage 4, GFR 15-29 ml/min (HCC)   . Hereditary and idiopathic peripheral neuropathy   . HTN (hypertension)   . Pulmonary nodules    Past Surgical History:  Past Surgical History:  Procedure Laterality Date  . CARDIAC CATHETERIZATION N/A 08/04/2015   Procedure: Right/Left Heart Cath and Coronary Angiography;  Surgeon: Yolonda Kida, MD;  Location: [facility];  Service: Cardiovascular;  Laterality: N/A;    Assessment & Plan Clinical Impression: Patient is a 82 year old male with history of hypertension, CKD stage IV, GI bleed, COPD, CAD-not surgical candidate, peripheral neuropathy; who was admitted on 08/13/2017 with dizziness, left-sided numbness tingling and weakness.  History taken from chart review, family and  patient. CT of head reviewed, unremarkable for acute intracranial process. He was found to have acute on chronic renal failure and started on IV fluids for hydration.  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 He had developed left-sided weakness on 7/31, and repeat MRI brain done revealing evolving acute right medullary infarct without associated hemorrhage and partially thrombosed large fusiform aneurysm to be stable.  Carotid Dopplers showed no significant ICA stenosis. 2 D echo showed EF of 60 to 65%, no wall abnormality.  Dr. Doy Mince recommended aspirin 325 mg daily as well as follow-up with interventional radiology to 3 weeks post discharge. Nephrology following for input and IV fluids DC'd as renal status has improved.  No need for dialysis and anemia of chronic disease stable.  He was noted to have draining perianal abscess and Augmentin was added with dressing changes.  Patient continues to be limited by dizziness with balance deficits affecting ADLs as well as mobility.  CIR was recommended due to functional decline. Patient transferred to CIR on 08/19/2017 .   Patient currently requires max with mobility secondary to muscle weakness, decreased cardiorespiratoy endurance, impaired timing and sequencing, unbalanced muscle activation, decreased coordination and decreased motor planning, decreased problem solving, decreased safety awareness and decreased memory and decreased sitting balance, decreased standing balance, decreased postural control, hemiplegia and decreased balance strategies.  Prior to hospitalization, patient was independent  with mobility and lived with Spouse in a House home.  Home access is 2Stairs  to enter.  Patient will benefit from skilled PT intervention to maximize safe functional mobility, minimize fall risk and decrease  caregiver burden for planned discharge home with 24 hour supervision.  Anticipate patient will benefit from follow up [place] at discharge.  PT - End of Session Activity Tolerance: Tolerates < 10 min activity, no significant change in vital signs Endurance Deficit: Yes Endurance Deficit Description: decreased, increased work of breathing and wheezing w/ all functional mobility, O2 >98% on room air PT Assessment Rehab Potential (ACUTE/IP ONLY): Good PT Barriers to Discharge: Decreased caregiver support PT Barriers to Discharge Comments: wife unable to provide much physical assist PT Patient demonstrates impairments in the following area(s): Balance;Behavior;Endurance;Motor;Safety;Sensory PT Transfers Functional Problem(s): Bed Mobility;Bed to Chair;Car;Furniture;Floor PT Locomotion Functional Problem(s): Stairs;Wheelchair Mobility;Ambulation PT Plan PT Intensity: Minimum of 1-2 x/day ,45 to 90 minutes PT Frequency: 5 out of 7 days PT Duration Estimated Length of Stay: 14-18 days PT Treatment/Interventions: Ambulation/gait training;Disease management/prevention;Pain management;Stair training;Visual/perceptual remediation/compensation;Wheelchair propulsion/positioning;Therapeutic Activities;Patient/family education;DME/adaptive equipment instruction;Balance/vestibular training;Cognitive remediation/compensation;Psychosocial support;Therapeutic Exercise;Skin care/wound management;Functional mobility training;Community reintegration;UE/LE Strength taining/ROM;UE/LE Coordination activities;Splinting/orthotics;Neuromuscular re-education;Discharge planning PT Transfers Anticipated Outcome(s): supervision PT Locomotion Anticipated Outcome(s): min assist household gait PT Recommendation Follow Up Recommendations: Home health PT Patient destination: Home Equipment Recommended: To be determined  Skilled Therapeutic Intervention  Pt instructed patient in PT Evaluation and initiated treatment intervention;  see below for results. Required frequent rest breaks 2/2 fatigue and increased work of breathing. O2 > 98% on room air and wheezing, able to return to baseline w/ brief rest. Pt educated patient in POC, rehab potential, rehab goals, and discharge recommendations. Educated on importance of spending time out of bed during the day to help w/ breathing and to work on endurance to upright activity. Pt agreeable, ended session in w/c, call bell within reach and all needs met.   PT Evaluation Precautions/Restrictions Precautions Precautions: Fall Restrictions Weight Bearing Restrictions: No Pain Pain Assessment Pain Scale: 0-10 Pain Score: 0-No pain Home Living/Prior Functioning Home Living Available Help at Discharge: Family(wife unable to assist physically, children live neaby and able to assist PRN) Type of Home: House Home Access: Stairs to enter [employer] of Steps: 2 Entrance Stairs-Rails: Left Home Layout: One level Bathroom Shower/Tub: Tub/shower unit;Door [employer] Toilet: Handicapped height Bathroom Accessibility: Yes  Lives With: Spouse Prior Function Level of Independence: Independent with basic ADLs;Independent with transfers;Independent with homemaking with ambulation;Independent with gait  Able to Take Stairs?: Yes Driving: Yes Vocation: On disability Vocation Requirements: disability for cardiac issues Leisure: Hobbies-yes (Comment) Comments: enjoys playing the banjo, going to rodeo, etc Vision/Perception  Vision - Assessment Eye Alignment: Within Functional Limits Ocular Range of Motion: Within Functional Limits Alignment/Gaze Preference: Within Defined Limits Tracking/Visual Pursuits: Able to track stimulus in all quads without difficulty Saccades: Overshoots Perception Perception: Within Functional Limits Praxis Praxis: Intact  Cognition Overall Cognitive Status: No family/caregiver present to determine baseline cognitive  functioning Arousal/Alertness: Awake/alert Sensation Sensation Light Touch: Impaired by gross assessment(diminished sensation globally LLE, tingling w/ activity ) Coordination Gross Motor Movements are Fluid and Coordinated: No Fine Motor Movements are Fluid and Coordinated: No Heel Shin Test: impaired LLE Motor  Motor Motor: Hemiplegia Motor - Skilled Clinical Observations: L hemi, generalized weakness  Mobility Bed Mobility Bed Mobility: Rolling Right;Rolling Left;Supine to Sit;Sit to Supine Rolling Right: Supervision/verbal cueing Rolling Left: Supervision/Verbal cueing Supine to Sit: Supervision/Verbal cueing Sit to Supine: Supervision/Verbal cueing Transfers Transfers: Stand to Sit;Sit to Stand;Squat Pivot Transfers Sit to Stand: Minimal Assistance - Patient > 75% Stand  to Sit: Minimal Assistance - Patient > 75% Squat Pivot Transfers: Moderate Assistance - Patient 50-74% Transfer (Assistive device): None Locomotion  Gait Ambulation: Yes Gait Assistance: Maximal Assistance - Patient 25-49% Gait Distance (Feet): 10 Feet Assistive device: Other (Comment)(rail in hallway) Gait Assistance Details: Verbal cues for gait pattern;Manual facilitation for weight shifting;Manual facilitation for placement;Verbal cues for precautions/safety;Tactile cues for posture;Tactile cues for weight shifting;Tactile cues for initiation;Manual facilitation for weight bearing Gait Gait: Yes Gait Pattern: Impaired Gait Pattern: Narrow base of support;Left foot flat Gait velocity: decreased Stairs / Additional Locomotion Stairs: No Wheelchair Mobility Wheelchair Mobility: Yes Wheelchair Assistance: Chartered loss adjuster: Both upper extremities;Right lower extremity Wheelchair Parts Management: Needs assistance Distance: 68'  Trunk/Postural Assessment  Cervical Assessment Cervical Assessment: Within Functional Limits Thoracic Assessment Thoracic Assessment: Within  Functional Limits Lumbar Assessment Lumbar Assessment: Within Functional Limits Postural Control Postural Control: Deficits on evaluation(posterior lean bias during mobility, able to correct w/ max cues) Righting Reactions: delayed  Balance Balance Balance Assessed: Yes Static Sitting Balance Static Sitting - Balance Support: No upper extremity supported;Feet supported Static Sitting - Level of Assistance: 5: Stand by assistance Dynamic Sitting Balance Dynamic Sitting - Balance Support: No upper extremity supported;Feet supported Dynamic Sitting - Level of Assistance: 4: Min Insurance risk surveyor Standing - Balance Support: Right upper extremity supported;During functional activity Static Standing - Level of Assistance: 4: Min assist Extremity Assessment  RLE Assessment RLE Assessment: Exceptions to [facility] Passive Range of Motion (PROM) Comments: Ankle limited to 0 deg DF, pt states this is baseline for him 2/2 arthritis General Strength Comments: WFL LLE Assessment LLE Assessment: Exceptions to [facility] Passive Range of Motion (PROM) Comments: Ankle limited to 0 deg DF, pt states this is baseline for him 2/2 arthritis General Strength Comments: 3 to 4-/5 globally   See Function Navigator for Current Functional Status.   Refer to Care Plan for Long Term Goals  Recommendations for other services: None   Discharge Criteria: Patient will be discharged from PT if patient refuses treatment 3 consecutive times without medical reason, if treatment goals not met, if there is a change in medical status, if patient makes no progress towards goals or if patient is discharged from hospital.  The above assessment, treatment plan, treatment alternatives and goals were discussed and mutually agreed upon: by patient  Liliana Dang K Arnette 08/20/2017, 9:17 AM

## 2017-08-20 NOTE — Evaluation (Signed)
Occupational Therapy Assessment and Plan  Patient Details  Name: [name] MRN: [id number] Date of Birth: [birth year]  OT Diagnosis: abnormal posture, cognitive deficits, hemiplegia affecting non-dominant side and coordination disorder Rehab Potential: Rehab Potential (ACUTE ONLY): Good ELOS: 14-17 days   Today's Date: 08/20/2017 OT Individual Time: 1638-4665 OT Individual Time Calculation (min): 76 min     Problem List:  Patient Active Problem List   Diagnosis Date Noted  . 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 Acute brainstem infarction ([place])   . Generalized OA   . Gastritis and gastroduodenitis   . History of GI bleed   . Chronic obstructive pulmonary disease ([place])   . AKI (acute kidney injury) ([place])   . Stage 3 chronic kidney disease ([place])   . Perianal abscess   . CVA (cerebral vascular accident) ([place]) 08/14/2017  . Renal failure (ARF), acute on chronic (HCC) 08/13/2017  . Thoracic aortic aneurysm without rupture ([place]) 12/04/2016  . Essential hypertension 12/04/2016    Past Medical History:  Past Medical History:  Diagnosis Date  . Aortic stenosis   . CAD (coronary artery disease)   . CKD (chronic kidney disease) stage 4, GFR 15-29 ml/min (HCC)   . Hereditary and idiopathic peripheral neuropathy   . HTN (hypertension)   . Pulmonary nodules    Past Surgical History:  Past Surgical History:  Procedure Laterality Date  . CARDIAC CATHETERIZATION N/A 08/04/2015   Procedure: Right/Left Heart Cath and Coronary Angiography;  Surgeon: Yolonda Kida, MD;  Location: [facility];  Service: Cardiovascular;  Laterality: N/A;    Assessment & Plan Clinical Impression: Patient is a 82 y.o. year old male with history of hypertension, CKD stage IV, GI bleed, COPD, CAD-not surgical candidate, peripheral neuropathy; who was admitted on 08/13/2017 with dizziness, left-sided numbness tingling and weakness.  History taken from chart review, family and  patient. CT of head reviewed, unremarkable for acute intracranial process. He was found to have acute on chronic renal failure and started on IV fluids for hydration.  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 He had developed left-sided weakness on 7/31, and repeat MRI brain done revealing evolving acute right medullary infarct without associated hemorrhage and partially thrombosed large fusiform aneurysm to be stable.  Carotid Dopplers showed no significant ICA stenosis. 2 D echo showed EF of 60 to 65%, no wall abnormality.  Dr. Doy Mince recommended aspirin 325 mg daily as well as follow-up with interventional radiology to 3 weeks post discharge.  Patient transferred to CIR on 08/19/2017 .    Patient currently requires max with basic self-care skills secondary to muscle weakness, decreased cardiorespiratoy endurance, decreased coordination and decreased motor planning, decreased attention, decreased awareness, decreased problem solving, decreased safety awareness and delayed processing and decreased sitting balance, decreased standing balance, decreased postural control, hemiplegia and decreased balance strategies.  Prior to hospitalization, patient could complete ADLs with modified independent .  Patient will benefit from skilled intervention to decrease level of assist with basic self-care skills prior to discharge home with care partner.  Anticipate patient will require 24 hour supervision and minimal physical assistance and follow up home health.  OT - End of Session Activity Tolerance: Decreased this session Endurance Deficit: Yes Endurance Deficit Description: multiple rest breaks this session secondary to fatigue OT Assessment Rehab Potential (ACUTE ONLY): Good  OT Barriers to Discharge: Decreased caregiver support;Other (comments) OT Barriers  to Discharge Comments: pt is caregiver for wife at home OT Patient demonstrates impairments in the following area(s): Balance;Cognition;Endurance;Perception OT Basic ADL's Functional Problem(s): Eating;Grooming;Bathing;Dressing;Toileting OT Transfers Functional Problem(s): Toilet;Tub/Shower OT Additional Impairment(s): Fuctional Use of Upper Extremity OT Plan OT Intensity: Minimum of 1-2 x/day, 45 to 90 minutes OT Frequency: 5 out of 7 days OT Duration/Estimated Length of Stay: 14-17 days OT Treatment/Interventions: Balance/vestibular training;Self Care/advanced ADL retraining;Therapeutic Exercise;Wheelchair propulsion/positioning;Neuromuscular re-education;Cognitive remediation/compensation;DME/adaptive equipment instruction;UE/LE Strength taining/ROM;Community reintegration;Patient/family education;UE/LE Coordination activities;Discharge planning;Functional mobility training;Psychosocial support;Therapeutic Activities OT Self Feeding Anticipated Outcome(s): set up A OT Basic Self-Care Anticipated Outcome(s): supervision OT Toileting Anticipated Outcome(s): supervision OT Bathroom Transfers Anticipated Outcome(s): supervision- toilet, CGA - shower OT Recommendation Recommendations for Other Services: Other (comment)(none at this time) Patient destination: Home Follow Up Recommendations: Home health OT;24 hour supervision/assistance Equipment Recommended: To be determined   Skilled Therapeutic Intervention Upon entering the room, pt supine in bed with no c/o pain this session. Pt with increasing c/o fatigue from prior therapy evaluation. OT reviewed pt's schedule and inpatient rehab expectations, OT purpose, POC, and goals with pt verbalizing understanding. Pt agreeable. Pt required maximal encouragement for participation during evaluations. Pt standing from bed with mod A and stand pivot transfer with max A and L knee blocked. Pt's knee buckling during transfer into wheelchair. Pt seated in  wheelchair for grooming tasks with set up A and encouragement. Pt continues to report increased fatigue and returns back to bed at end of session in same manner. Call bell within reach and bed alarm activated for safety.   OT Evaluation Precautions/Restrictions  Precautions Precautions: Fall Restrictions Weight Bearing Restrictions: No Vital Signs Oxygen Therapy SpO2: 96 % O2 Device: Room Air Pain Pain Assessment Pain Scale: 0-10 Pain Score: 0-No pain Home Living/Prior Functioning Home Living Family/patient expects to be discharged to:: Private residence Living Arrangements: Spouse/significant other Available Help at Discharge: Family Type of Home: House Home Access: Stairs to enter Technical brewer of Steps: 2 Entrance Stairs-Rails: Left Home Layout: One level Bathroom Shower/Tub: Tub/shower unit, Charity fundraiser: Handicapped height Bathroom Accessibility: Yes  Lives With: Spouse Prior Function Level of Independence: Independent with basic ADLs, Independent with transfers, Independent with homemaking with ambulation, Independent with gait  Able to Take Stairs?: Yes Driving: Yes Vocation: On disability Vocation Requirements: disability for cardiac issues Leisure: Hobbies-yes (Comment) Comments: enjoys playing the banjo, going to rodeo, etc Vision Baseline Vision/History: Wears glasses Wears Glasses: At all times Patient Visual Report: No change from baseline Vision Assessment?: Yes Eye Alignment: Within Functional Limits Ocular Range of Motion: Within Functional Limits Alignment/Gaze Preference: Within Defined Limits Tracking/Visual Pursuits: Able to track stimulus in all quads without difficulty Saccades: Overshoots Visual Fields: No apparent deficits Perception  Perception: Within Functional Limits Praxis Praxis: Intact Cognition Overall Cognitive Status: No family/caregiver present to determine baseline cognitive functioning Arousal/Alertness:  Awake/alert Sensation Sensation Light Touch: Impaired by gross assessment(diminished sensation globally LLE, tingling w/ activity ) Coordination Gross Motor Movements are Fluid and Coordinated: No Fine Motor Movements are Fluid and Coordinated: No Heel Shin Test: impaired LLE Motor  Motor Motor: Hemiplegia Motor - Skilled Clinical Observations: L hemi, generalized weakness Mobility  Bed Mobility Bed Mobility: Rolling Right;Rolling Left;Supine to Sit;Sit to Supine Rolling Right: Supervision/verbal cueing Rolling Left: Supervision/Verbal cueing Supine to Sit: Supervision/Verbal cueing Sit to Supine: Supervision/Verbal cueing Transfers Sit to Stand: Minimal Assistance - Patient > 75% Stand to Sit: Minimal Assistance - Patient > 75%  Trunk/Postural Assessment  Cervical Assessment Cervical Assessment: Within Functional Limits Thoracic Assessment Thoracic Assessment: Within Functional Limits Lumbar Assessment Lumbar Assessment: Within Functional Limits Postural Control Postural Control: Deficits on evaluation(posterior lean bias during mobility, able to correct w/ max cues) Righting Reactions: delayed  Balance Balance Balance Assessed: Yes Static Sitting Balance Static Sitting - Balance Support: No upper extremity supported;Feet supported Static Sitting - Level of Assistance: 5: Stand by assistance Dynamic Sitting Balance Dynamic Sitting - Balance Support: No upper extremity supported;Feet supported Dynamic Sitting - Level of Assistance: 4: Min Insurance risk surveyor Standing - Balance Support: Right upper extremity supported;During functional activity Static Standing - Level of Assistance: 4: Min assist Extremity/Trunk Assessment RUE Assessment RUE Assessment: Within Functional Limits LUE Assessment LUE Assessment: Exceptions to [facility] General Strength Comments: 2-/5 shoulder, 3-/5 elbow, wrist, and digits   See Function Navigator for Current Functional  Status.   Refer to Care Plan for Long Term Goals  Recommendations for other services: None    Discharge Criteria: Patient will be discharged from OT if patient refuses treatment 3 consecutive times without medical reason, if treatment goals not met, if there is a change in medical status, if patient makes no progress towards goals or if patient is discharged from hospital.  The above assessment, treatment plan, treatment alternatives and goals were discussed and mutually agreed upon: by patient  Gypsy Decant 08/20/2017, 12:17 PM

## 2017-08-20 NOTE — Evaluation (Signed)
Speech Language Pathology Assessment and Plan  Patient Details  Name: [name] MRN: [id number] Date of Birth: 12/08/1935  SLP Diagnosis: Cognitive Impairments;Dysphagia  Rehab Potential: Good ELOS: 14 days     Today's Date: 08/20/2017 SLP Individual Time: 1300-1400 SLP Individual Time Calculation (min): 60 min   Problem List:  Patient Active Problem List   Diagnosis Date Noted  . 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 Acute brainstem infarction ([place])   . Generalized OA   . Gastritis and gastroduodenitis   . History of GI bleed   . Chronic obstructive pulmonary disease ([place])   . AKI (acute kidney injury) ([place])   . Stage 3 chronic kidney disease ([place])   . Perianal abscess   . CVA (cerebral vascular accident) ([place]) 08/14/2017  . Renal failure (ARF), acute on chronic (HCC) 08/13/2017  . Thoracic aortic aneurysm without rupture ([place]) 12/04/2016  . Essential hypertension 12/04/2016   Past Medical History:  Past Medical History:  Diagnosis Date  . Aortic stenosis   . CAD (coronary artery disease)   . CKD (chronic kidney disease) stage 4, GFR 15-29 ml/min (HCC)   . Hereditary and idiopathic peripheral neuropathy   . HTN (hypertension)   . Pulmonary nodules    Past Surgical History:  Past Surgical History:  Procedure Laterality Date  . CARDIAC CATHETERIZATION N/A 08/04/2015   Procedure: Right/Left Heart Cath and Coronary Angiography;  Surgeon: Yolonda Kida, MD;  Location: [facility];  Service: Cardiovascular;  Laterality: N/A;    Assessment / Plan / Recommendation Clinical Impression [name] is a 82 y.o. year old white male with hypertension, osteoarthritis, gastritis, gastrointestinal bleed secondary to NSAIDs, chronic obstructive pulmonary disease, admitted to [facility] on 08/13/2017 for acute CVA.  Imaging revealed an acute right medullary infarct.  CKD stage IV-creatinine is stable and will require close follow up.  Also monitoring  his small peri-anal abscess with start of Augmentin.  Stroke work up complete and therapies recommending CIR for post acute therapy.  Patient admitted to IP Rehab 08/19/17.   Pt presents with mild cognitive impairments, deficits include higher level problem solving skills, error awareness, and safety awareness, which was further supported by Cognistat, cognitive linguistic assessment indicating mild impairments in the areas listed above. Family was not present to confirm baseline cognitive/speech skills and pt is slightly HOH impacting auditory comprehension of higher level skills. Pt demonstrated slurred speech in conversations with min-supervision A question cues for error awareness/correction, although pt claims speech is at baseline. Pt presents with functional swallow of regular textured foods and thin liquids via straw, requiring only tray set up.Pt would benefit from skilled ST services in order to maximize functional independence and reduce burden of care requiring 24/hours supervision for physical needs, with likely no follow up ST services upon returning home.    Skilled Therapeutic Interventions          Skilled ST services focused on cognitive skills. SLP completed cognitive linguistic assessment litsed above and provided explanation of result to pt. SLP facilitated mildly complex problem solving utilzing medication management of current medication, pt required min-supervision A verbal cues for problem solving and error correct.. Pt was left in room with call bell within reach and bed alaram set.ST reccomends to continue skilled ST services.   SLP Assessment  Patient will need skilled Speech Lanaguage Pathology Services during CIR admission    Recommendations  SLP Diet Recommendations: Thin Liquid Administration via: Straw;Cup Medication Administration:  Whole meds with liquid Supervision: Patient able to self feed Compensations: Minimize environmental distractions;Slow rate;Small  sips/bites Postural Changes and/or Swallow Maneuvers: Seated upright 90 degrees Oral Care Recommendations: Oral care BID;Patient independent with oral care Patient destination: Home Follow up Recommendations: None;24 hour supervision/assistance(from a phsyical standpoint) Equipment Recommended: None recommended by SLP    SLP Frequency 3 to 5 out of 7 days   SLP Duration  SLP Intensity  SLP Treatment/Interventions 14 days   Minumum of 1-2 x/day, 30 to 90 minutes  Cognitive remediation/compensation;Cueing hierarchy;Functional tasks;Medication managment    Pain Pain Assessment Pain Scale: 0-10 Pain Score: 0-No pain  Prior Functioning Cognitive/Linguistic Baseline: Within functional limits(family not present to confirm baseline) Type of Home: House  Lives With: Spouse Available Help at Discharge: Family Vocation: Retired  Function:  Eating Eating   Modified Consistency Diet: No Eating Assist Level: Set up assist for   Eating Set Up Assist For: Opening containers       Cognition Comprehension Comprehension assist level: Understands basic 90% of the time/cues < 10% of the time(slightly HOH )  Expression   Expression assist level: Expresses basic 90% of the time/requires cueing < 10% of the time.;Expresses basic 75 - 89% of the time/requires cueing 10 - 24% of the time. Needs helper to occlude trach/needs to repeat words.  Social Interaction Social Interaction assist level: Interacts appropriately 90% of the time - Needs monitoring or encouragement for participation or interaction.  Problem Solving Problem solving assist level: Solves basic 90% of the time/requires cueing < 10% of the time;Solves basic 75 - 89% of the time/requires cueing 10 - 24% of the time  Memory Memory assist level: More than reasonable amount of time   Short Term Goals: Week 1: SLP Short Term Goal 1 (Week 1): Pt will demonstrate fucntional problem solving for basic and familiar (mildly complex) tasks  with supervision A verbal and question cues. SLP Short Term Goal 2 (Week 1): Pt will self-monitor and self-correct verbal and functional errors with supervision A verbal and question cues. SLP Short Term Goal 3 (Week 1): Pt will demonstrate two safety precautions during functional tasks with supervision A verbal and question cues. SLP Short Term Goal 4 (Week 1): Pt will utilizde speech intelligibility strategies in conversation with supervision A verbal/question cues with 90% intelligibility.  Refer to Care Plan for Long Term Goals  Recommendations for other services: None   Discharge Criteria: Patient will be discharged from SLP if patient refuses treatment 3 consecutive times without medical reason, if treatment goals not met, if there is a change in medical status, if patient makes no progress towards goals or if patient is discharged from hospital.  The above assessment, treatment plan, treatment alternatives and goals were discussed and mutually agreed upon: by patient  Chrisy Hillebrand  [facility] 08/20/2017, 2:47 PM

## 2017-08-20 NOTE — Plan of Care (Signed)
  Problem: RH BOWEL ELIMINATION Goal: RH STG MANAGE BOWEL WITH ASSISTANCE Description STG Manage Bowel with Assistance. Min  Outcome: Progressing   Problem: RH BLADDER ELIMINATION Goal: RH STG MANAGE BLADDER WITH ASSISTANCE Description STG Manage Bladder With Assistance. Min  Outcome: Progressing   Problem: RH SKIN INTEGRITY Goal: RH STG MAINTAIN SKIN INTEGRITY WITH ASSISTANCE Description STG Maintain Skin Integrity With Assistance. No breakdown or infection while on rehab with min assist  Outcome: Progressing   Problem: RH SAFETY Goal: RH STG ADHERE TO SAFETY PRECAUTIONS W/ASSISTANCE/DEVICE Description STG Adhere to Safety Precautions With Assistance/Device. Min  Outcome: Progressing   Problem: RH KNOWLEDGE DEFICIT Goal: RH STG INCREASE KNOWLEDGE OF HYPERTENSION Description Patient and family will be able to describe medications to control HTN and describe target blood pressure parameters with cues.  Outcome: Progressing

## 2017-08-20 NOTE — Progress Notes (Signed)
Subjective/Complaints:   Objective: Vital Signs: Blood pressure (!) 119/94, pulse 62, temperature 98.1 F (36.7 C), temperature source Oral, resp. rate 18, height '5\' 9"'  (1.753 m), weight 93.1 kg (205 lb 4 oz), SpO2 95 %. Dg Chest 2 View  Result Date: 08/19/2017 CLINICAL DATA:  Wheezing. EXAM: CHEST - 2 VIEW COMPARISON:  Radiograph of August 13, 2017. FINDINGS: The heart size and mediastinal contours are within normal limits. Both lungs are clear. The visualized skeletal structures are unremarkable. IMPRESSION: No active cardiopulmonary disease. Electronically Signed   By: Marijo Conception, M.D.   On: 08/19/2017 17:02   Results for orders placed or performed during the hospital encounter of 08/19/17 (from the past 72 hour(s))  Creatinine, serum     Status: Abnormal   Collection Time: 08/19/17  4:17 PM  Result Value Ref Range   Creatinine, Ser 3.43 (H) 0.61 - 1.24 mg/dL   GFR calc non Af Amer 16 (L) >60 mL/min   GFR calc Af Amer 18 (L) >60 mL/min    Comment: (NOTE) The eGFR has been calculated using the CKD EPI equation. This calculation has not been validated in all clinical situations. eGFR's persistently <60 mL/min signify possible Chronic Kidney Disease. Performed at [facility], [place] [address] [place], [place] [address]   CBC WITH DIFFERENTIAL     Status: Abnormal   Collection Time: 08/20/17  6:01 AM  Result Value Ref Range   WBC 6.1 4.0 - 10.5 K/uL   RBC 3.89 (L) 4.22 - 5.81 MIL/uL   Hemoglobin 10.6 (L) 13.0 - 17.0 g/dL   HCT 34.4 (L) 39.0 - 52.0 %   MCV 88.4 78.0 - 100.0 fL   MCH 27.2 26.0 - 34.0 pg   MCHC 30.8 30.0 - 36.0 g/dL   RDW 15.8 (H) 11.5 - 15.5 %   Platelets 165 150 - 400 K/uL   Neutrophils Relative % 66 %   Neutro Abs 4.1 1.7 - 7.7 K/uL   Lymphocytes Relative 16 %   Lymphs Abs 1.0 0.7 - 4.0 K/uL   Monocytes Relative 11 %   Monocytes Absolute 0.7 0.1 - 1.0 K/uL   Eosinophils Relative 5 %   Eosinophils Absolute 0.3 0.0 - 0.7 K/uL   Basophils Relative  1 %   Basophils Absolute 0.0 0.0 - 0.1 K/uL   Immature Granulocytes 1 %   Abs Immature Granulocytes 0.1 0.0 - 0.1 K/uL    Comment: Performed at [facility], [address], [place], [place] [address]  Comprehensive metabolic panel     Status: Abnormal   Collection Time: 08/20/17  6:01 AM  Result Value Ref Range   Sodium 139 135 - 145 mmol/L   Potassium 5.1 3.5 - 5.1 mmol/L   Chloride 108 98 - 111 mmol/L   CO2 22 22 - 32 mmol/L   Glucose, Bld 95 70 - 99 mg/dL   BUN 41 (H) 8 - 23 mg/dL   Creatinine, Ser 3.25 (H) 0.61 - 1.24 mg/dL   Calcium 8.7 (L) 8.9 - 10.3 mg/dL   Total Protein 6.1 (L) 6.5 - 8.1 g/dL   Albumin 3.1 (L) 3.5 - 5.0 g/dL   AST 18 15 - 41 U/L   ALT 21 0 - 44 U/L   Alkaline Phosphatase 90 38 - 126 U/L   Total Bilirubin 0.8 0.3 - 1.2 mg/dL   GFR calc non Af Amer 17 (L) >60 mL/min   GFR calc Af Amer 19 (L) >60 mL/min  Comment: (NOTE) The eGFR has been calculated using the CKD EPI equation. This calculation has not been validated in all clinical situations. eGFR's persistently <60 mL/min signify possible Chronic Kidney Disease.    Anion gap 9 5 - 15    Comment: Performed at [facility] [address], [place], [place] [address]     HEENT: blepharitis Right eyelid Cardio: RRR and no murmur Resp: CTA B/L and unlabored GI: BS positive and NT, ND Extremity:  No Edema Skin:   Other anal area not visualized pt dressed in PT gym Neuro: Alert/Oriented, Abnormal Sensory reduced Left left Rigth 5th digit UE, Abnormal Motor 3- Left delt ,bi, tri grip, 4+ L HF, KE, 1/5 L ADF, Abnormal FMC Ataxic/ dec FMC and Other atrophy RIght hand intrinsics Musc/Skel:  Normal and Other no pain with UE or LE ROM, Neg tinel at R elbow Gen NAD   Assessment/Plan: 1. 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 Physiatrist is providing close team supervision and  24 hour management of active medical problems listed below. Physiatrist and rehab team continue to assess barriers to discharge/monitor patient progress toward functional and medical goals. FIM:       Function - Toileting Toileting steps completed by helper: Adjust clothing prior to toileting, Performs perineal hygiene, Adjust clothing after toileting Assist level: Touching or steadying assistance (Pt.75%)           Function - Comprehension Comprehension: Auditory Comprehension assist level: Understands basic 90% of the time/cues < 10% of the time  Function - Expression Expression: Verbal Expression assist level: Expresses basic 90% of the time/requires cueing < 10% of the time.  Function - Social Interaction Social Interaction assist level: Interacts appropriately 90% of the time - Needs monitoring or encouragement for participation or interaction.  Function - Problem Solving Problem solving assist level: Solves basic 90% of the time/requires cueing < 10% of the time  Function - Memory Memory assist level: Recognizes or recalls 90% of the time/requires cueing < 10% of the time Patient normally able to recall (first 3 days only): Current season  Medical Problem List and Plan: 1.  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 CIR evals today 2.  DVT Prophylaxis/Anticoagulation: Pharmaceutical: Lovenox 3. Idiopathic neuropathy/Pain Management: Gabapentin was added for LUE dysesthesias. Continue ultram prn as at home.  4. Mood: LCSW to follow for evaluation and support.  5. Neuropsych: This patient is capable of making decisions on his own behalf. 6. Skin/Wound Care: routine pressure relief measures.  7. Fluids/Electrolytes/Nutrition: Monitor I/O. Check lytes in am.  8. COPD: Reports increase in Wheezing/SOB due to URI? Will check CXR. increase albuterol to every 4 hours prn. Continue Onoro.  Encourage IS to help with atelectasis 9.  Acute on chronic renal failure--stage IV:  Baseline SCr-3.65. Renal status improving. On calcitriol for secondary hyperparathyroidism. Continue to monitor with serial checks. Avoid ARB/ No ACE due to allergy.  Recheck creat 3.25 10. H/o gastritis due to NSAIDs/ GERD: Continue PPI.  11. Dyslipidemia: on Lipitor.  12. CAD/AS: Treated medically with metoprolol, ASA and lipitor.  13. Peri anal cyst:  With acute on chronic inflammation. No prior treatment per reports. Started on Augmentin on 8/3 for  7-10 days treatment. Continue moist warm compresses to help drain.    14. Anemia of chronic disease: Hgb- 11.2-12.5 range. Continue to monitor.  15. Constipation: will schedule Senna S 2 at bedtime. 16.  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 Multifocal neuropathy   LOS (Days) 1 A FACE TO FACE EVALUATION WAS PERFORMED  Charlett Blake 08/20/2017, 8:45 AM

## 2017-08-21 ENCOUNTER — Inpatient Hospital Stay (HOSPITAL_COMMUNITY): Payer: Medicare Other

## 2017-08-21 ENCOUNTER — Inpatient Hospital Stay (HOSPITAL_COMMUNITY): Payer: Self-pay

## 2017-08-21 LAB — GLUCOSE, CAPILLARY
GLUCOSE-CAPILLARY: 90 mg/dL (ref 70–99)
GLUCOSE-CAPILLARY: 97 mg/dL (ref 70–99)
Glucose-Capillary: 108 mg/dL — ABNORMAL HIGH (ref 70–99)

## 2017-08-21 MED ORDER — LORAZEPAM 0.5 MG PO TABS
0.5000 mg | ORAL_TABLET | Freq: Once | ORAL | Status: AC
  Administered 2017-08-21: 0.5 mg via ORAL
  Filled 2017-08-21: qty 1

## 2017-08-21 MED ORDER — TAMSULOSIN HCL 0.4 MG PO CAPS
0.4000 mg | ORAL_CAPSULE | Freq: Every day | ORAL | Status: DC
  Administered 2017-08-22 – 2017-09-06 (×15): 0.4 mg via ORAL
  Filled 2017-08-21 (×16): qty 1

## 2017-08-21 MED ORDER — LORATADINE 10 MG PO TABS
10.0000 mg | ORAL_TABLET | Freq: Every day | ORAL | Status: DC
  Administered 2017-08-21 – 2017-09-07 (×18): 10 mg via ORAL
  Filled 2017-08-21 (×18): qty 1

## 2017-08-21 MED ORDER — CLOPIDOGREL BISULFATE 75 MG PO TABS
300.0000 mg | ORAL_TABLET | ORAL | Status: AC
  Administered 2017-08-21: 300 mg via ORAL
  Filled 2017-08-21: qty 4

## 2017-08-21 MED ORDER — CLOPIDOGREL BISULFATE 75 MG PO TABS
75.0000 mg | ORAL_TABLET | Freq: Every day | ORAL | Status: DC
  Administered 2017-08-22 – 2017-09-07 (×17): 75 mg via ORAL
  Filled 2017-08-21 (×17): qty 1

## 2017-08-21 MED ORDER — GABAPENTIN 100 MG PO CAPS
100.0000 mg | ORAL_CAPSULE | Freq: Two times a day (BID) | ORAL | Status: DC
  Administered 2017-08-21 – 2017-09-07 (×34): 100 mg via ORAL
  Filled 2017-08-21 (×34): qty 1

## 2017-08-21 MED ORDER — ASPIRIN EC 81 MG PO TBEC
81.0000 mg | DELAYED_RELEASE_TABLET | Freq: Every day | ORAL | Status: DC
  Administered 2017-08-22 – 2017-09-07 (×17): 81 mg via ORAL
  Filled 2017-08-21 (×17): qty 1

## 2017-08-21 MED ORDER — METOPROLOL TARTRATE 25 MG PO TABS
25.0000 mg | ORAL_TABLET | Freq: Two times a day (BID) | ORAL | Status: DC
  Administered 2017-08-22 – 2017-08-26 (×9): 25 mg via ORAL
  Filled 2017-08-21 (×10): qty 1

## 2017-08-21 NOTE — Progress Notes (Signed)
Orthopedic Tech Progress Note Patient Details:  [name] 03-Jan-1936 [id number]  Ortho Devices Type of Ortho Device: Abdominal binder Ortho Device/Splint Location: delivered to rn Ortho Device/Splint Interventions: Renard Matter 08/21/2017, 7:17 PM

## 2017-08-21 NOTE — Progress Notes (Signed)
Physical Therapy Session Note  Patient Details  Name: [name] MRN: [id number] Date of Birth: 03/13/1935  Today's Date: 08/21/2017 PT Individual Time: 6203-5597 PT Individual Time Calculation (min): 58 min  Missed Time: 75 minutes (CT scan)   Short Term Goals: Week 1:  PT Short Term Goal 1 (Week 1): Pt will transfer bed<>chair w/ min assist.  PT Short Term Goal 2 (Week 1): Pt will ambulate 106' w/ mod assist w/ LRAD. PT Short Term Goal 3 (Week 1): Pt will demonstrate neutral midline orientation during all functional mobility 50% of the time. PT Short Term Goal 4 (Week 1): Pt will demonstrate appropriate safety awareness during functional mobility 50% of the time.  Skilled Therapeutic Interventions/Progress Updates:    Session 1: Pt seated in w/c upon PT arrival, agreeable to therapy tx and denies pain. Pt transported to the gym. Pt performed squat pivot from w/c>mat with min assist. Pt performed sit<>stands from mat this session without UE support mod assist. In standing pt worked on pre-gait without AD, therapist blocking L knee and providing tactile cues for L knee extension during stance while stepping with R LE 2 x 5 steps, x 3 steps with L LE in place, mod-max assist for postural control and standing balance. Pt performed x 5 sit<>stands from edge of mat without UE support, mod assist, manual facilitation for increased L LE weightbearing and tactile cues for anterior weightshift. Pt worked on postural control in sitting and L lateral weightshifting to reach for horseshoes and toss. Pt worked on postural control in standing without UE support in order to reach overhead for horseshoes and toss, x 2 trials with visual feedback using mirror to ensure neutral pelvic and trunk rotation. Pt worked on pre-gait with RW stepping in place with each LE x 10, mod assist and blocking L LE during stance/providing verbal cues for knee extension, manual facilitation for L hip extension and verbal cues for  trunk extension. Pt performed squat pivot transfer back to w/c with mod assist, verbal cues for techniques and manual facilitation for weightshift. Pt transported back to room and left with needs in reach, chair alarm set and family present.   Session 2: Pt missed 75 minutes of skilled theraputic tx secondary to medical hold/off unit for CT scan.  Therapy Documentation Precautions:  Precautions Precautions: Fall Restrictions Weight Bearing Restrictions: No   See Function Navigator for Current Functional Status.   Therapy/Group: Individual Therapy  Netta Corrigan, PT, DPT 08/21/2017, 7:53 AM

## 2017-08-21 NOTE — Code Documentation (Signed)
82 yo Male coming from [address] with complaints of sudden onset of left sided arm and leg weakness with left facial droop. Pt was walking with PT for Rehab when he noticed the weakness on the left side after he was placed back in the bed. Nurse was notified and activated a Code Stroke. Stroke team arrived and evaluated patient. Initial NIHSS 6 due to left arm and left leg weakness, left facial droop, and decrease sensory on the left side. Pt was taken to CT. CT Negative. BP taken in CT 110/73. Alert and Oriented x4. No acute treatment at this time. No tPA because patient has hx of stroke from two weeks ago. Pt is not LVO+ no IR intervention needed. After scan, pt started to resolve left arm and leg weakness decreased. No drift noted on either limb. Facial droop and sensory decreased was still present. Hx stated that facial droop was baseline per past stroke. Handoff given to [facility]. No transfer needed.

## 2017-08-21 NOTE — Consult Note (Addendum)
Neurology Consultation  Reason for Consult: Code stroke Referring Physician: Letta Pate  CC: Increased left arm and leg weakness  History is obtained from: Nurse and physical therapist  HPI: [name] is a 82 y.o. male who had a recent stroke 08/14/2017 which included left nasolabial fold decrease along with left upper and lower extremity weakness with arm greater than leg.  Patient eventually was transferred to CIR on 08/19/2017.  Per physical therapist he was doing good and able to lift his leg with good strength and the arm he was able to lift antigravity with good strength.  Patient had a physical therapy today at approximately 1400.  He is brought back to his room and was sitting in his chair until 1340 when he noted something was abnormal with his leg and arm-felt weaker.  He told his family who came out and told the nurse.  Code stroke was called and upon entering the room patient still had a left nasolabial fold droop, was able to lift his arm off the bed and unable to lift his leg off the bed.  Blood pressure at that time was 601 systolically however prior to that it was 093 systolically but he runs in the 130s.  Patient was brought down to CT immediately.  CT scan was obtained showing no acute bleed.  Upon it moving him from CT bed to his hospital bed he was noted to have 4/5 strength in his left leg and 4/5 strength in his left arm and resolved.   LKW: 1440 tpa given?: no, stroke Premorbid modified Rankin scale (mRS): 1 NIHSS 6  VAN 0  ROS: A 14 point ROS was performed and is negative except as noted in the HPI.    Past Medical History:  Diagnosis Date  . Aortic stenosis   . CAD (coronary artery disease)   . CKD (chronic kidney disease) stage 4, GFR 15-29 ml/min (HCC)   . Hereditary and idiopathic peripheral neuropathy   . HTN (hypertension)   . Pulmonary nodules      Family History  Problem Relation Age of Onset  . Hypertension Mother   . Heart attack Father       Social History:   reports that he has never smoked. He has never used smokeless tobacco. He reports that he does not drink alcohol or use drugs.  Medications  Current Facility-Administered Medications:  .  acetaminophen (TYLENOL) tablet 325-650 mg, 325-650 mg, Oral, Q4H PRN, Love, Pamela S, PA-C .  albuterol (PROVENTIL) (2.5 MG/3ML) 0.083% nebulizer solution 3 mL, 3 mL, Inhalation, Q4H PRN, Love, Pamela S, PA-C, 3 mL at 08/21/17 1043 .  alum & mag hydroxide-simeth (MAALOX/MYLANTA) 200-200-20 MG/5ML suspension 30 mL, 30 mL, Oral, Q4H PRN, Love, Pamela S, PA-C .  amoxicillin-clavulanate (AUGMENTIN) 500-125 MG per tablet 500 mg, 500 mg, Oral, Q12H, Love, Pamela S, PA-C, 500 mg at 08/21/17 0818 .  aspirin EC tablet 325 mg, 325 mg, Oral, Daily, Love, Pamela S, PA-C, 325 mg at 08/21/17 0820 .  atorvastatin (LIPITOR) tablet 40 mg, 40 mg, Oral, q1800, Love, Pamela S, PA-C, 40 mg at 08/20/17 1753 .  bisacodyl (DULCOLAX) suppository 10 mg, 10 mg, Rectal, Daily PRN, Love, Pamela S, PA-C .  calcitRIOL (ROCALTROL) capsule 0.25 mcg, 0.25 mcg, Oral, Q M,W,F, Love, Pamela S, PA-C, 0.25 mcg at 08/21/17 0817 .  citalopram (CELEXA) tablet 30 mg, 30 mg, Oral, Daily, Love, Pamela S, PA-C, 30 mg at 08/21/17 0819 .  diphenhydrAMINE (BENADRYL) 12.5 MG/5ML elixir 12.5-25 mg,  12.5-25 mg, Oral, Q6H PRN, Love, Pamela S, PA-C .  enoxaparin (LOVENOX) injection 30 mg, 30 mg, Subcutaneous, Q24H, Love, Pamela S, PA-C, 30 mg at 08/20/17 2059 .  feeding supplement (PRO-STAT SUGAR FREE 64) liquid 30 mL, 30 mL, Oral, BID, Love, Pamela S, PA-C, 30 mL at 08/21/17 0817 .  gabapentin (NEURONTIN) capsule 200 mg, 200 mg, Oral, BID, Love, Pamela S, PA-C, 200 mg at 08/21/17 0820 .  guaiFENesin-dextromethorphan (ROBITUSSIN DM) 100-10 MG/5ML syrup 5-10 mL, 5-10 mL, Oral, Q6H PRN, Love, Pamela S, PA-C .  loratadine (CLARITIN) tablet 10 mg, 10 mg, Oral, Daily, Kirsteins, Luanna Salk, MD, 10 mg at 08/21/17 0955 .  metoprolol tartrate  (LOPRESSOR) tablet 25 mg, 25 mg, Oral, BID, Love, Pamela S, PA-C, 25 mg at 08/21/17 7616 .  multivitamin (RENA-VIT) tablet 1 tablet, 1 tablet, Oral, QHS, Love, Pamela S, PA-C, 1 tablet at 08/20/17 2101 .  MUSCLE RUB CREA, , Topical, PRN, Kirsteins, Luanna Salk, MD .  pantoprazole (PROTONIX) EC tablet 40 mg, 40 mg, Oral, QAC breakfast, Love, Pamela S, PA-C, 40 mg at 08/21/17 0616 .  polyethylene glycol (MIRALAX / GLYCOLAX) packet 17 g, 17 g, Oral, Daily PRN, Love, Pamela S, PA-C .  prochlorperazine (COMPAZINE) tablet 5-10 mg, 5-10 mg, Oral, Q6H PRN **OR** prochlorperazine (COMPAZINE) injection 5-10 mg, 5-10 mg, Intramuscular, Q6H PRN **OR** prochlorperazine (COMPAZINE) suppository 12.5 mg, 12.5 mg, Rectal, Q6H PRN, Love, Pamela S, PA-C .  senna-docusate (Senokot-S) tablet 2 tablet, 2 tablet, Oral, QHS, Love, Pamela S, PA-C, 2 tablet at 08/20/17 2101 .  sodium phosphate (FLEET) 7-19 GM/118ML enema 1 enema, 1 enema, Rectal, Once PRN, Love, Pamela S, PA-C .  tamsulosin (FLOMAX) capsule 0.4 mg, 0.4 mg, Oral, QPC breakfast, Love, Pamela S, PA-C, 0.4 mg at 08/21/17 0737 .  traMADol (ULTRAM) tablet 50 mg, 50 mg, Oral, Q6H PRN, Love, Pamela S, PA-C .  traZODone (DESYREL) tablet 25-50 mg, 25-50 mg, Oral, QHS PRN, Love, Pamela S, PA-C .  umeclidinium-vilanterol (ANORO ELLIPTA) 62.5-25 MCG/INH 1 puff, 1 puff, Inhalation, Daily, Love, Pamela S, PA-C, 1 puff at 08/21/17 1062   Exam: Current vital signs: BP (!) 113/57 (BP Location: Right Arm)   Pulse 68   Temp 97.7 F (36.5 C) (Oral)   Resp 17   Ht 5\' 9"  (1.753 m)   Wt 93.1 kg (205 lb 4 oz)   SpO2 96%   BMI 30.31 kg/m  Vital signs in last 24 hours: Temp:  [97.7 F (36.5 C)-98.7 F (37.1 C)] 97.7 F (36.5 C) (08/07 1453) Pulse Rate:  [66-80] 68 (08/07 1453) Resp:  [17-19] 17 (08/07 1440) BP: (104-138)/(57-93) 113/57 (08/07 1453) SpO2:  [95 %-98 %] 96 % (08/07 1453)  GENERAL: Awake, alert in NAD HEENT: - Normocephalic and atraumatic, dry mm, no  LN++, no Thyromegally  Ext: warm, well perfused, intact peripheral pulses,   NEURO:  Mental Status: AA&Ox3, speech is clear.  Naming, repetition, fluency, and comprehension intact. Cranial Nerves: PERRL 85mm/brisk. EOMI, visual fields full, left facial droop from prior stroke, facial sensation intact, hearing intact, tongue Motor: Upon returning to room patient had 5 out of 5 strength on right side with 4/5 strength with left arm and left leg with resolution of symptoms. Tone: is normal and bulk is normal Sensation- Intact to light touch bilaterally Coordination: FTN intact bilaterally, no ataxia in BLE. Gait-   Labs I have reviewed labs in epic and the results pertinent to this consultation are:   CBC    Component Value  Date/Time   WBC 6.1 08/20/2017 0601   RBC 3.89 (L) 08/20/2017 0601   HGB 10.6 (L) 08/20/2017 0601   HGB 12.2 (L) 09/30/2013 1903   HCT 34.4 (L) 08/20/2017 0601   HCT 38.7 (L) 09/30/2013 1903   PLT 165 08/20/2017 0601   PLT 215 09/30/2013 1903   MCV 88.4 08/20/2017 0601   MCV 80 09/30/2013 1903   MCH 27.2 08/20/2017 0601   MCHC 30.8 08/20/2017 0601   RDW 15.8 (H) 08/20/2017 0601   RDW 18.9 (H) 09/30/2013 1903   LYMPHSABS 1.0 08/20/2017 0601   LYMPHSABS 1.6 09/30/2013 1903   MONOABS 0.7 08/20/2017 0601   MONOABS 0.8 09/30/2013 1903   EOSABS 0.3 08/20/2017 0601   EOSABS 0.3 09/30/2013 1903   BASOSABS 0.0 08/20/2017 0601   BASOSABS 0.1 09/30/2013 1903    CMP     Component Value Date/Time   NA 139 08/20/2017 0601   NA 140 09/30/2013 1903   K 5.1 08/20/2017 0601   K 4.8 09/30/2013 1903   CL 108 08/20/2017 0601   CL 116 (H) 09/30/2013 1903   CO2 22 08/20/2017 0601   CO2 20 (L) 09/30/2013 1903   GLUCOSE 95 08/20/2017 0601   GLUCOSE 96 09/30/2013 1903   BUN 41 (H) 08/20/2017 0601   BUN 45 (H) 09/30/2013 1903   CREATININE 3.25 (H) 08/20/2017 0601   CREATININE 2.91 (H) 09/30/2013 1903   CALCIUM 8.7 (L) 08/20/2017 0601   CALCIUM 8.5 09/30/2013 1903    PROT 6.1 (L) 08/20/2017 0601   PROT 6.9 09/30/2013 1903   ALBUMIN 3.1 (L) 08/20/2017 0601   ALBUMIN 3.1 (L) 09/30/2013 1903   AST 18 08/20/2017 0601   AST 13 (L) 09/30/2013 1903   ALT 21 08/20/2017 0601   ALT 18 09/30/2013 1903   ALKPHOS 90 08/20/2017 0601   ALKPHOS 94 09/30/2013 1903   BILITOT 0.8 08/20/2017 0601   BILITOT 0.4 09/30/2013 1903   GFRNONAA 17 (L) 08/20/2017 0601   GFRNONAA 20 (L) 09/30/2013 1903   GFRAA 19 (L) 08/20/2017 0601   GFRAA 23 (L) 09/30/2013 1903    Lipid Panel     Component Value Date/Time   CHOL 157 08/15/2017 0410   CHOL 136 02/08/2011 0441   TRIG 204 (H) 08/15/2017 0410   TRIG 130 02/08/2011 0441   HDL 24 (L) 08/15/2017 0410   HDL 23 (L) 02/08/2011 0441   CHOLHDL 6.5 08/15/2017 0410   VLDL 41 (H) 08/15/2017 0410   VLDL 26 02/08/2011 0441   LDLCALC 92 08/15/2017 0410   LDLCALC 87 02/08/2011 0441     Imaging I have reviewed the images obtained:  CT-scan of the brain--no acute bleed  MRI examination of the brain--pending  Assessment: 82 year old male with previous right medullary infarct at the level of the right medullary pyramid.  Today presenting with code stroke secondary to inability to move his left arm and left leg.  Code stroke called with CT showing no abnormalities.  Though Possibiliy related to low BP(decreased from 130s to 409W), I am not certain of this. With his thrombosed aneurysm, I would favor dual antiplatelet therapy for now.   Recommendations: -MRI brain -MRA brain -Dual antiplatelet therapy for now - Modified stroke NIH scale every 4 hours   Roland Rack, MD [facility] [phone]  If 7pm- 7am, please page neurology on call as listed in [place].

## 2017-08-21 NOTE — Progress Notes (Addendum)
Subjective/Complaints:  Transient dizziness today, resolved, denies double vision or new strength issues Hx of LBP  ROS:  Denies CP SOB N/V/D  Objective: Vital Signs: Blood pressure (!) 138/93, pulse 66, temperature 98 F (36.7 C), temperature source Oral, resp. rate 17, height '5\' 9"'  (1.753 m), weight 93.1 kg (205 lb 4 oz), SpO2 97 %. Dg Chest 2 View  Result Date: 08/19/2017 CLINICAL DATA:  Wheezing. EXAM: CHEST - 2 VIEW COMPARISON:  Radiograph of August 13, 2017. FINDINGS: The heart size and mediastinal contours are within normal limits. Both lungs are clear. The visualized skeletal structures are unremarkable. IMPRESSION: No active cardiopulmonary disease. Electronically Signed   By: Marijo Conception, M.D.   On: 08/19/2017 17:02   Results for orders placed or performed during the hospital encounter of 08/19/17 (from the past 72 hour(s))  Creatinine, serum     Status: Abnormal   Collection Time: 08/19/17  4:17 PM  Result Value Ref Range   Creatinine, Ser 3.43 (H) 0.61 - 1.24 mg/dL   GFR calc non Af Amer 16 (L) >60 mL/min   GFR calc Af Amer 18 (L) >60 mL/min    Comment: (NOTE) The eGFR has been calculated using the CKD EPI equation. This calculation has not been validated in all clinical situations. eGFR's persistently <60 mL/min signify possible Chronic Kidney Disease. Performed at [facility], [place] [address], [place], [place] [address]   CBC WITH DIFFERENTIAL     Status: Abnormal   Collection Time: 08/20/17  6:01 AM  Result Value Ref Range   WBC 6.1 4.0 - 10.5 K/uL   RBC 3.89 (L) 4.22 - 5.81 MIL/uL   Hemoglobin 10.6 (L) 13.0 - 17.0 g/dL   HCT 34.4 (L) 39.0 - 52.0 %   MCV 88.4 78.0 - 100.0 fL   MCH 27.2 26.0 - 34.0 pg   MCHC 30.8 30.0 - 36.0 g/dL   RDW 15.8 (H) 11.5 - 15.5 %   Platelets 165 150 - 400 K/uL   Neutrophils Relative % 66 %   Neutro Abs 4.1 1.7 - 7.7 K/uL   Lymphocytes Relative 16 %   Lymphs Abs 1.0 0.7 - 4.0 K/uL   Monocytes Relative 11 %   Monocytes  Absolute 0.7 0.1 - 1.0 K/uL   Eosinophils Relative 5 %   Eosinophils Absolute 0.3 0.0 - 0.7 K/uL   Basophils Relative 1 %   Basophils Absolute 0.0 0.0 - 0.1 K/uL   Immature Granulocytes 1 %   Abs Immature Granulocytes 0.1 0.0 - 0.1 K/uL    Comment: Performed at [facility], [address] [place], [place] [address]  Comprehensive metabolic panel     Status: Abnormal   Collection Time: 08/20/17  6:01 AM  Result Value Ref Range   Sodium 139 135 - 145 mmol/L   Potassium 5.1 3.5 - 5.1 mmol/L   Chloride 108 98 - 111 mmol/L   CO2 22 22 - 32 mmol/L   Glucose, Bld 95 70 - 99 mg/dL   BUN 41 (H) 8 - 23 mg/dL   Creatinine, Ser 3.25 (H) 0.61 - 1.24 mg/dL   Calcium 8.7 (L) 8.9 - 10.3 mg/dL   Total Protein 6.1 (L) 6.5 - 8.1 g/dL   Albumin 3.1 (L) 3.5 - 5.0 g/dL   AST 18 15 - 41 U/L   ALT 21 0 - 44 U/L   Alkaline Phosphatase 90 38 - 126 U/L   Total Bilirubin 0.8 0.3 - 1.2 mg/dL  GFR calc non Af Amer 17 (L) >60 mL/min   GFR calc Af Amer 19 (L) >60 mL/min    Comment: (NOTE) The eGFR has been calculated using the CKD EPI equation. This calculation has not been validated in all clinical situations. eGFR's persistently <60 mL/min signify possible Chronic Kidney Disease.    Anion gap 9 5 - 15    Comment: Performed at [facility] [address] [place], [place] [address]  Glucose, capillary     Status: None   Collection Time: 08/20/17 12:14 PM  Result Value Ref Range   Glucose-Capillary 74 70 - 99 mg/dL  Glucose, capillary     Status: Abnormal   Collection Time: 08/20/17  5:14 PM  Result Value Ref Range   Glucose-Capillary 103 (H) 70 - 99 mg/dL  Glucose, capillary     Status: Abnormal   Collection Time: 08/20/17  9:38 PM  Result Value Ref Range   Glucose-Capillary 110 (H) 70 - 99 mg/dL   Comment 1 Notify RN   Glucose, capillary     Status: None   Collection Time: 08/21/17  6:27 AM  Result Value Ref Range   Glucose-Capillary 90 70 - 99 mg/dL   Comment 1 Notify RN       HEENT: blepharitis Right eyelid Cardio: RRR and no murmur Resp: CTA B/L and unlabored GI: BS positive and NT, ND Extremity:  No Edema Skin:   Other anal area not visualized pt dressed in PT gym Neuro: Alert/Oriented, Abnormal Sensory reduced   Right 5th digit UE as well as Left little toe Abnormal Motor 3- Left delt ,bi, tri grip, 4+ L HF, KE, 1/5 L ADF, Abnormal FMC Ataxic/ dec FMC and Other atrophy RIght hand intrinsics Musc/Skel:  Normal and Other no pain with UE or LE ROM, Neg tinel at R elbow Gen NAD   Assessment/Plan: 1. 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 Physiatrist is providing close team supervision and 24 hour management of active medical problems listed below. Physiatrist and rehab team continue to assess barriers to discharge/monitor patient progress toward functional and medical goals. FIM: Function - Bathing Bathing activity did not occur: Refused  Function- Upper Body Dressing/Undressing What is the patient wearing?: Hospital gown Assist Level: Touching or steadying assistance(Pt > 75%) Function - Lower Body Dressing/Undressing What is the patient wearing?: Non-skid slipper socks Non-skid slipper socks- Performed by helper: Don/doff right sock, Don/doff left sock Assist for footwear: Dependant  Function - Toileting Toileting steps completed by helper: Adjust clothing prior to toileting, Performs perineal hygiene, Adjust clothing after toileting Assist level: Touching or steadying assistance (Pt.75%)  Function - Toilet Transfers Toilet transfer assistive device: Elevated toilet seat/BSC over toilet Assist level to toilet: Maximal assist (Pt 25 - 49%/lift and lower) Assist level from toilet: Maximal assist (Pt 25 - 49%/lift and lower)  Function - Chair/bed transfer Chair/bed transfer assist level: Moderate assist (Pt 50 - 74%/lift or  lower) Chair/bed transfer assistive device: Armrests Chair/bed transfer details: Manual facilitation for weight shifting, Verbal cues for safe use of DME/AE, Manual facilitation for placement, Verbal cues for precautions/safety, Verbal cues for sequencing, Verbal cues for technique, Tactile cues for initiation, Tactile cues for posture, Manual facilitation for weight bearing  Function - Locomotion: Wheelchair Will patient use wheelchair at discharge?: (TBD) Type: Manual Max wheelchair distance: 45' Assist Level: Supervision or verbal cues Assist Level: Supervision or verbal cues  Wheel 150 feet activity did not occur: Safety/medical concerns Turns around,maneuvers to table,bed, and toilet,negotiates 3% grade,maneuvers on rugs and over doorsills: No Function - Locomotion: Ambulation Assistive device: Rail in hallway Max distance: 10' Assist level: Maximal assist (Pt 25 - 49%) Assist level: Maximal assist (Pt 25 - 49%) Walk 50 feet with 2 turns activity did not occur: Safety/medical concerns Walk 150 feet activity did not occur: Safety/medical concerns Walk 10 feet on uneven surfaces activity did not occur: Safety/medical concerns  Function - Comprehension Comprehension: Auditory Comprehension assist level: Understands basic 90% of the time/cues < 10% of the time  Function - Expression Expression: Verbal Expression assist level: Expresses basic 90% of the time/requires cueing < 10% of the time.  Function - Social Interaction Social Interaction assist level: Interacts appropriately 90% of the time - Needs monitoring or encouragement for participation or interaction.  Function - Problem Solving Problem solving assist level: Solves basic 90% of the time/requires cueing < 10% of the time  Function - Memory Memory assist level: Recognizes or recalls 90% of the time/requires cueing < 10% of the time Patient normally able to recall (first 3 days only): That he or she is in a hospital,  Staff names and faces, Current season  Medical Problem List and Plan: 1.  Left hemiparesis, limitations with self-care, balance deficits secondary to right medullary infarct with buttocks cyst Team conference today please see physician documentation under team conference tab, met with team face-to-face to discuss problems,progress, and goals. Formulized individual treatment plan based on medical history, underlying problem and comorbidities. 2.  DVT Prophylaxis/Anticoagulation: Pharmaceutical: Lovenox 3. Idiopathic neuropathy/Pain Management: Gabapentin was added for LUE dysesthesias. Continue ultram prn as at home.  4. Mood: LCSW to follow for evaluation and support.  5. Neuropsych: This patient is capable of making decisions on his own behalf. 6. Skin/Wound Care: routine pressure relief measures.  7. Fluids/Electrolytes/Nutrition: Monitor I/O. Check lytes in am.  8. COPD: Reports increase in Wheezing/SOB due to URI? Will check CXR. increase albuterol to every 4 hours prn. Continue Onoro. Encourage IS to help with atelectasis 9.  Acute on chronic renal failure--stage IV:  Baseline SCr-3.65. Renal status improving. On calcitriol for secondary hyperparathyroidism. Continue to monitor with serial checks. Avoid ARB/ No ACE due to allergy.  Recheck creat 3.25- at baseline 10. H/o gastritis due to NSAIDs/ GERD: Continue PPI.  11. Dyslipidemia: on Lipitor.  12. CAD/AS: Treated medically with metoprolol, ASA and lipitor.  13. Peri anal cyst:  With acute on chronic inflammation. No prior treatment per reports. Started on Augmentin on 8/3 for  7-10 days treatment. Continue moist warm compresses to help drain.    14. Anemia of chronic disease: Hgb- 11.2-12.5 range. Continue to monitor.  15. Constipation: will schedule Senna S 2 at bedtime. 16.  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 Multifocal neuropathy, hx of sciatica, may benefit from outpt  EMG to further evaluate   LOS (Days) 2 A FACE TO FACE EVALUATION WAS PERFORMED  Charlett Blake 08/21/2017, 8:05 AM

## 2017-08-21 NOTE — Progress Notes (Signed)
Occupational Therapy Session Note  Patient Details  Name: [name] MRN: [id number] Date of Birth: 09/22/1935  Today's Date: 08/21/2017 OT Individual Time: 0930-1030 OT Individual Time Calculation (min): 60 min    Short Term Goals: Week 1:  OT Short Term Goal 1 (Week 1): Pt will perform toilet transfer with mod A.  OT Short Term Goal 2 (Week 1): Pt will perform LB dressing with mod A.  OT Short Term Goal 3 (Week 1): Pt will maintain min A standing balance during LB clothing management.  OT Short Term Goal 4 (Week 1): Pt will demonstrate self ROM HEP with min verbal cues for technique and paper hand out as needed.   Skilled Therapeutic Interventions/Progress Updates:    Pt resting in bed upon arrival and agreeable to therapy.  OT intervention with focus on bed mobility, functional transfers, sit<>stand, standing balance, BADL retraining, attention/positioning of LUE, and safety awareness to increase independence with BADLs. Pt performed supine>sit EOB with supervision and use of bed rails.  Pt required mod A for squat pivot transfer to w/c.  Pt engaged in bathing/dressing with sit<>stand from w/c at sink.  Pt initiates use of LUE for bathing/dressing tasks with some HOH required to complete task.  Pt educated on UB dressing techniques.  Pt requires mod A/max A for standing balance at sink with L kneed blocked to prevent buckling. Pt remained in w/c with belt alarm activated and all needs within reach.   Therapy Documentation Precautions:  Precautions Precautions: Fall Restrictions Weight Bearing Restrictions: No  Pain:  Pt denies pain  See Function Navigator for Current Functional Status.   Therapy/Group: Individual Therapy  Leroy Libman 08/21/2017, 10:56 AM

## 2017-08-21 NOTE — Progress Notes (Signed)
Nurse called to room, pt c/o of being dizzy, feeling tired andleft hand drawing up, increase tingling, pt has decreased grip noted, unable to lift arm up, pt now unable to lift left leg but has planter and flexion. Left facial droop more pronounced, code stroke called, O2 applied.  Pt taken to CT. Family at bedside. VS noted in system.

## 2017-08-21 NOTE — Patient Care Conference (Signed)
Inpatient RehabilitationTeam Conference and Plan of Care Update Date: 08/21/2017   Time: 11:20 AM    Patient Name: [name]      Medical Record Number: [id number]  Date of Birth: 01-30-1935 Sex: Male         Room/Bed: 4W20C/4W20C-01 Payor Info: Payor: Marine scientist / Plan: [employer] MEDICARE / Product Type: *No Product type* /    Admitting Diagnosis: Rt CVA  Admit Date/Time:  08/19/2017  2:58 PM Admission Comments: No comment available   Primary Diagnosis:  <principal problem not specified> Principal Problem: <principal problem not specified>  Patient Active Problem List   Diagnosis Date Noted  . 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 Acute brainstem infarction ([place])   . Generalized OA   . Gastritis and gastroduodenitis   . History of GI bleed   . Chronic obstructive pulmonary disease ([place])   . AKI (acute kidney injury) ([place])   . Stage 3 chronic kidney disease ([place])   . Perianal abscess   . CVA (cerebral vascular accident) ([place]) 08/14/2017  . Renal failure (ARF), acute on chronic ([facility]) 08/13/2017  . Thoracic aortic aneurysm without rupture ([place]) 12/04/2016  . Essential hypertension 12/04/2016    Expected Discharge Date: Expected Discharge Date: 09/05/17  Team Members Present: Physician leading conference: Dr. Alysia Penna Social Worker Present: Ovidio Kin, LCSW Nurse Present: Isla Pence, RN PT Present: Michaelene Song, PT OT Present: Cherylynn Ridges, OT SLP Present: Weston Anna, SLP PPS Coordinator present : Daiva Nakayama, RN, CRRN     Current Status/Progress Goal Weekly Team Focus  Medical   Left foot drop, question chronicity, right ulnar neuropathy, left hemiparesis secondary to right medullary infarct, intermittent dizziness  Obtain medical stability, establish etiology and chronicity of left foot drop  Initiate rehabilitation program, monitor skin   Bowel/Bladder   Continent of bowel/bladder. LBM 08/20/17  To remain continent of  bowel/bladder with min assist  Assess bowel/bladder function q shift and as needed   Swallow/Nutrition/ Hydration             ADL's   max A functional transfers, mod A UB self care, Max A LB self care, low endurance  supervision overall, CGA - shower transfer  ADL retraining, balance, funcational transfers, L NMR, coordination, fatigue, pt/family edu   Mobility   mod assist transfers, max assist gait at rail w/ w/c follow, L pushing tendencies and impulsive  supervision to min assist household gait   activity tolerance, functional mobility, postural control   Communication   Supervision   Mod I   speech intelligibility strategies    Safety/Cognition/ Behavioral Observations  Supervision   Mod I   emergent awareness, complex problme solving    Pain   No complain of pain  Assess and treat pain q shift and as needed      Skin   MASD ti the grion, with barrer cream  No skin breakdown/infection with min assist  Assess skin q shift and as needed      *See Care Plan and progress notes for long and short-term goals.     Barriers to Discharge  Current Status/Progress Possible Resolutions Date Resolved   Physician    Wound Care;Medical stability     Progressing towards goals  Continue rehabilitation program, continue medication management      Nursing  Decreased caregiver support  family members have physical limitations            PT  Decreased caregiver  support  wife unable to provide much physical assist, PRN assist from children              OT Decreased caregiver support;Other (comments)  pt is caregiver for wife at home             SLP                SW Decreased caregiver support wife can not provide physical care            Discharge Planning/Teaching Needs:  Wife is not able to provide physical care and pt will need to be supervision level to be able to return home.      Team Discussion:  Goals supervision to CGA-light touching. Balance and coordination has been  affected. Fatigues easily and needs rest breaks in between therapies. Ques if will need AFO, continue to evaluate. Wheezing this am had breathing treatmetn and was better. Needs EMG as an OP for follow up.  Revisions to Treatment Plan:  DC 8/22    Continued Need for Acute Rehabilitation Level of Care: The patient requires daily medical management by a physician with specialized training in physical medicine and rehabilitation for the following conditions: Daily direction of a multidisciplinary physical rehabilitation program to ensure safe treatment while eliciting the highest outcome that is of practical value to the patient.: Yes Daily medical management of patient stability for increased activity during participation in an intensive rehabilitation regime.: Yes Daily analysis of laboratory values and/or radiology reports with any subsequent need for medication adjustment of medical intervention for : Wound care problems;Neurological problems  Elease Hashimoto 08/21/2017, 1:37 PM

## 2017-08-22 ENCOUNTER — Inpatient Hospital Stay (HOSPITAL_COMMUNITY): Payer: Medicare Other | Admitting: Physical Therapy

## 2017-08-22 ENCOUNTER — Inpatient Hospital Stay (HOSPITAL_COMMUNITY): Payer: Medicare Other | Admitting: Speech Pathology

## 2017-08-22 ENCOUNTER — Inpatient Hospital Stay (HOSPITAL_COMMUNITY): Payer: Medicare Other

## 2017-08-22 LAB — CBC
HEMATOCRIT: 34 % — AB (ref 39.0–52.0)
Hemoglobin: 10.4 g/dL — ABNORMAL LOW (ref 13.0–17.0)
MCH: 27.4 pg (ref 26.0–34.0)
MCHC: 30.6 g/dL (ref 30.0–36.0)
MCV: 89.5 fL (ref 78.0–100.0)
PLATELETS: 174 10*3/uL (ref 150–400)
RBC: 3.8 MIL/uL — AB (ref 4.22–5.81)
RDW: 15.7 % — ABNORMAL HIGH (ref 11.5–15.5)
WBC: 7.2 10*3/uL (ref 4.0–10.5)

## 2017-08-22 LAB — BASIC METABOLIC PANEL
Anion gap: 10 (ref 5–15)
BUN: 57 mg/dL — ABNORMAL HIGH (ref 8–23)
CHLORIDE: 109 mmol/L (ref 98–111)
CO2: 20 mmol/L — AB (ref 22–32)
CREATININE: 3.53 mg/dL — AB (ref 0.61–1.24)
Calcium: 8.7 mg/dL — ABNORMAL LOW (ref 8.9–10.3)
GFR calc Af Amer: 17 mL/min — ABNORMAL LOW (ref >60)
GFR calc non Af Amer: 15 mL/min — ABNORMAL LOW (ref >60)
Glucose, Bld: 101 mg/dL — ABNORMAL HIGH (ref 70–99)
POTASSIUM: 4.6 mmol/L (ref 3.5–5.1)
Sodium: 139 mmol/L (ref 135–145)

## 2017-08-22 LAB — GLUCOSE, CAPILLARY
Glucose-Capillary: 89 mg/dL (ref 70–99)
Glucose-Capillary: 98 mg/dL (ref 70–99)
Glucose-Capillary: 93 mg/dL (ref 70–99)
Glucose-Capillary: 114 mg/dL — ABNORMAL HIGH (ref 70–99)

## 2017-08-22 MED ORDER — SODIUM CHLORIDE 0.45 % IV SOLN
INTRAVENOUS | Status: DC
  Administered 2017-08-22 – 2017-08-30 (×8): via INTRAVENOUS

## 2017-08-22 NOTE — IPOC Note (Signed)
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 ([place])     Functional Problem List: Nursing Behavior, Skin Integrity, Medication Management, Endurance, Safety, Bowel, Bladder  PT Balance, Behavior, Endurance, Motor, Safety, Sensory  OT Balance, Cognition, Endurance, Perception  SLP    TR         Basic ADL's: OT Eating, Grooming, Bathing, Dressing, Toileting     Advanced  ADL's: OT       Transfers: PT Bed Mobility, Bed to Chair, Car, [employer], Futures trader, Tub/Shower     Locomotion: PT Stairs, Emergency planning/management officer, Ambulation     Additional Impairments: OT Fuctional Use of Upper Extremity  SLP Swallowing, Communication, Social Cognition expression Problem Solving, Awareness  TR      Anticipated Outcomes Item Anticipated Outcome  Self Feeding set up A  Swallowing      Basic self-care  supervision  Toileting  supervision   Bathroom Transfers supervision- toilet, CGA - shower  Bowel/Bladder  maintain regular pattern of voiding and bowel movements  Transfers  supervision  Locomotion  min assist household gait  Communication  Mod I  Cognition  Mod I  Pain  remain free of pain or below 2  Safety/Judgment  Remain free of falls, infection and skin breakdown. Follow safety plan   Therapy Plan: PT Intensity: Minimum of 1-2 x/day ,45 to 90 minutes PT Frequency: 5 out of 7 days PT Duration Estimated Length of Stay: 14-18 days OT Intensity: Minimum of 1-2 x/day, 45 to 90 minutes OT Frequency: 5 out of 7 days OT Duration/Estimated Length of Stay: 14-17 days SLP Intensity: Minumum of 1-2 x/day, 30 to 90 minutes SLP Frequency: 3 to 5 out of 7 days SLP Duration/Estimated Length of Stay: 14 days     Team Interventions: Nursing Interventions Skin Care/Wound Management, Bladder  Management, Bowel Management, Discharge Planning, Patient/Family Education  PT interventions Ambulation/gait training, Disease management/prevention, Pain management, Stair training, Visual/perceptual remediation/compensation, Wheelchair propulsion/positioning, Therapeutic Activities, Patient/family education, DME/adaptive equipment instruction, Training and development officer, Cognitive remediation/compensation, Psychosocial support, Therapeutic Exercise, Skin care/wound management, Functional mobility training, Community reintegration, UE/LE Strength taining/ROM, UE/LE Coordination activities, Splinting/orthotics, Neuromuscular re-education, Discharge planning  OT Interventions Balance/vestibular training, Self Care/advanced ADL retraining, Therapeutic Exercise, Wheelchair propulsion/positioning, Neuromuscular re-education, Cognitive remediation/compensation, DME/adaptive equipment instruction, UE/LE Strength taining/ROM, Academic librarian, Barrister's clerk education, UE/LE Coordination activities, Discharge planning, Functional mobility training, Psychosocial support, Therapeutic Activities  SLP Interventions Cognitive remediation/compensation, Cueing hierarchy, Functional tasks, Medication managment  TR Interventions    SW/CM Interventions Discharge Planning, Patient/Family Education, Psychosocial Support   Barriers to Discharge MD  Medical stability and Neurogenic bowel and bladder  Nursing Decreased caregiver support family members have physical limitations  PT Decreased caregiver support wife unable to provide much physical assist, PRN assist from children  OT Decreased caregiver support, Other (comments) pt is caregiver for wife at home  SLP      SW Decreased caregiver support wife can not provide physical care   Team Discharge Planning: Destination: PT-Home ,OT- Home , SLP-Home Projected Follow-up: PT-Home health PT, OT-  Home health OT, 24 hour supervision/assistance, SLP-None, 24  hour supervision/assistance(from a phsyical standpoint) Projected Equipment Needs: PT-To be determined, OT- To be determined, SLP-None recommended by SLP Equipment Details: PT- , OT-  Patient/family involved in discharge planning: PT- Patient,  OT-Patient, SLP-Patient  MD ELOS: 10-14d Medical Rehab Prognosis:  Good Assessment:  82 year old male with history of hypertension, CKD stage IV, GI bleed, COPD, CAD-not surgical candidate, peripheral neuropathy; who was admitted on 08/13/2017 with dizziness, left-sided numbness tingling and weakness.  History taken from chart review, family and patient. CT of head reviewed, unremarkable for acute intracranial process. He was found to have acute on chronic renal failure and started on IV fluids for hydration.  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 He had developed left-sided weakness on 7/31, and repeat MRI brain done revealing evolving acute right medullary infarct without associated hemorrhage and partially thrombosed large fusiform aneurysm to be stable.  Carotid Dopplers showed no significant ICA stenosis. 2 D echo showed EF of 60 to 65%, no wall abnormality.  Dr. Doy Mince recommended aspirin 325 mg    Now requiring 24/7 Rehab RN,MD, as well as CIR level PT, OT .  Treatment team will focus on ADLs and mobility with goals set at [place] See Team Conference Notes for weekly updates to the plan of care

## 2017-08-22 NOTE — Progress Notes (Signed)
STROKE TEAM PROGRESS NOTE   SUBJECTIVE (INTERVAL HISTORY) His daughter and wife and other family members as well as speech therapist are at the bedside.  Overall he feels his condition is rapidly improving. He stated that yesterday after he worked with PT, he was in bed and had sudden onset left leg tightness feeling and left arm drawing up with worsening weakness. Had CT showed no acute abnormality. After CT his symptoms much improved. Episode lasted about 58min as per family. Today he stated that his symptoms improved but still not back to his condition before the episode.    OBJECTIVE Temp:  [98.2 F (36.8 C)-98.7 F (37.1 C)] 98.2 F (36.8 C) (08/08 1423) Pulse Rate:  [66-81] 81 (08/08 1423) Resp:  [17-19] 18 (08/08 0601) BP: (113-150)/(71-91) 148/91 (08/08 1423) SpO2:  [96 %-99 %] 97 % (08/08 1423)  Recent Labs  Lab 08/21/17 0627 08/21/17 1147 08/21/17 1652 08/22/17 0722 08/22/17 1130  GLUCAP 90 108* 97 89 98   Recent Labs  Lab 08/16/17 0511 08/18/17 0451 08/19/17 0519 08/19/17 1617 08/20/17 0601 08/22/17 0551  NA 141 142 140  --  139 139  K 4.8 5.1 4.8  --  5.1 4.6  CL 115* 116* 113*  --  108 109  CO2 21* 22 20*  --  22 20*  GLUCOSE 98 101* 107*  --  95 101*  BUN 40* 35* 40*  --  41* 57*  CREATININE 3.29* 3.03* 3.22* 3.43* 3.25* 3.53*  CALCIUM 8.3* 8.4* 8.3*  --  8.7* 8.7*   Recent Labs  Lab 08/20/17 0601  AST 18  ALT 21  ALKPHOS 90  BILITOT 0.8  PROT 6.1*  ALBUMIN 3.1*   Recent Labs  Lab 08/20/17 0601 08/22/17 0551  WBC 6.1 7.2  NEUTROABS 4.1  --   HGB 10.6* 10.4*  HCT 34.4* 34.0*  MCV 88.4 89.5  PLT 165 174   No results for input(s): CKTOTAL, CKMB, CKMBINDEX, TROPONINI in the last 168 hours. No results for input(s): LABPROT, INR in the last 72 hours. No results for input(s): COLORURINE, LABSPEC, [place], GLUCOSEU, HGBUR, BILIRUBINUR, KETONESUR, PROTEINUR, UROBILINOGEN, NITRITE, LEUKOCYTESUR in the last 72 hours.  Invalid input(s): APPERANCEUR      Component Value Date/Time   CHOL 157 08/15/2017 0410   CHOL 136 02/08/2011 0441   TRIG 204 (H) 08/15/2017 0410   TRIG 130 02/08/2011 0441   HDL 24 (L) 08/15/2017 0410   HDL 23 (L) 02/08/2011 0441   CHOLHDL 6.5 08/15/2017 0410   VLDL 41 (H) 08/15/2017 0410   VLDL 26 02/08/2011 0441   LDLCALC 92 08/15/2017 0410   LDLCALC 87 02/08/2011 0441   Lab Results  Component Value Date   HGBA1C 5.4 08/16/2017   No results found for: LABOPIA, COCAINSCRNUR, LABBENZ, AMPHETMU, THCU, LABBARB  No results for input(s): ETH in the last 168 hours.  I have personally reviewed the radiological images below and agree with the radiology interpretations.  Dg Chest 2 View  Result Date: 08/19/2017 CLINICAL DATA:  Wheezing. EXAM: CHEST - 2 VIEW COMPARISON:  Radiograph of August 13, 2017. FINDINGS: The heart size and mediastinal contours are within normal limits. Both lungs are clear. The visualized skeletal structures are unremarkable. IMPRESSION: No active cardiopulmonary disease. Electronically Signed   By: Marijo Conception, M.D.   On: 08/19/2017 17:02   Ct Head Wo Contrast  Result Date: 08/13/2017 CLINICAL DATA:  Acute presentation with severe dizziness. EXAM: CT HEAD WITHOUT CONTRAST TECHNIQUE: Contiguous axial images were obtained  from the base of the skull through the vertex without intravenous contrast. COMPARISON:  None. FINDINGS: Brain: Generalized atrophy. No sign of acute infarction, mass lesion, hemorrhage, hydrocephalus or extra-axial collection. 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 Vessel diameter up to 14 mm in diameter either at the distal vertebral or proximal basilar artery. Lesser ectasia of the supraclinoid internal carotid arteries. Skull: Normal Sinuses/Orbits: Clear sinuses. No fluid in the middle ears or mastoids. Orbits negative. Other: None IMPRESSION: No acute finding. The patient does have advanced atherosclerotic disease with  dolichoectasia of the intracranial vessels, including dilatation of either the distal vertebral artery or the proximal basilar artery to a diameter of 14 mm. Given this, the possibility of acute posterior circulation ischemia should be considered given this presentation, though there is no discrete CT finding presently. Electronically Signed   By: Nelson Chimes M.D.   On: 08/13/2017 14:42   Mr Jodene Nam Head Wo Contrast  Result Date: 08/22/2017 CLINICAL DATA:  Stroke follow-up.  Left-sided weakness. EXAM: MRI HEAD WITHOUT CONTRAST MRA HEAD WITHOUT CONTRAST TECHNIQUE: Multiplanar, multiecho pulse sequences of the brain and surrounding structures were obtained without intravenous contrast. Angiographic images of the head were obtained using MRA technique without contrast. COMPARISON:  Head CT 08/21/2017 FINDINGS: MRI HEAD FINDINGS BRAIN: The midline structures are normal. Small area of diffusion restriction at the site of the previously identified right medullary infarct. No new site of ischemia. Old infarcts of the right frontal lobe and left cerebellum. Generalized atrophy without lobar predilection. Blood-sensitive sequences show no chronic microhemorrhage or superficial siderosis. SKULL AND UPPER CERVICAL SPINE: The visualized skull base, calvarium, upper cervical spine and extracranial soft tissues are normal. SINUSES/ORBITS: No fluid levels or advanced mucosal thickening. No mastoid or middle ear effusion. The orbits are normal. 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 There is generalized dilatation of both internal carotid arteries at the skull base. Anterior cerebral arteries: Normal. Middle cerebral arteries: Normal. Posterior communicating arteries: Present bilaterally. Posterior cerebral arteries: Normal. 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 Superior cerebellar arteries: Normal. Anterior inferior cerebellar arteries: Normal. Posterior inferior cerebellar arteries: Normal. IMPRESSION: 1. Redemonstration of now subacute right medullary infarct without hemorrhage or mass effect. 2. 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 3. 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 This measures approximately 67%. Electronically Signed   By: Ulyses Jarred M.D.   On: 08/22/2017 00:13   Mr Jodene Nam Head Wo Contrast  Result Date: 08/13/2017 CLINICAL DATA:  Dizziness. EXAM: MRI HEAD WITHOUT CONTRAST MRA HEAD WITHOUT CONTRAST TECHNIQUE: Multiplanar, multiecho pulse sequences of the brain and surrounding structures were obtained without intravenous contrast. Angiographic images of the head were obtained using MRA technique without contrast. COMPARISON:  Head CT 08/13/2017 FINDINGS: MRI HEAD FINDINGS BRAIN: There is mild mass effect on the right ventral aspect of the brainstem by the ectatic/aneurysmal basilar artery. There is a diffusion abnormality adjacent to the medulla oblongata that is likely secondary to the adjacent basilar artery. There is no acute infarct, acute hemorrhage or mass. 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  Minimal periventricular white matter hyperintensity. Generalized atrophy without lobar predilection. Blood-sensitive sequences show no chronic microhemorrhage or superficial siderosis. SKULL AND UPPER CERVICAL SPINE: The visualized skull base, calvarium, upper cervical spine and extracranial soft tissues are normal. SINUSES/ORBITS: No fluid levels or advanced mucosal thickening. No  mastoid or middle ear effusion. The orbits are normal. MRA HEAD FINDINGS The MRA portion of the exam is severely degraded by motion. 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 The distal basilar artery is tortuous, but poorly characterized on this study. There is inadequate visualization of the circle-of-Willis. IMPRESSION: 1. 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 The time-of-flight MRA is severely motion degraded. CT or catheter angiography would provide better characterization of the aneurysm, as clinically indicated. 2. No acute infarct. Diffusion abnormality along the right ventral aspect of the medulla oblongata is likely an artifact of the enlarged basilar artery. 3. 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 Electronically Signed   By: Ulyses Jarred M.D.   On: 08/13/2017 19:55   Mr Brain Wo Contrast  Result Date: 08/22/2017 CLINICAL DATA:  Stroke follow-up.  Left-sided weakness. EXAM: MRI HEAD WITHOUT CONTRAST MRA HEAD WITHOUT CONTRAST TECHNIQUE: Multiplanar, multiecho pulse sequences of the brain and surrounding structures were obtained without intravenous contrast. Angiographic images of the head were obtained using MRA technique without contrast. COMPARISON:  Head CT 08/21/2017 FINDINGS: MRI HEAD FINDINGS BRAIN: The midline structures are normal. Small area of diffusion restriction at the site of the previously identified right medullary infarct. No new site of ischemia. Old infarcts of the right frontal lobe and left cerebellum. Generalized atrophy without lobar predilection. Blood-sensitive sequences show no chronic microhemorrhage or superficial siderosis. SKULL AND UPPER CERVICAL SPINE: The visualized skull base, calvarium, upper cervical spine and extracranial soft tissues are normal. SINUSES/ORBITS: No fluid levels or advanced mucosal thickening. No mastoid or  middle ear effusion. The orbits are normal. 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 There is generalized dilatation of both internal carotid arteries at the skull base. Anterior cerebral arteries: Normal. Middle cerebral arteries: Normal. Posterior communicating arteries: Present bilaterally. Posterior cerebral arteries: Normal. 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 Superior cerebellar arteries: Normal. Anterior inferior cerebellar arteries: Normal. Posterior inferior cerebellar arteries: Normal. IMPRESSION: 1. Redemonstration of now subacute right medullary infarct without hemorrhage or mass effect. 2. 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 3. 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 This measures approximately 67%. Electronically Signed   By: Ulyses Jarred M.D.   On: 08/22/2017 00:13   Mr Brain Wo Contrast  Result Date: 08/14/2017 CLINICAL DATA:  82 year old male with partially thrombosed basilar artery aneurysm, suspected artifactual abnormal diffusion signal in the right medulla on brain MRI yesterday. Increased left side weakness over night. EXAM: MRI HEAD WITHOUT CONTRAST TECHNIQUE: Multiplanar, multiecho pulse sequences of the brain and surrounding structures were obtained without intravenous contrast. COMPARISON:  Brain MRI and intracranial MRA 08/13/2017. FINDINGS: Brain: Progressed and now intense wedge-shaped restricted diffusion in the right  medulla affecting the right  pyramid on series 100, image 15. There is faint corresponding T2 and FLAIR hyperintensity. No associated hemorrhage or mass effect. No other restricted diffusion. Stable mass effect on the right brainstem related to the abnormal vertebrobasilar arteries. No midline shift, ventriculomegaly, extra-axial collection or acute intracranial hemorrhage. Pituitary within normal limits. No new signal abnormality elsewhere. Small cortical and subcortical white matter encephalomalacia in the right superior frontal gyrus. Tiny chronic lacunar infarcts in the left cerebellum. No chronic cerebral blood products. Vascular: Fusiform aneurysmal vertebrobasilar junction redemonstrated measuring up to 15 millimeters diameter appears stable since yesterday with partial thrombosis (series 7, image 7), a especially affecting the distal left vertebral artery. Mild to moderate generalized intracranial artery dolichoectasia otherwise. Major intracranial vascular flow voids are stable. Skull and upper cervical spine: Stable, negative. Sinuses/Orbits: Stable, negative. Other: Visible internal auditory structures appear normal. Mastoids remain clear. Scalp and face soft tissues appear negative. IMPRESSION: 1. Evolving Acute Right Medullary Infarct at the level of the right medullary pyramid. This was thought to be artifact on yesterday's MRI. No associated hemorrhage or mass effect. 2. 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 Underlying generalized intracranial artery dolichoectasia. 3. Small chronic infarcts in the right MCA and left PICA territories. Electronically Signed   By: Genevie Ann M.D.   On: 08/14/2017 11:16   Mr Brain Wo Contrast  Result Date: 08/13/2017 CLINICAL DATA:  Dizziness. EXAM: MRI HEAD WITHOUT CONTRAST MRA HEAD WITHOUT CONTRAST TECHNIQUE: Multiplanar, multiecho pulse sequences of the brain and surrounding structures were obtained without intravenous  contrast. Angiographic images of the head were obtained using MRA technique without contrast. COMPARISON:  Head CT 08/13/2017 FINDINGS: MRI HEAD FINDINGS BRAIN: There is mild mass effect on the right ventral aspect of the brainstem by the ectatic/aneurysmal basilar artery. There is a diffusion abnormality adjacent to the medulla oblongata that is likely secondary to the adjacent basilar artery. There is no acute infarct, acute hemorrhage or mass. 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 Minimal periventricular white matter hyperintensity. Generalized atrophy without lobar predilection. Blood-sensitive sequences show no chronic microhemorrhage or superficial siderosis. SKULL AND UPPER CERVICAL SPINE: The visualized skull base, calvarium, upper cervical spine and extracranial soft tissues are normal. SINUSES/ORBITS: No fluid levels or advanced mucosal thickening. No mastoid or middle ear effusion. The orbits are normal. MRA HEAD FINDINGS The MRA portion of the exam is severely degraded by motion. 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 The distal basilar artery is tortuous, but poorly characterized on this study. There is inadequate visualization of the circle-of-Willis. IMPRESSION: 1. 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 The time-of-flight MRA is severely motion degraded. CT or catheter angiography would provide better characterization of the aneurysm, as clinically indicated. 2. No acute infarct. Diffusion abnormality along the right ventral aspect of the medulla oblongata is likely an artifact of the enlarged basilar artery. 3. 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 Electronically Signed   By: Ulyses Jarred M.D.   On: 08/13/2017 19:55   US Carotid Bilateral  Result Date: 08/14/2017 CLINICAL DATA:  Stroke-like symptoms EXAM: BILATERAL CAROTID DUPLEX ULTRASOUND  TECHNIQUE: Pearline Cables scale imaging, color Doppler and duplex ultrasound were performed of bilateral carotid and vertebral arteries in the neck. COMPARISON:  None. FINDINGS: Criteria: Quantification of carotid stenosis is based on velocity parameters that correlate the  residual internal carotid diameter with NASCET-based stenosis levels, using the diameter of the distal internal carotid lumen as the denominator for stenosis measurement. The following velocity measurements were obtained: RIGHT ICA:  78 cm/sec CCA:  81 cm/sec SYSTOLIC ICA/CCA RATIO:  1.0 DIASTOLIC ICA/CCA RATIO: ECA:  95 cm/sec LEFT ICA:  113 cm/sec CCA:  78 cm/sec SYSTOLIC ICA/CCA RATIO:  1.4 DIASTOLIC ICA/CCA RATIO: ECA:  143 cm/sec RIGHT CAROTID ARTERY: Moderate irregular calcified plaque in the bulb. Low resistance internal carotid Doppler pattern is preserved. RIGHT VERTEBRAL ARTERY:  Antegrade. LEFT CAROTID ARTERY: Moderate irregular calcified plaque in the bulb. Low resistance internal carotid Doppler pattern. LEFT VERTEBRAL ARTERY:  Antegrade. IMPRESSION: Less than 50% stenosis in the right and left internal carotid arteries. Electronically Signed   By: Marybelle Killings M.D.   On: 08/14/2017 15:16   Dg Chest Port 1 View  Result Date: 08/13/2017 CLINICAL DATA:  Acute presentation with dizziness and chest pain. EXAM: PORTABLE CHEST 1 VIEW COMPARISON:  09/30/2013 FINDINGS: Poor inspiration. The upper lungs are clear. Abnormal density in both lower lobes could relate to the poor inspiration or there could be lower lobe atelectasis or pneumonia. No dense consolidation or lobar collapse. No effusions. No abnormal bone finding. Heart size appears normal. There is aortic atherosclerosis. IMPRESSION: Poor inspiration. Increased markings at the lung bases may be secondary to that. The possibility of lower lobe atelectasis or pneumonia does exist based on this exam. Electronically Signed   By: Nelson Chimes M.D.   On: 08/13/2017 14:38   Ct Head Code Stroke Wo  Contrast  Result Date: 08/21/2017 CLINICAL DATA:  Code stroke.  Stroke follow-up EXAM: CT HEAD WITHOUT CONTRAST TECHNIQUE: Contiguous axial images were obtained from the base of the skull through the vertex without intravenous contrast. COMPARISON:  08/14/2017 FINDINGS: Brain: No evidence of acute infarction, hemorrhage, hydrocephalus, extra-axial collection or mass lesion/mass effect. Subtle low-density appearance in the left thalamus on axial slices is not confirmed on sagittal images. Known recent right medulla infarct without evident progression. Vascular: Vertebrobasilar dolichoectasia. The supraclinoid ICAs are also ectatic appearing. The basilar is partially thrombosed MRI. No hyperdensity of the vessel to suggest complete thrombosis. Skull: Normal. Negative for fracture or focal lesion. Sinuses/Orbits: Negative Other: These results were communicated to Dr. Leonel Ramsay at 3:22 pmon 8/7/2019by text page via the [facility] messaging system. ASPECTS [facility] Stroke Program Early CT Score) Not scored with this history IMPRESSION: 1. No acute finding. 2. 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 3. Vertebrobasilar dolichoectasia. Electronically Signed   By: Monte Fantasia M.D.   On: 08/21/2017 15:31   TTE - Left ventricle: The cavity size was normal. There was mild   concentric hypertrophy. Systolic function was normal. The   estimated ejection fraction was in the range of 60% to 65%. Wall   motion was normal; there were no regional wall motion   abnormalities. Doppler parameters are consistent with abnormal   left ventricular relaxation (grade 1 diastolic dysfunction). - Aortic valve: moderately calcified leaflets. There was very mild   stenosis. Valve area (VTI): 1.61 cm^2. - Left atrium: The atrium was normal in size. - Right ventricle: Systolic function was normal. - Pulmonary arteries: Systolic pressure was within the normal   range. Impressions: - No cardiac source of emboli was  indentified.   PHYSICAL EXAM  Temp:  [98.2 F (36.8 C)-98.7 F (37.1 C)] 98.2 F (36.8 C) (08/08 1423) Pulse Rate:  [66-81] 81 (08/08 1423) Resp:  [17-19] 18 (08/08 0601) BP: (113-150)/(71-91)  148/91 (08/08 1423) SpO2:  [96 %-99 %] 97 % (08/08 1423)  General - Well nourished, well developed, in no apparent distress.  Ophthalmologic - fundi not visualized due to noncooperation.  Cardiovascular - Regular rate and rhythm.  Mental Status -  Level of arousal and orientation to time, place, and person were intact. Language including expression, naming, repetition, comprehension was assessed and found intact. Fund of Knowledge was assessed and was intact.  Cranial Nerves II - XII - II - Visual field intact OU. III, IV, VI - Extraocular movements intact. V - Facial sensation intact bilaterally. VII - mild left facial droop. VIII - Hearing & vestibular intact bilaterally. X - Palate elevates symmetrically. XI - Chin turning & shoulder shrug intact bilaterally. XII - Tongue protrusion intact.  Motor Strength - The patient's strength was normal in RUE and RLE, but LUE 3-/5 and LLE 3+/5, b/l feet DF 3-4/5 and PF 4/5.  Bulk was normal and fasciculations were absent.   Motor Tone - Muscle tone was assessed at the neck and appendages and was normal.  Reflexes - The patient's reflexes were symmetrical in all extremities and he had no pathological reflexes.  Sensory - Light touch, temperature/pinprick were assessed and were symmetrical.    Coordination - The patient had normal movements in the right hand with no ataxia or dysmetria.  Tremor was absent.  Gait and Station - deferred.   ASSESSMENT/PLAN [name] is a 82 y.o. male with history of CAD, CKD stage IV, HTN, AS, recent stroke admitted for CIR. Had episode of left UE and LE muscle spasm and worsening weakness. No tPA given due to recent stroke.    TIA vs. Hypoperfusion vs. 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 Continue DAPT for 3 months and then plavix alone.   Patient counseled to be compliant with his antithrombotic medications  Ongoing aggressive stroke risk factor management  Therapy recommendations:  CIR  Disposition:  Pending  Recent pontine stroke  08/14/17 admitted to [facility] due ot left sided weakness  MRI right pontine infarct  MRA - BA fusiform aneurysm with partially thrombosed   CUS neg  EF 60-65%  Put on ASA and lipitor 40  BA fusiform aneurysm  partially thrombosed  Pontine stroke could be due to perforator branch blockage either intravascularlly from thrombosis or extravascularly from local compression  High risk for BA endovascular manipulation  Will hold off any intervention   On DAPT  If recurrent stroke in the same region, or if free thrombus seen in the lumen, may consider anticoagulation.  Hypertension Stable  Long term BP goal normotensive  Avoid hypotension  Hyperlipidemia  Home meds:  lipitor 40   LDL 92, goal < 70  Now on lipitor 40  Continue statin at discharge  Other Stroke Risk Factors  Advanced age   Other Active Problems  CKD stage IV Cre 3.22->3.43->3.53  Hospital day # 3  Neurology will sign off. Please call with questions. Pt will follow up with stroke clinic NP at  [facility] in about 4 weeks. Thanks for the consult.   Rosalin Hawking, MD PhD Stroke Neurology 08/22/2017 3:18 PM    To contact Stroke Continuity provider, please refer to [email or web address] After hours, contact General Neurology

## 2017-08-22 NOTE — Progress Notes (Addendum)
Subjective/Complaints: No dizziness, had an episode lasting ~a couple hrs of increased weakness in LUE and LLE, seen by neuro, repeat CT neg for bleed, repeat MRI showed no new infarct  ROS:  Denies CP SOB N/V/D  Objective: Vital Signs: Blood pressure (!) 150/81, pulse 72, temperature 98.6 F (37 C), temperature source Oral, resp. rate 18, height _0  (1.753 m), weight 93.1 kg, SpO2 99 %. [name] Head Wo Contrast  Result Date: 08/22/2017 CLINICAL DATA:  Stroke follow-up.  Left-sided weakness. EXAM: MRI HEAD WITHOUT CONTRAST MRA HEAD WITHOUT CONTRAST TECHNIQUE: Multiplanar, multiecho pulse sequences of the brain and surrounding structures were obtained without intravenous contrast. Angiographic images of the head were obtained using MRA technique without contrast. COMPARISON:  Head CT 08/21/2017 FINDINGS: MRI HEAD FINDINGS BRAIN: The midline structures are normal. Small area of diffusion restriction at the site of the previously identified right medullary infarct. No new site of ischemia. Old infarcts of the right frontal lobe and left cerebellum. Generalized atrophy without lobar predilection. Blood-sensitive sequences show no chronic microhemorrhage or superficial siderosis. SKULL AND UPPER CERVICAL SPINE: The visualized skull base, calvarium, upper cervical spine and extracranial soft tissues are normal. SINUSES/ORBITS: No fluid levels or advanced mucosal thickening. No mastoid or middle ear effusion. The orbits are normal. 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 There is generalized dilatation of both internal carotid arteries at the skull base. Anterior cerebral arteries: Normal. Middle cerebral arteries: Normal. Posterior communicating arteries: Present bilaterally. Posterior cerebral arteries: Normal. 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 Superior cerebellar arteries: Normal. Anterior inferior cerebellar arteries: Normal. Posterior inferior cerebellar arteries: Normal. IMPRESSION: 1. Redemonstration of now subacute right medullary infarct without hemorrhage or mass effect. 2. 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 3. 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 This measures approximately 67%. Electronically Signed   By: Ulyses Jarred M.D.   On: 08/22/2017 00:13   Mr Brain Wo Contrast  Result Date: 08/22/2017 CLINICAL DATA:  Stroke follow-up.  Left-sided weakness. EXAM: MRI HEAD WITHOUT CONTRAST MRA HEAD WITHOUT CONTRAST TECHNIQUE: Multiplanar, multiecho pulse sequences of the brain and surrounding structures were obtained without intravenous contrast. Angiographic images of the head were obtained using MRA technique without contrast. COMPARISON:  Head CT 08/21/2017 FINDINGS: MRI HEAD FINDINGS BRAIN: The midline structures are normal. Small area of diffusion restriction at the site of the previously identified right medullary infarct. No new site of ischemia. Old infarcts of the right frontal lobe and left cerebellum. Generalized atrophy without lobar predilection. Blood-sensitive sequences show no chronic microhemorrhage or superficial siderosis. SKULL AND UPPER CERVICAL SPINE: The visualized skull base, calvarium, upper cervical spine and extracranial soft tissues are normal. SINUSES/ORBITS: No fluid levels or advanced mucosal thickening. No mastoid or middle ear effusion. The orbits are normal. MRA HEAD FINDINGS Intracranial internal carotid arteries: Moderate-to-severe stenosis  of the petrous segment of the right internal carotid artery. There is generalized dilatation of both internal carotid arteries at the skull base. Anterior cerebral arteries: Normal. Middle cerebral arteries: Normal. Posterior communicating arteries: Present bilaterally. Posterior cerebral arteries: Normal. 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 Superior cerebellar arteries: Normal. Anterior inferior cerebellar arteries: Normal. Posterior inferior cerebellar arteries: Normal. IMPRESSION: 1. Redemonstration of now subacute right medullary infarct without hemorrhage or mass effect. 2. 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 3. 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 This measures approximately 67%. Electronically Signed   By: Ulyses Jarred M.D.   On: 08/22/2017 00:13   Ct Head Code Stroke Wo Contrast  Result Date: 08/21/2017 CLINICAL DATA:  Code stroke.  Stroke follow-up EXAM: CT HEAD WITHOUT CONTRAST TECHNIQUE: Contiguous axial images were obtained from the base of the skull through the vertex without intravenous contrast. COMPARISON:  08/14/2017 FINDINGS: Brain: No evidence of acute infarction, hemorrhage, hydrocephalus, extra-axial collection or mass lesion/mass effect. Subtle low-density appearance in the left thalamus on axial slices is not confirmed on sagittal images. Known recent right medulla infarct without evident progression. Vascular: Vertebrobasilar dolichoectasia. The supraclinoid ICAs are also ectatic appearing. The basilar is partially thrombosed MRI. No hyperdensity of the  vessel to suggest complete thrombosis. Skull: Normal. Negative for fracture or focal lesion. Sinuses/Orbits: Negative Other: These results were communicated to Dr. Leonel Ramsay at 3:22 pmon 8/7/2019by text page via the [facility] messaging system. ASPECTS [facility] Stroke Program Early CT Score) Not scored with this history IMPRESSION: 1. No acute finding. 2. 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 3. Vertebrobasilar dolichoectasia. Electronically Signed   By: Monte Fantasia M.D.   On: 08/21/2017 15:31   Results for orders placed or performed during the hospital encounter of 08/19/17 (from the past 72 hour(s))  Creatinine, serum     Status: Abnormal   Collection Time: 08/19/17  4:17 PM  Result Value Ref Range   Creatinine, Ser 3.43 (H) 0.61 - 1.24 mg/dL   GFR calc non Af Amer 16 (L) >60 mL/min   GFR calc Af Amer 18 (L) >60 mL/min    Comment: (NOTE) The eGFR has been calculated using the CKD EPI equation. This calculation has not been validated in all clinical situations. eGFR's persistently <60 mL/min signify possible Chronic Kidney Disease. Performed at [facility], [place] [address], [place], [place] [address]   CBC WITH DIFFERENTIAL     Status: Abnormal   Collection Time: 08/20/17  6:01 AM  Result Value Ref Range   WBC 6.1 4.0 - 10.5 K/uL   RBC 3.89 (L) 4.22 - 5.81 MIL/uL   Hemoglobin 10.6 (L) 13.0 - 17.0 g/dL   HCT 34.4 (L) 39.0 - 52.0 %   MCV 88.4 78.0 - 100.0 fL   MCH 27.2 26.0 - 34.0 pg   MCHC 30.8 30.0 - 36.0 g/dL   RDW 15.8 (H) 11.5 - 15.5 %   Platelets 165 150 - 400 K/uL   Neutrophils Relative % 66 %   Neutro Abs 4.1 1.7 - 7.7 K/uL   Lymphocytes Relative 16 %   Lymphs Abs 1.0 0.7 - 4.0 K/uL   Monocytes Relative 11 %   Monocytes Absolute 0.7 0.1 - 1.0 K/uL   Eosinophils Relative 5 %   Eosinophils Absolute 0.3 0.0 - 0.7 K/uL   Basophils Relative 1 %   Basophils Absolute 0.0 0.0 - 0.1 K/uL   Immature  Granulocytes 1 %   Abs Immature Granulocytes 0.1 0.0 - 0.1 K/uL     Comment: Performed at [facility], [place] [address], [place], [place] [address]  Comprehensive metabolic panel     Status: Abnormal   Collection Time: 08/20/17  6:01 AM  Result Value Ref Range   Sodium 139 135 - 145 mmol/L   Potassium 5.1 3.5 - 5.1 mmol/L   Chloride 108 98 - 111 mmol/L   CO2 22 22 - 32 mmol/L   Glucose, Bld 95 70 - 99 mg/dL   BUN 41 (H) 8 - 23 mg/dL   Creatinine, Ser 3.25 (H) 0.61 - 1.24 mg/dL   Calcium 8.7 (L) 8.9 - 10.3 mg/dL   Total Protein 6.1 (L) 6.5 - 8.1 g/dL   Albumin 3.1 (L) 3.5 - 5.0 g/dL   AST 18 15 - 41 U/L   ALT 21 0 - 44 U/L   Alkaline Phosphatase 90 38 - 126 U/L   Total Bilirubin 0.8 0.3 - 1.2 mg/dL   GFR calc non Af Amer 17 (L) >60 mL/min   GFR calc Af Amer 19 (L) >60 mL/min    Comment: (NOTE) The eGFR has been calculated using the CKD EPI equation. This calculation has not been validated in all clinical situations. eGFR's persistently <60 mL/min signify possible Chronic Kidney Disease.    Anion gap 9 5 - 15    Comment: Performed at [facility] [address] [place], [place] [address]  Glucose, capillary     Status: None   Collection Time: 08/20/17 12:14 PM  Result Value Ref Range   Glucose-Capillary 74 70 - 99 mg/dL  Glucose, capillary     Status: Abnormal   Collection Time: 08/20/17  5:14 PM  Result Value Ref Range   Glucose-Capillary 103 (H) 70 - 99 mg/dL  Glucose, capillary     Status: Abnormal   Collection Time: 08/20/17  9:38 PM  Result Value Ref Range   Glucose-Capillary 110 (H) 70 - 99 mg/dL   Comment 1 Notify RN   Glucose, capillary     Status: None   Collection Time: 08/21/17  6:27 AM  Result Value Ref Range   Glucose-Capillary 90 70 - 99 mg/dL   Comment 1 Notify RN   Glucose, capillary     Status: Abnormal   Collection Time: 08/21/17 11:47 AM  Result Value Ref Range   Glucose-Capillary 108 (H) 70 - 99 mg/dL  Glucose, capillary     Status: None   Collection Time: 08/21/17  4:52 PM  Result Value Ref Range    Glucose-Capillary 97 70 - 99 mg/dL  Basic metabolic panel     Status: Abnormal   Collection Time: 08/22/17  5:51 AM  Result Value Ref Range   Sodium 139 135 - 145 mmol/L   Potassium 4.6 3.5 - 5.1 mmol/L   Chloride 109 98 - 111 mmol/L   CO2 20 (L) 22 - 32 mmol/L   Glucose, Bld 101 (H) 70 - 99 mg/dL   BUN 57 (H) 8 - 23 mg/dL   Creatinine, Ser 3.53 (H) 0.61 - 1.24 mg/dL   Calcium 8.7 (L) 8.9 - 10.3 mg/dL   GFR calc non Af Amer 15 (L) >60 mL/min   GFR calc Af Amer 17 (L) >60 mL/min    Comment: (NOTE) The eGFR has been calculated using the CKD EPI equation. This calculation has not been validated in all clinical situations. eGFR's persistently <60 mL/min signify possible Chronic  Kidney Disease.    Anion gap 10 5 - 15    Comment: Performed at [facility] [address] [place], [place] [address]  CBC     Status: Abnormal   Collection Time: 08/22/17  5:51 AM  Result Value Ref Range   WBC 7.2 4.0 - 10.5 K/uL   RBC 3.80 (L) 4.22 - 5.81 MIL/uL   Hemoglobin 10.4 (L) 13.0 - 17.0 g/dL   HCT 34.0 (L) 39.0 - 52.0 %   MCV 89.5 78.0 - 100.0 fL   MCH 27.4 26.0 - 34.0 pg   MCHC 30.6 30.0 - 36.0 g/dL   RDW 15.7 (H) 11.5 - 15.5 %   Platelets 174 150 - 400 K/uL    Comment: Performed at [facility], [place] [address] [place], [place] [address]  Glucose, capillary     Status: None   Collection Time: 08/22/17  7:22 AM  Result Value Ref Range   Glucose-Capillary 89 70 - 99 mg/dL     HEENT: blepharitis Right eyelid Cardio: RRR and G3 SEM Resp: CTA B/L and unlabored GI: BS positive and NT, ND Extremity:  No Edema Skin:   Other anal area not visualized pt dressed in PT gym Neuro: Alert/Oriented, Abnormal Sensory reduced   Right 5th digit UE as well as Left little toe Abnormal Motor 3- Left delt ,bi, tri grip, 4+ L HF, KE, 1/5 L ADF, Abnormal FMC Ataxic/ dec FMC and Other atrophy RIght hand intrinsics Musc/Skel:  Normal and Other no pain with UE or LE ROM, Neg tinel at R elbow Gen  NAD   Assessment/Plan: 1. 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 Physiatrist is providing close team supervision and 24 hour management of active medical problems listed below. Physiatrist and rehab team continue to assess barriers to discharge/monitor patient progress toward functional and medical goals. FIM: Function - Bathing Bathing activity did not occur: Refused Position: Wheelchair/chair at sink Body parts bathed by patient: Left arm, Chest, Abdomen, Front perineal area, Right upper leg, Left upper leg Body parts bathed by helper: Right arm, Buttocks, Right lower leg, Left lower leg, Back  Function- Upper Body Dressing/Undressing What is the patient wearing?: Pull over shirt/dress Pull over shirt/dress - Perfomed by patient: Put head through opening, Pull shirt over trunk Pull over shirt/dress - Perfomed by helper: Thread/unthread right sleeve, Thread/unthread left sleeve Assist Level: Touching or steadying assistance(Pt > 75%) Function - Lower Body Dressing/Undressing What is the patient wearing?: Pants, Socks, Shoes Position: Wheelchair/chair at sink Pants- Performed by patient: Thread/unthread right pants leg, Thread/unthread left pants leg Pants- Performed by helper: Pull pants up/down Non-skid slipper socks- Performed by helper: Don/doff right sock, Don/doff left sock Socks - Performed by patient: Don/doff right sock Socks - Performed by helper: Don/doff left sock Shoes - Performed by patient: Don/doff right shoe Shoes - Performed by helper: Don/doff left shoe, Fasten right, Fasten left Assist for footwear: Dependant  Function - Toileting Toileting steps completed by helper: Adjust clothing prior to toileting, Performs perineal hygiene, Adjust clothing after toileting Assist level: Two helpers  Function - Air cabin crew transfer assistive  device: Elevated toilet seat/BSC over toilet Assist level to toilet: Maximal assist (Pt 25 - 49%/lift and lower) Assist level from toilet: Maximal assist (Pt 25 - 49%/lift and lower)  Function - Chair/bed transfer Chair/bed transfer method: Squat pivot Chair/bed transfer assist level: Moderate assist (  Pt 50 - 74%/lift or lower) Chair/bed transfer assistive device: Armrests Chair/bed transfer details: Verbal cues for technique, Verbal cues for precautions/safety, Manual facilitation for placement  Function - Locomotion: Wheelchair Will patient use wheelchair at discharge?: (TBD) Type: Manual Max wheelchair distance: 64' Assist Level: Supervision or verbal cues Assist Level: Supervision or verbal cues Wheel 150 feet activity did not occur: Safety/medical concerns Turns around,maneuvers to table,bed, and toilet,negotiates 3% grade,maneuvers on rugs and over doorsills: No Function - Locomotion: Ambulation Assistive device: Rail in hallway Max distance: 10' Assist level: Maximal assist (Pt 25 - 49%) Assist level: Maximal assist (Pt 25 - 49%) Walk 50 feet with 2 turns activity did not occur: Safety/medical concerns Walk 150 feet activity did not occur: Safety/medical concerns Walk 10 feet on uneven surfaces activity did not occur: Safety/medical concerns  Function - Comprehension Comprehension: Auditory Comprehension assist level: Understands basic 90% of the time/cues < 10% of the time(slightly HOH )  Function - Expression Expression: Verbal Expression assist level: Expresses basic 90% of the time/requires cueing < 10% of the time.  Function - Social Interaction Social Interaction assist level: Interacts appropriately 90% of the time - Needs monitoring or encouragement for participation or interaction.  Function - Problem Solving Problem solving assist level: Solves basic 90% of the time/requires cueing < 10% of the time, Solves basic 75 - 89% of the time/requires cueing 10 - 24% of  the time  Function - Memory Memory assist level: More than reasonable amount of time Patient normally able to recall (first 3 days only): Current season, Location of own room, Staff names and faces, That he or she is in a hospital  Medical Problem List and Plan: 1.  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  DVT Prophylaxis/Anticoagulation: Pharmaceutical: Lovenox 3. Idiopathic neuropathy/Pain Management: Gabapentin was added for LUE dysesthesias. Continue ultram prn as at home.  4. Mood: LCSW to follow for evaluation and support.  5. Neuropsych: This patient is capable of making decisions on his own behalf. 6. Skin/Wound Care: routine pressure relief measures.  7. Fluids/Electrolytes/Nutrition: Monitor I/O. Check lytes in am.  8. COPD: Reports increase in Wheezing/SOB due to URI? Will check CXR. increase albuterol to every 4 hours prn. Continue Onoro. Encourage IS to help with atelectasis 9.  Acute on chronic renal failure--stage IV:  Baseline SCr-3.65. Renal status improving. On calcitriol for secondary hyperparathyroidism. Continue to monitor with serial checks. Avoid ARB/ No ACE due to allergy.  Recheck creat 3.25- but BUN creeping up will enc po and give nocturnal IVF   If inadequate intake during the day,                                  10. H/o gastritis due to NSAIDs/ GERD: Continue PPI.  11. Dyslipidemia: on Lipitor.  12. CAD/AS: Treated medically with metoprolol, ASA and lipitor.  13. Peri anal cyst:  With acute on chronic inflammation. No prior treatment per reports. Started on Augmentin on 8/3 for  7-10 days treatment. Continue moist warm compresses to help drain.    14. Anemia of chronic disease: Hgb- 11.2-12.5 range. Continue to monitor. Slightly  lower at 10.4 check stool guaic 15. Constipation: will schedule Senna S 2 at bedtime. 16.  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 Multifocal neuropathy, hx of sciatica, may benefit from outpt EMG to further evaluate   LOS (Days) 3 A FACE TO FACE EVALUATION WAS PERFORMED  Charlett Blake 08/22/2017, 7:36 AM

## 2017-08-22 NOTE — Progress Notes (Addendum)
Physical Therapy Session Note  Patient Details  Name: [name] MRN: [id number] Date of Birth: 04/22/1935  Today's Date: 08/22/2017 PT Individual Time: 0800-0910 AND 1605-1700 PT Individual Time Calculation (min): 70 min AND 55 min  Short Term Goals: Week 1:  PT Short Term Goal 1 (Week 1): Pt will transfer bed<>chair w/ min assist.  PT Short Term Goal 2 (Week 1): Pt will ambulate 59' w/ mod assist w/ LRAD. PT Short Term Goal 3 (Week 1): Pt will demonstrate neutral midline orientation during all functional mobility 50% of the time. PT Short Term Goal 4 (Week 1): Pt will demonstrate appropriate safety awareness during functional mobility 50% of the time.  Skilled Therapeutic Interventions/Progress Updates:   Session 1:  Pt in supine and agreeable to therapy, denies pain but reports continued increased in L sided weakness. Pt unable to raise LUE/LE as high in supine as he was able to at eval, per MD note, no stroke progression. Transferred to EOB w/ min assist and total assist to don LE garments, TED hose, and shoes for time management. Mod assist to don shirt w/ verbal and manual cues for technique. BP 149/79 in sitting. Min assist to stand to RW to pull LE garments over hips. Transferred to w/c via stand pivot w/ RW, min-mod assist, much improved from previous session with this therapist. Pt self-propelled w/c to/from therapy gym via R hemi technique w/ supervision to work on independence w/ locomotion. BP 135/82 once in gym, 147/93 after brief rest break. Worked on sit<>stands to RW, mod assist to stand from w/c, min guard for postural control once in standing. Manual assist to facilitate anterior weight shifting in progression to stand. Worked on equal weight shifting and lateral weight shifting w/ tactile cues for L quad activation, minimal buckling noted w/ prolonged standing. Brief seated rest breaks 2/2 fatigue and moderate increase in work of breathing and wheezing w/ standing activity.  Returned to room and ended session in supine per RN request, call bell within reach and all needs met. Encouraged to stay up in chair for a few hours later this date to work on OOB tolerance. Pt in agreement.   Session 2:  Pt in supine and agreeable to make-up session, denies pain. Transferred to EOB w/ supervision and to w/c via stand pivot w/ RW, min assist. Pt self-propelled w/c to/from therapy gym to work on independence w/ locomotion, supervision level assist. Practiced gait at rail w/ mod assist to facilitate lateral weight shifting, 20' w/ w/c follow for safety. Much improved from previous session with this therapist. Performed NuStep 5 min x2 reps to work on endurance and reciprocal movement pattern. Frequent verbal and tactile cues for neutral LLE alignment. RPE @ "light" on resistance level 3, however pt w/ moderate increase in work of breathing and wheezing after 5 min bouts, resolves w/ brief rest. Returned to room and ended session in w/c, call bell within reach and all needs met.   Therapy Documentation Precautions:  Precautions Precautions: Fall Restrictions Weight Bearing Restrictions: No Vital Signs: Therapy Vitals Temp: 98.6 F (37 C) Temp Source: Oral Pulse Rate: 72 Resp: 18 BP: (!) 150/81 Patient Position (if appropriate): Lying Oxygen Therapy SpO2: 99 % O2 Device: Room Air  See Function Navigator for Current Functional Status.   Therapy/Group: Individual Therapy  Naava Janeway K Arnette 08/22/2017, 9:13 AM

## 2017-08-22 NOTE — Progress Notes (Signed)
Occupational Therapy Session Note  Patient Details  Name: [name] MRN: [id number] Date of Birth: 06/08/1935  Today's Date: 08/22/2017 OT Individual Time: 1300-1415 OT Individual Time Calculation (min): 75 min    Short Term Goals: Week 1:  OT Short Term Goal 1 (Week 1): Pt will perform toilet transfer with mod A.  OT Short Term Goal 2 (Week 1): Pt will perform LB dressing with mod A.  OT Short Term Goal 3 (Week 1): Pt will maintain min A standing balance during LB clothing management.  OT Short Term Goal 4 (Week 1): Pt will demonstrate self ROM HEP with min verbal cues for technique and paper hand out as needed.   Skilled Therapeutic Interventions/Progress Updates:    Pt received sitting up in bed with family present, agreeable to therapy with no c/o pain. Session focused on ADL transfers, dynamic sitting balance and b/d tasks. Pt completed stand pivot transfer to w/c from EOB with mod lifting A and vc for technique/UE placement. Pt transferred to [facility] over toilet in similar fashion, but required total A to manage clothing at standing level, and mod A for maintaining standing balance. Pt's L knee was buckling several times during stand, requiring manual blocking throughout. A stedy was obtained d/t pt's weakness in standing and pt completed sit to stand with min A in stedy x3. Total A provided for posterior peri hygiene standing in stedy. Pt was transferred to TTB in walk in shower and pt completed UB/LB bathing with min A for underneath R UE. Pt was transferred back to EOB via stedy and performed LB dressing with min A sitting balance support required during L  LE figure 4 dressing. Vc required to correctly use hemi technique in donning shirt. Pt stood with Rw from elevated bed with mod A standing balance support. Pt returned to supine and was left in bed with family present and bed alarm set.   Therapy Documentation Precautions:  Precautions Precautions: Fall Restrictions Weight Bearing  Restrictions: No Vital Signs: Oxygen Therapy SpO2: 98 % O2 Device: Room Air Pain: Pain Assessment Pain Scale: 0-10 Pain Score: 0-No pain  See Function Navigator for Current Functional Status.   Therapy/Group: Individual Therapy  Curtis Sites 08/22/2017, 12:13 PM

## 2017-08-22 NOTE — Progress Notes (Signed)
Speech Language Pathology Discharge Summary  Patient Details  Name: [name] MRN: [id number] Date of Birth: 02-22-1935  Today's Date: 08/22/2017 SLP Individual Time: 5646-9806 SLP Individual Time Calculation (min): 60 min   Skilled Therapeutic Interventions:  Skilled treatment session focused on cognition goals and education with family. Pt's wife, daughter and granddaughter present. Pt presents with resolved dysarthria and complete intelligibility at the conversation level. He is oriented x 4 and able to recall events from previous day as well as earlier in the day. Pt's family provide baseline information, pt slept a lot, wife provided for all needs and he was basic in his appropriate to daily tasks. Pt with no acute cognitive deficits and is considered at baseline. Interdisciplinary team consulted regarding cognitive function. Both OT/PT agree that pt's cognitive function is appropriate and are agreeable to d/c from skilled ST.      Patient has met 2 of 3 long term goals.  Patient to discharge at overall (at baseline basic) level.  Reasons goals not met: goal set at commplex problem solving, however pt was basic problem solver at baseline, family endorases pt is at baseline   Clinical Impression/Discharge Summary:   Pt is at baseline level of cognitive function. Pt has not met complex problem solving goal d/t pt does not solve complex problems in daily life. All education completed.   Care Partner:        Recommendation:  None      Equipment:     Reasons for discharge:     Patient/Family Agrees with Progress Made and Goals Achieved: Yes   Function:    Cognition Comprehension Comprehension assist level: Follows basic conversation/direction with extra time/assistive device;Follows basic conversation/direction with no assist(HOH and no aids)  Expression   Expression assist level: Expresses basic needs/ideas: With extra time/assistive device;Expresses basic needs/ideas: With  no assist  Social Interaction Social Interaction assist level: Interacts appropriately with others - No medications needed.  Problem Solving Problem solving assist level: Solves basic problems with no assist  Memory Memory assist level: More than reasonable amount of time   Kately Graffam 08/22/2017, 3:33 PM

## 2017-08-23 ENCOUNTER — Inpatient Hospital Stay (HOSPITAL_COMMUNITY): Payer: Medicare Other | Admitting: Occupational Therapy

## 2017-08-23 ENCOUNTER — Inpatient Hospital Stay (HOSPITAL_COMMUNITY): Payer: Medicare Other

## 2017-08-23 ENCOUNTER — Inpatient Hospital Stay (HOSPITAL_COMMUNITY): Payer: Medicare Other | Admitting: Physical Therapy

## 2017-08-23 ENCOUNTER — Inpatient Hospital Stay (HOSPITAL_COMMUNITY): Payer: Medicare Other | Admitting: Speech Pathology

## 2017-08-23 DIAGNOSIS — R350 Frequency of micturition: Secondary | ICD-10-CM

## 2017-08-23 DIAGNOSIS — R062 Wheezing: Secondary | ICD-10-CM

## 2017-08-23 DIAGNOSIS — D62 Acute posthemorrhagic anemia: Secondary | ICD-10-CM

## 2017-08-23 DIAGNOSIS — D638 Anemia in other chronic diseases classified elsewhere: Secondary | ICD-10-CM

## 2017-08-23 DIAGNOSIS — N183 Chronic kidney disease, stage 3 (moderate): Secondary | ICD-10-CM

## 2017-08-23 DIAGNOSIS — M21372 Foot drop, left foot: Secondary | ICD-10-CM

## 2017-08-23 LAB — GLUCOSE, CAPILLARY
GLUCOSE-CAPILLARY: 106 mg/dL — AB (ref 70–99)
Glucose-Capillary: 83 mg/dL (ref 70–99)
Glucose-Capillary: 83 mg/dL (ref 70–99)
Glucose-Capillary: 112 mg/dL — ABNORMAL HIGH (ref 70–99)

## 2017-08-23 LAB — OCCULT BLOOD X 1 CARD TO LAB, STOOL: Fecal Occult Bld: POSITIVE — AB

## 2017-08-23 NOTE — Progress Notes (Signed)
Occupational Therapy Session Note  Patient Details  Name: [name] MRN: [id number] Date of Birth: February 07, 1935  Today's Date: 08/23/2017 OT Individual Time: 0700-0730 and [id number] (make-up session) OT Individual Time Calculation (min): 30 min and 30 min (make-up time)   Short Term Goals: Week 1:  OT Short Term Goal 1 (Week 1): Pt will perform toilet transfer with mod A.  OT Short Term Goal 2 (Week 1): Pt will perform LB dressing with mod A.  OT Short Term Goal 3 (Week 1): Pt will maintain min A standing balance during LB clothing management.  OT Short Term Goal 4 (Week 1): Pt will demonstrate self ROM HEP with min verbal cues for technique and paper hand out as needed.   Skilled Therapeutic Interventions/Progress Updates:    Session One: PT seen for OT session focusing on ADL re-training. Pt in supine uon arrival, voicing having had a rough niht of sleep, denied pain and agreeable to tx session. Used hand held urinal at bed level, declining transfer to EOB to attempt void. He transferred to EOB with min A, VCs for sequencing of technique of effective bed mobility techniques. Seated EOB, pt donned pants with steadying assist for dynamic sitting balance to bend forward to thread LE into pants. He stood from elevated EOB to RW with min A, blocking of L knee. Initially min A for balance, progressing to mod A with fatigue and when pt letting go of RW with R UE to assist with pulling pants up. He was able to cross LEs into figure four position to assist with donning shoes.  Mod A squat pivot transfer to L with max cuing for proper technique. Pt left seated in w/c at end of session, assist to set-up breakfast tray, chair alarm on and all needs in reach. During ADL tasks, pt independently initiating use of L UE at gross assist/ stabilizer level with increased time and trials. Pt very motivated in working to regain functional use of L UE.  Session Two (make-up session): Pt seen for OT session focusing  on L UE strengthening/neuro re-ed. Pt in supine upon arrival, voicing increased fatigue and generalized not feeling well, likely due to variances in BP when up with PT. RN already aware, cleared pt for EOB activities to pt tolerance. Upon arrival pt declining any out of bed activity, however, willing to work bed level on L UE.  From supine position completed the following exercises with demonstration and VCs for proper form and technique. Throughout exercises, pt required rest breaks with VCs for deep breathing techniques. Each exercise completed x2 sets of 10. Shoulder flexion  Elbow flexion/extension   Gross grasp/release (finger flexion/extension) Also completed self ROM using R UE to assist for overhead shoulder flexion and extension.  Pt left in supine at end of session, all needs in reach and bed alarm on. Voiced having had good work with exercises being harder than he intended.     Therapy Documentation Precautions:  Precautions Precautions: Fall Restrictions Weight Bearing Restrictions: No Pain:   No/denies pain  See Function Navigator for Current Functional Status.   Therapy/Group: Individual Therapy  Loretta Doutt L 08/23/2017, 6:47 AM

## 2017-08-23 NOTE — Progress Notes (Signed)
Occupational Therapy Note  Patient Details  Name: TAGGART PRASAD MRN: [id number] Date of Birth: January 15, 1936  Today's Date: 08/23/2017 OT Missed Time:   Missed Time Reason: Patient fatigue;Other (comment)(low BP)  Pt missed 60 mins skilled OT services secondary to low BP and fatigue.   Leotis Shames [facility] 08/23/2017, 9:52 AM

## 2017-08-23 NOTE — Progress Notes (Signed)
Physical Therapy Session Note  Patient Details  Name: [name] MRN: [id number] Date of Birth: November 17, 1935  Today's Date: 08/23/2017 PT Individual Time: 6553-7482 AND 1500-1600 PT Individual Time Calculation (min): 41 min AND 60 min and Today's Date: 08/23/2017 PT Missed Time: 19 Minutes Missed Time Reason: Unavailable (Comment);Patient ill (Comment)  Short Term Goals: Week 1:  PT Short Term Goal 1 (Week 1): Pt will transfer bed<>chair w/ min assist.  PT Short Term Goal 2 (Week 1): Pt will ambulate 61' w/ mod assist w/ LRAD. PT Short Term Goal 3 (Week 1): Pt will demonstrate neutral midline orientation during all functional mobility 50% of the time. PT Short Term Goal 4 (Week 1): Pt will demonstrate appropriate safety awareness during functional mobility 50% of the time.  Skilled Therapeutic Interventions/Progress Updates:   Session 1:  Missed 15 min of skilled PT 2/2 respiratory care at beginning of session. Session focused on overall OOB tolerance and endurance w/ functional mobility. Pt self-propelled w/c to therapy gym w/ supervision using R hemi technique. BP 127/63 after activity, 123/72 after prolonged rest, RN present and ok to continue therapy. Abdominal binder and TED hose in place as well. Performed sit<>stand x2 reps at high/low table w/ R open hand support to work on R weight shifting. Min-mod assist to stand. BP 91/63 in standing w/ c/o dizziness during 2nd stand. Returned to room total assist in w/c and transferred to supine, min assist. BP 135/75 in supine. RN reports pt has been in w/c since 7am, may need some time in supine for BP to return to baseline hypertensive, made next treating therapist aware. Ended session in supine, call bell within reach and all needs met. Missed 4 min at end of session 2/2 low BP/fatigue.   Session 2:  Pt in supine and agreeable to therapy, denies pain but reports feeling overall fatigued. Session foucsed on OOB tolerance and overall endurance. BP  148/72 in supine. Transferred to EOB w/ supervision, c/o some dizziness that resolves with few minutes of rest, BP 128/89 at EOB, 135/76 w/ rest. Transferred to w/c via squad pivot mod assist, increased work of breathing, BP 141/82. Performed self-care tasks at sink including washing face and shaving w/ set-up assist and verbal cues for technique to incorporate LUE. Pt 143/75 after prolonged upright activity, pt reports improved overall feeling. Pt self-propelled w/c to therapy gym w/ supervision using R hemi technique. Performed NuStep 5 min at level 1 for LE strengthening and overall endurance. Increased work of breathing w/ all mobility this session, resolves w/ prolonged rest and drinking water. Returned to room and ended session in w/c, in care of family and all needs met. Educated on importance of increasing tolerance to OOB activity. Pt and family in agreement.   Therapy Documentation Precautions:  Precautions Precautions: Fall Restrictions Weight Bearing Restrictions: No Vital Signs: Therapy Vitals Temp: (!) 97.5 F (36.4 C) Temp Source: Oral Pulse Rate: 70 Resp: 12 BP: 123/72 Patient Position (if appropriate): Sitting Oxygen Therapy SpO2: 97 % O2 Device: Room Air  See Function Navigator for Current Functional Status.   Therapy/Group: Individual Therapy  Nyela Cortinas K Arnette 08/23/2017, 8:57 AM

## 2017-08-23 NOTE — Progress Notes (Addendum)
Subjective/Complaints: Pt seen sitting up in his chair.  He states he did not sleep well overnight due to urinary incontinence.    ROS:  Denies CP SOB N/V/D  Objective: Vital Signs: Blood pressure 123/72, pulse 70, temperature (!) 97.5 F (36.4 C), temperature source Oral, resp. rate 12, height '5\' 9"'  (1.753 m), weight 93.1 kg, SpO2 97 %. Mr Jodene Nam Head Wo Contrast  Result Date: 08/22/2017 CLINICAL DATA:  Stroke follow-up.  Left-sided weakness. EXAM: MRI HEAD WITHOUT CONTRAST MRA HEAD WITHOUT CONTRAST TECHNIQUE: Multiplanar, multiecho pulse sequences of the brain and surrounding structures were obtained without intravenous contrast. Angiographic images of the head were obtained using MRA technique without contrast. COMPARISON:  Head CT 08/21/2017 FINDINGS: MRI HEAD FINDINGS BRAIN: The midline structures are normal. Small area of diffusion restriction at the site of the previously identified right medullary infarct. No new site of ischemia. Old infarcts of the right frontal lobe and left cerebellum. Generalized atrophy without lobar predilection. Blood-sensitive sequences show no chronic microhemorrhage or superficial siderosis. SKULL AND UPPER CERVICAL SPINE: The visualized skull base, calvarium, upper cervical spine and extracranial soft tissues are normal. SINUSES/ORBITS: No fluid levels or advanced mucosal thickening. No mastoid or middle ear effusion. The orbits are normal. 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 There is generalized dilatation of both internal carotid arteries at the skull base. Anterior cerebral arteries: Normal. Middle cerebral arteries: Normal. Posterior communicating arteries: Present bilaterally. Posterior cerebral arteries: Normal. 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 Superior cerebellar arteries: Normal. Anterior inferior cerebellar arteries: Normal. Posterior inferior cerebellar arteries: Normal. IMPRESSION: 1. Redemonstration of now subacute right medullary infarct without hemorrhage or mass effect. 2. 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 3. 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 This measures approximately 67%. Electronically Signed   By: Ulyses Jarred M.D.   On: 08/22/2017 00:13   Mr Brain Wo Contrast  Result Date: 08/22/2017 CLINICAL DATA:  Stroke follow-up.  Left-sided weakness. EXAM: MRI HEAD WITHOUT CONTRAST MRA HEAD WITHOUT CONTRAST TECHNIQUE: Multiplanar, multiecho pulse sequences of the brain and surrounding structures were obtained without intravenous contrast. Angiographic images of the head were obtained using MRA technique without contrast. COMPARISON:  Head CT 08/21/2017 FINDINGS: MRI HEAD FINDINGS BRAIN: The midline structures are normal. Small area of diffusion restriction at the site of the previously identified right medullary infarct. No new site of ischemia. Old infarcts of the right frontal lobe and left cerebellum. Generalized atrophy without lobar predilection. Blood-sensitive sequences show no chronic microhemorrhage or superficial siderosis. SKULL AND UPPER CERVICAL SPINE: The visualized skull base, calvarium, upper cervical spine and extracranial soft tissues are normal. SINUSES/ORBITS: No fluid levels or advanced mucosal thickening. No mastoid or middle ear effusion. The orbits are normal. MRA HEAD FINDINGS Intracranial internal carotid arteries: Moderate-to-severe stenosis of the petrous segment of the right  internal carotid artery. There is generalized dilatation of both internal carotid arteries at the skull base. Anterior cerebral arteries: Normal. Middle cerebral arteries: Normal. Posterior communicating arteries: Present bilaterally. Posterior cerebral arteries: Normal. 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 Superior cerebellar arteries: Normal. Anterior inferior cerebellar arteries: Normal. Posterior inferior cerebellar arteries: Normal. IMPRESSION: 1. Redemonstration of now subacute right medullary infarct without hemorrhage or mass effect. 2. 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 3. 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 This measures approximately 67%. Electronically Signed   By: Ulyses Jarred M.D.   On: 08/22/2017 00:13   Ct Head Code Stroke Wo Contrast  Result Date: 08/21/2017 CLINICAL DATA:  Code stroke.  Stroke follow-up EXAM: CT HEAD WITHOUT CONTRAST TECHNIQUE: Contiguous axial images were obtained from the base of the skull through the vertex without intravenous contrast. COMPARISON:  08/14/2017 FINDINGS: Brain: No evidence of acute infarction, hemorrhage, hydrocephalus, extra-axial collection or mass lesion/mass effect. Subtle low-density appearance in the left thalamus on axial slices is not confirmed on sagittal images. Known recent right medulla infarct without evident progression. Vascular: Vertebrobasilar dolichoectasia. The supraclinoid ICAs are also ectatic appearing. The basilar is partially thrombosed MRI. No hyperdensity of the vessel to suggest complete  thrombosis. Skull: Normal. Negative for fracture or focal lesion. Sinuses/Orbits: Negative Other: These results were communicated to Dr. Leonel Ramsay at 3:22 pmon 8/7/2019by text page via the [facility] messaging system. ASPECTS [facility] Stroke Program Early CT Score) Not scored with this history IMPRESSION: 1. No acute finding. 2. 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 3. Vertebrobasilar dolichoectasia. Electronically Signed   By: Monte Fantasia M.D.   On: 08/21/2017 15:31   Results for orders placed or performed during the hospital encounter of 08/19/17 (from the past 72 hour(s))  Glucose, capillary     Status: None   Collection Time: 08/20/17 12:14 PM  Result Value Ref Range   Glucose-Capillary 74 70 - 99 mg/dL  Glucose, capillary     Status: Abnormal   Collection Time: 08/20/17  5:14 PM  Result Value Ref Range   Glucose-Capillary 103 (H) 70 - 99 mg/dL  Glucose, capillary     Status: Abnormal   Collection Time: 08/20/17  9:38 PM  Result Value Ref Range   Glucose-Capillary 110 (H) 70 - 99 mg/dL   Comment 1 Notify RN   Glucose, capillary     Status: None   Collection Time: 08/21/17  6:27 AM  Result Value Ref Range   Glucose-Capillary 90 70 - 99 mg/dL   Comment 1 Notify RN   Glucose, capillary     Status: Abnormal   Collection Time: 08/21/17 11:47 AM  Result Value Ref Range   Glucose-Capillary 108 (H) 70 - 99 mg/dL  Glucose, capillary     Status: None   Collection Time: 08/21/17  4:52 PM  Result Value Ref Range   Glucose-Capillary 97 70 - 99 mg/dL  Basic metabolic panel     Status: Abnormal   Collection Time: 08/22/17  5:51 AM  Result Value Ref Range   Sodium 139 135 - 145 mmol/L   Potassium 4.6 3.5 - 5.1 mmol/L   Chloride 109 98 - 111 mmol/L   CO2 20 (L) 22 - 32 mmol/L   Glucose, Bld 101 (H) 70 - 99 mg/dL   BUN 57 (H) 8 - 23 mg/dL   Creatinine, Ser 3.53 (H) 0.61 - 1.24 mg/dL   Calcium 8.7 (L) 8.9 -  10.3 mg/dL   GFR calc non Af Amer 15 (L) >60 mL/min   GFR calc Af Amer 17 (L) >60  mL/min    Comment: (NOTE) The eGFR has been calculated using the CKD EPI equation. This calculation has not been validated in all clinical situations. eGFR's persistently <60 mL/min signify possible Chronic Kidney Disease.    Anion gap 10 5 - 15    Comment: Performed at [facility] [address], [place], [place] [address]  CBC     Status: Abnormal   Collection Time: 08/22/17  5:51 AM  Result Value Ref Range   WBC 7.2 4.0 - 10.5 K/uL   RBC 3.80 (L) 4.22 - 5.81 MIL/uL   Hemoglobin 10.4 (L) 13.0 - 17.0 g/dL   HCT 34.0 (L) 39.0 - 52.0 %   MCV 89.5 78.0 - 100.0 fL   MCH 27.4 26.0 - 34.0 pg   MCHC 30.6 30.0 - 36.0 g/dL   RDW 15.7 (H) 11.5 - 15.5 %   Platelets 174 150 - 400 K/uL    Comment: Performed at [facility], [place] [address] [place], [place] [address]  Glucose, capillary     Status: None   Collection Time: 08/22/17  7:22 AM  Result Value Ref Range   Glucose-Capillary 89 70 - 99 mg/dL  Glucose, capillary     Status: None   Collection Time: 08/22/17 11:30 AM  Result Value Ref Range   Glucose-Capillary 98 70 - 99 mg/dL  Glucose, capillary     Status: None   Collection Time: 08/22/17  4:52 PM  Result Value Ref Range   Glucose-Capillary 93 70 - 99 mg/dL  Glucose, capillary     Status: Abnormal   Collection Time: 08/22/17  9:30 PM  Result Value Ref Range   Glucose-Capillary 114 (H) 70 - 99 mg/dL  Glucose, capillary     Status: None   Collection Time: 08/23/17  6:46 AM  Result Value Ref Range   Glucose-Capillary 83 70 - 99 mg/dL     HENT: Normocephalic, atraumatic Eyes: EOMI. No discharge. Cardio: RRR. No JVD. Resp: CTA B/L and unlabored GI: BS and ND Skin: Warm and dry. Intact. Neuro: Alert/Oriented Motor: Motor: Left delt 2/5, bi, tri 2+/5, grip 3+/5  LLE: 4+/5 proximal 2/5 distally Musc/Skel:  No edema or tenderness in extremities Gen NAD. Vital signs reviewed   Assessment/Plan: 1. 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 Physiatrist is providing close team supervision and 24 hour management of active medical problems listed below. Physiatrist and rehab team continue to assess barriers to discharge/monitor patient progress toward functional and medical goals. FIM: Function - Bathing Bathing activity did not occur: Refused Position: Shower Body parts bathed by patient: Left arm, Chest, Abdomen, Front perineal area, Right upper leg, Left upper leg, Right lower leg, Left lower leg Body parts bathed by helper: Right arm, Buttocks, Back Assist Level: Touching or steadying assistance(Pt > 75%)  Function- Upper Body Dressing/Undressing What is the patient wearing?: Hospital gown Pull over shirt/dress - Perfomed by patient: Put head through opening, Pull shirt over trunk Pull over shirt/dress - Perfomed by helper: Thread/unthread right sleeve, Thread/unthread left sleeve Button up shirt - Perfomed by patient: Thread/unthread right sleeve, Thread/unthread left sleeve, Pull shirt around back Button up shirt - Perfomed by helper: Button/unbutton shirt Assist Level: Touching or steadying assistance(Pt > 75%) Function - Lower Body  Dressing/Undressing What is the patient wearing?: Pants, Shoes, Ted Hose Position: Sitting EOB Pants- Performed by patient: Thread/unthread right pants leg, Thread/unthread left pants leg Pants- Performed by helper: Fasten/unfasten pants, Pull pants up/down Non-skid slipper socks- Performed by helper: Don/doff right sock, Don/doff left sock Socks - Performed by patient: Don/doff right sock Socks - Performed by helper: Don/doff right sock, Don/doff left sock Shoes - Performed by patient: Don/doff right shoe, Don/doff left shoe Shoes - Performed by helper: Fasten right, Fasten left TED Hose - Performed by helper: Don/doff right TED hose, Don/doff left TED hose Assist for footwear: Partial/moderate  assist Assist for lower body dressing: Touching or steadying assistance (Pt > 75%)  Function - Toileting Toileting steps completed by helper: Adjust clothing prior to toileting, Performs perineal hygiene, Adjust clothing after toileting Assist level: (max A)  Function - Toilet Transfers Toilet transfer assistive device: Elevated toilet seat/BSC over toilet Assist level to toilet: Moderate assist (Pt 50 - 74%/lift or lower) Assist level from toilet: Dependent (Pt equals 0%)(obtained stedy for assist from toilet > shower)  Function - Chair/bed transfer Chair/bed transfer method: Stand pivot Chair/bed transfer assist level: Moderate assist (Pt 50 - 74%/lift or lower) Chair/bed transfer assistive device: Armrests, Walker Chair/bed transfer details: Verbal cues for technique, Verbal cues for precautions/safety, Manual facilitation for placement, Verbal cues for safe use of DME/AE  Function - Locomotion: Wheelchair Will patient use wheelchair at discharge?: (TBD) Type: Manual Max wheelchair distance: 150' Assist Level: Supervision or verbal cues Assist Level: Supervision or verbal cues Wheel 150 feet activity did not occur: Safety/medical concerns Assist Level: Supervision or verbal cues Turns around,maneuvers to table,bed, and toilet,negotiates 3% grade,maneuvers on rugs and over doorsills: No Function - Locomotion: Ambulation Assistive device: Rail in hallway Max distance: 10' Assist level: Maximal assist (Pt 25 - 49%) Assist level: Maximal assist (Pt 25 - 49%) Walk 50 feet with 2 turns activity did not occur: Safety/medical concerns Walk 150 feet activity did not occur: Safety/medical concerns Walk 10 feet on uneven surfaces activity did not occur: Safety/medical concerns  Function - Comprehension Comprehension: Auditory Comprehension assist level: Follows basic conversation/direction with extra time/assistive device, Follows basic conversation/direction with no assist  Function  - Expression Expression: Verbal Expression assist level: Expresses basic needs/ideas: With extra time/assistive device, Expresses basic needs/ideas: With no assist  Function - Social Interaction Social Interaction assist level: Interacts appropriately with others - No medications needed.  Function - Problem Solving Problem solving assist level: Solves basic problems with no assist  Function - Memory Memory assist level: More than reasonable amount of time Patient normally able to recall (first 3 days only): Current season, Location of own room, Staff names and faces, That he or she is in a hospital  Medical Problem List and Plan: 1.  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  DVT Prophylaxis/Anticoagulation: Pharmaceutical: Lovenox 3. Idiopathic neuropathy/Pain Management: Gabapentin was added for LUE dysesthesias. Continue ultram prn as at home.  4. Mood: LCSW to follow for evaluation and support.  5. Neuropsych: This patient is capable of making decisions on his own behalf. 6. Skin/Wound Care: routine pressure relief measures.  7. Fluids/Electrolytes/Nutrition: Monitor I/O.  8. COPD: Increased albuterol to every 4 hours prn. Continue Onoro. Encourage IS to help with atelectasis 9.  Acute on chronic renal failure--stage IV:  Baseline SCr-3.65. On calcitriol for secondary hyperparathyroidism. Continue to  monitor with serial checks. Avoid ARB/ No ACE due to allergy.   Cr. 3.53 on 8/8  Encourage fluid intake           IVF daily at bedtime started on 8/8  Labs ordered for Monday 10. H/o gastritis due to NSAIDs/ GERD: Continue PPI.  11. Dyslipidemia: on Lipitor.  12. CAD/AS: Treated medically with metoprolol, ASA and lipitor.  13. Peri anal cyst:  With acute on chronic inflammation. No prior treatment per reports. Started on  Augmentin on 8/3 for  7-10 days treatment. Continue moist warm compresses to help drain.    14. ABLA on Anemia of chronic disease: Hgb- 11.2-12.5 range. Continue to monitor.   Hemoglobin 10.4 on 8/8  Continue to monitor 15. Constipation: Scheduled Senna S 2 at bedtime. 16.  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 Multifocal neuropathy, hx of sciatica, may benefit from outpt EMG to further evaluate 17. Blood pressure  Orthostatics negative on 8/9 18. Urinary frequency  At night, likely related to IVF  PVRs ordered  Condom cath qhs until PVRs/IVF  LOS (Days) 4 A FACE TO FACE EVALUATION WAS PERFORMED  Laticha Ferrucci Lorie Phenix 08/23/2017, 11:26 AM

## 2017-08-24 ENCOUNTER — Inpatient Hospital Stay (HOSPITAL_COMMUNITY): Payer: Medicare Other | Admitting: Occupational Therapy

## 2017-08-24 ENCOUNTER — Inpatient Hospital Stay (HOSPITAL_COMMUNITY): Payer: Medicare Other | Admitting: Physical Therapy

## 2017-08-24 DIAGNOSIS — R350 Frequency of micturition: Secondary | ICD-10-CM

## 2017-08-24 LAB — OCCULT BLOOD X 1 CARD TO LAB, STOOL: Fecal Occult Bld: POSITIVE — AB

## 2017-08-24 LAB — GLUCOSE, CAPILLARY: GLUCOSE-CAPILLARY: 99 mg/dL (ref 70–99)

## 2017-08-24 NOTE — Progress Notes (Signed)
Physical Therapy Session Note  Patient Details  Name: SPENSER HARREN MRN: [id number] Date of Birth: 01/03/1936  Today's Date: 08/24/2017 PT Individual Time: 1550-1600 PT Individual Time Calculation (min): 10 min  and Today's Date: 08/24/2017 PT Missed Time: 65 Minutes Missed Time Reason: Patient fatigue  Short Term Goals: Week 1:  PT Short Term Goal 1 (Week 1): Pt will transfer bed<>chair w/ min assist.  PT Short Term Goal 2 (Week 1): Pt will ambulate 43' w/ mod assist w/ LRAD. PT Short Term Goal 3 (Week 1): Pt will demonstrate neutral midline orientation during all functional mobility 50% of the time. PT Short Term Goal 4 (Week 1): Pt will demonstrate appropriate safety awareness during functional mobility 50% of the time.  Skilled Therapeutic Interventions/Progress Updates:   Pt in supine and reports increased fatigue this afternoon. States he feels exhausted from getting up to toilet a few minutes prior. Requesting to rest in supine. Missed 65 min of skilled PT. Provided skilled education regarding increasing tolerance to OOB activity, especially to allow blood pressure to acclimate. Pt agreeable to sit up in recliner or w/c for an hour this evening, made RN aware of therapist's request.   Therapy Documentation Precautions:  Precautions Precautions: Fall Restrictions Weight Bearing Restrictions: No General: PT Amount of Missed Time (min): 65 Minutes PT Missed Treatment Reason: Patient fatigue Vital Signs: Therapy Vitals Temp: 98.4 F (36.9 C) Temp Source: Oral Pulse Rate: 75 Resp: 18 BP: 133/75 Patient Position (if appropriate): Lying Oxygen Therapy SpO2: 99 % O2 Device: Room Air  See Function Navigator for Current Functional Status.   Therapy/Group: Individual Therapy  Mafalda Mcginniss K Arnette 08/24/2017, 4:09 PM

## 2017-08-24 NOTE — Progress Notes (Signed)
  Subjective/Complaints: Patient generally feels well.  His complaints include nocturia without any dysuria.  He does have some pain posterior left shoulder.  Pain is acute only when he rolls to his left side.  Patient has no other complaints.  Objective: Vital Signs: Blood pressure (!) 164/88, pulse 80, temperature 98.3 F (36.8 C), temperature source Oral, resp. rate 18, height 5\' 9"  (1.753 m), weight 93.1 kg, SpO2 95 %.   Elderly male in no acute distress. HEENT exam atraumatic, normocephalic, extractor muscles are intact.  Neck is supple without lymphadenopathy or jugular venous distention Chest clear to auscultation without any increased work of breathing. Cardiac exam S1 and S2 regular without gallop Abdominal exam overweight, active bowel sounds, soft, nontender Extremities without significant edema.  Medical Problem List and Plan: 1.     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  He does have some balance deficits. 2.  DVT Prophylaxis/Anticoagulation: Pharmaceutical: Lovenox 3. Idiopathic neuropathy/Pain Management: Well-controlled. 4. Mood: LCSW to follow for evaluation and support.  5. Neuropsych: This patient is capable of making decisions on his own behalf. 6. Skin/Wound Care: routine pressure relief measures.  7. Fluids/Electrolytes/Nutrition: Monitor I/O.  8. COPD: I no current concern. 9.  Acute on chronic renal failure--stage IV:    Continue to monitor. Lab Results  Component Value Date   CREATININE 3.53 (H) 08/22/2017   Estimated Creatinine Clearance: 18.8 mL/min (A) (by C-G formula based on SCr of 3.53 mg/dL (H)).  10. H/o gastritis due to NSAIDs/ GERD currently no symptoms..  11. Dyslipidemia: on Lipitor.  12. CAD/AS: No current symptoms. 13. Peri anal cyst:  With acute on chronic inflammation. No prior treatment per reports. Started on Augmentin on 8/3 for  7-10 days treatment. Continue moist warm compresses to help drain.    14. ABLA on Anemia  of chronic disease: Hgb- 11.2-12.5 range. Continue to monitor.   Hemoglobin 10.4 on 8/8  Continue to monitor 15. Constipation: Resolved. 16.  Left foot drop reviewed notes.  Will proceed with outpatient EMG if necessary. 17. Blood pressure  115/68-164/88 Blood pressure is variable.  Will follow today.  May need medication adjustment later. 18. Urinary frequency  Seems to be nocturia.  No concerning symptoms.  Will follow for now.  LOS (Days) 5 A FACE TO FACE EVALUATION WAS PERFORMED  Bruce H Swords 08/24/2017, 10:06 AM

## 2017-08-24 NOTE — Progress Notes (Signed)
Occupational Therapy Session Note  Patient Details  Name: [name] MRN: [id number] Date of Birth: [birth year]  Today's Date: 08/24/2017 OT Group Time: 1100-1150 OT Group Time Calculation (min): 50 min   Skilled Therapeutic Interventions/Progress Updates: Pt participated in therapeutic w/c level dance group with focus on UE/LE strengthening, activity tolerance, and social participation for carryover during self care tasks. Pt was guided through various dance-based exercises involving UB/LB and trunk. Emphasis placed on Lt NMR and endurance. Pt with active(!) participation throughout session, using UEs and LEs simultaneously, smiling, and engaging with others. He also requested a few Bluegrass songs. At end of tx he reported urgent need to use restroom and was escorted back to room via RT. 10 minutes missed.       Therapy Documentation Precautions:  Precautions Precautions: Fall Restrictions Weight Bearing Restrictions: No Pain: No c/o pain during session    ADL:      See Function Navigator for Current Functional Status.   Therapy/Group: Group Therapy  Sou Nohr A Kacee Sukhu 08/24/2017, 4:47 PM

## 2017-08-24 NOTE — Progress Notes (Signed)
Physical Therapy Session Note  Patient Details  Name: [name] MRN: [id number] Date of Birth: Mar 22, 1935  Today's Date: 08/24/2017 PT Individual Time:835-930   55 min   Short Term Goals: Week 1:  PT Short Term Goal 1 (Week 1): Pt will transfer bed<>chair w/ min assist.  PT Short Term Goal 2 (Week 1): Pt will ambulate 29' w/ mod assist w/ LRAD. PT Short Term Goal 3 (Week 1): Pt will demonstrate neutral midline orientation during all functional mobility 50% of the time. PT Short Term Goal 4 (Week 1): Pt will demonstrate appropriate safety awareness during functional mobility 50% of the time.  Skilled Therapeutic Interventions/Progress Updates:   Pt received supine in bed and agreeable to PT. PT assist pt in doning brief and thigh high ted hose in supine. Moderate assist from PT for stabilization of the LLE due to mild extensor tone. Supine>sit transfer with moderate  assist and max cues for cues for sequencing. Sitting balance EOB with min progressing to supervision assist to pull pants to knees by PT.  Sit<>stand at EOB to don pants with max assist to prevent LOB to the R due to stong LLE extensor tone. Bed elevated to performed sit<>stand with moderate assist. Stand pivot transfer to [facility] with moderate assist from elevated height and max cues for proper UE placement  Pt transported to rehab gym in [facility]. Blocked practive Sit<>stand from [place] x 5 with mod-max assist from PT and LUE hand splint. PT provided max cues for improved set up, anterior weight shift midline orientation. BP taken in sitting 147/82. And in standing 164/85. Pt asymptomatic in standing. Due to LLE tone, pt unable to attempt gait on this day. Patient returned to room and left sitting in [facility] with call bell in reach and all needs met.        Therapy Documentation Precautions:  Precautions Precautions: Fall Restrictions Weight Bearing Restrictions: No    Vital Signs: Oxygen Therapy SpO2: 95 % O2 Device: Room  Air Pain: Pain Assessment Pain Scale: 0-10 Pain Score: 0-No pain   See Function Navigator for Current Functional Status.   Therapy/Group: Individual Therapy  Lorie Phenix 08/24/2017, 9:24 AM

## 2017-08-25 ENCOUNTER — Inpatient Hospital Stay (HOSPITAL_COMMUNITY): Payer: Medicare Other

## 2017-08-25 ENCOUNTER — Inpatient Hospital Stay (HOSPITAL_COMMUNITY): Payer: Medicare Other | Admitting: Physical Therapy

## 2017-08-25 LAB — GLUCOSE, CAPILLARY: Glucose-Capillary: 117 mg/dL — ABNORMAL HIGH (ref 70–99)

## 2017-08-25 MED ORDER — SALINE SPRAY 0.65 % NA SOLN
1.0000 | NASAL | Status: DC | PRN
  Administered 2017-08-30: 1 via NASAL
  Filled 2017-08-25 (×2): qty 44

## 2017-08-25 MED ORDER — CHLORHEXIDINE GLUCONATE 4 % EX LIQD
Freq: Every day | CUTANEOUS | Status: DC
  Administered 2017-08-25 – 2017-09-06 (×9): via TOPICAL
  Filled 2017-08-25 (×4): qty 15

## 2017-08-25 NOTE — Progress Notes (Signed)
Physical Therapy Session Note  Patient Details  Name: [name] MRN: [id number] Date of Birth: 02-17-1935  Today's Date: 08/25/2017 PT Individual Time: 1530-1615 PT Individual Time Calculation (min): 45 min   Short Term Goals: Week 1:  PT Short Term Goal 1 (Week 1): Pt will transfer bed<>chair w/ min assist.  PT Short Term Goal 2 (Week 1): Pt will ambulate 45' w/ mod assist w/ LRAD. PT Short Term Goal 3 (Week 1): Pt will demonstrate neutral midline orientation during all functional mobility 50% of the time. PT Short Term Goal 4 (Week 1): Pt will demonstrate appropriate safety awareness during functional mobility 50% of the time.  Skilled Therapeutic Interventions/Progress Updates:   Pt in supine and agreeable to therapy, denies pain and reports feeling better this day. BP 131/84 in supine, 120/76 once seated at EOB w/ c/o dizziness. Dizziness resolved after a few minutes, BP 125/80. Transferred to w/c via stand pivot w/ RW and min assist for balance, RW management, and verbal cues for L step placement. BP 132/83 after transfer and pt not symptomatic. Total assist w/c transport to/from therapy gym for time management. Worked on sit<>stands and pre-gait tasks at rail in hallway. Min assist to stand and for balance while performing alternating forward and backward stepping. Tactile cues for L quad activation in stance and for L foot placement. Moderate increase in work of breathing w/ brief standing that resolves w/ rest. Performed 5 stands in total. Returned to room via w/c, ended session in w/c, call bell within reach and all needs met. Family present.   Therapy Documentation Precautions:  Precautions Precautions: Fall Restrictions Weight Bearing Restrictions: No Vital Signs: Therapy Vitals Temp: 97.8 F (36.6 C) Temp Source: Oral Pulse Rate: 74 Resp: 19 BP: 121/82 Patient Position (if appropriate): Lying Oxygen Therapy SpO2: 98 % O2 Device: Room Air  See Function Navigator for  Current Functional Status.   Therapy/Group: Individual Therapy  Jahaad Penado K Arnette 08/25/2017, 4:17 PM

## 2017-08-25 NOTE — Progress Notes (Signed)
  Subjective/Complaints: Patient generally feels well.  He feels better this morning.  Shoulder pain is improved.  Nocturia last night also improved. Objective: Vital Signs: Blood pressure (!) 148/98, pulse 80, temperature 98.2 F (36.8 C), temperature source Oral, resp. rate 18, height 5\' 9"  (1.753 m), weight 93.1 kg, SpO2 96 %.   overweight male in no acute distress. HEENT exam atraumatic, normocephalic, neck supple without jugular venous distention. Chest clear to auscultation cardiac exam S1-S2 are regular. Abdominal exam overweight with bowel sounds, soft and nontender. Extremities no edema. Neurologic exam is alert  Medical Problem List and Plan: 1.     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  He does have some balance deficits. 2.  DVT Prophylaxis/Anticoagulation: Pharmaceutical: Lovenox 3. Idiopathic neuropathy/Pain Management: Well-controlled. 4. Mood: LCSW to follow for evaluation and support.  5. Neuropsych: This patient is capable of making decisions on his own behalf. 6. Skin/Wound Care: routine pressure relief measures.  7. Fluids/Electrolytes/Nutrition: Monitor I/O.  8. COPD: no current concern. 9.  Acute on chronic renal failure--stage IV:    Continue to monitor. Lab Results  Component Value Date   CREATININE 3.53 (H) 08/22/2017   Estimated Creatinine Clearance: 18.8 mL/min (A) (by C-G formula based on SCr of 3.53 mg/dL (H)).  10. H/o gastritis due to NSAIDs/ GERD currently no symptoms..  11. Dyslipidemia: on Lipitor.  12. CAD/AS: No current symptoms. 13. Peri anal cyst:  With acute on chronic inflammation. No prior treatment per reports. Started on Augmentin on 8/3 for  7-10 days treatment. Continue moist warm compresses to help drain.    14. ABLA on Anemia of chronic disease: Hgb- 11.2-12.5 range. Continue to monitor.   Hemoglobin 10.4 on 8/8  Continue to monitor 15. Constipation: Resolved. 16.  Left foot drop reviewed notes.  Will proceed with  outpatient EMG if necessary. 17. Blood pressure  129/68-155/98 Blood pressure is variable.  Continue to follow 18. Urinary frequency  Seems improved  LOS (Days) 6 A FACE TO FACE EVALUATION WAS PERFORMED  [name] [name] 08/25/2017, 8:27 AM

## 2017-08-25 NOTE — Progress Notes (Signed)
Occupational Therapy Session Note  Patient Details  Name: [name] MRN: [id number] Date of Birth: 03/10/1935  Today's Date: 08/25/2017 OT Individual Time: 1003-1100 OT Individual Time Calculation (min): 57 min    Short Term Goals: Week 1:  OT Short Term Goal 1 (Week 1): Pt will perform toilet transfer with mod A.  OT Short Term Goal 2 (Week 1): Pt will perform LB dressing with mod A.  OT Short Term Goal 3 (Week 1): Pt will maintain min A standing balance during LB clothing management.  OT Short Term Goal 4 (Week 1): Pt will demonstrate self ROM HEP with min verbal cues for technique and paper hand out as needed.   Skilled Therapeutic Interventions/Progress Updates:    1:1. Pt received in supine agreeable to bathing/dressing EOB/bed level d/t low BP/fatigue. Pt bathes LB/peri area in supine with set up rolling B with cues for brings L shoulder over when rolling L. OT threads BLE into disposable brief and shorts with totla A and Pt bridges hips for OT to advance brief past hips. Pt able to roll B and incorporate LUE use to advance shorts past hips. OT dons teds and non slip socks. Pt supine>sitting with touching A and VC for sequencing. Pt pt washes UB sitting EOB with HOH A to wash RUE with LUE for NMR. Pt dons shirt with min cueing for hemi techniques and A to pull shirt past IV on R UE. Pt completes squat pivot transfer to R with mod A EOB>w/c and increased time for pt to problems olve hand placement. Pt completes grooming at sink with supervision. Exited session with pt seated in w/c, call light inr each and all need smet  BP monitored as follows Supine with teds:116/75 Sitting EOB with teds 98/64 Rest EOB ~4 min: 124/68 Sitting in w/c with teds and abd binder: 101/70 (reporting no symptoms)  Therapy Documentation Precautions:  Precautions Precautions: Fall Restrictions Weight Bearing Restrictions: No  See Function Navigator for Current Functional Status.   Therapy/Group:  Individual Therapy  Tonny Branch 08/25/2017, 10:55 AM

## 2017-08-26 ENCOUNTER — Inpatient Hospital Stay (HOSPITAL_COMMUNITY): Payer: Medicare Other

## 2017-08-26 DIAGNOSIS — I10 Essential (primary) hypertension: Secondary | ICD-10-CM

## 2017-08-26 DIAGNOSIS — I725 Aneurysm of other precerebral arteries: Secondary | ICD-10-CM

## 2017-08-26 DIAGNOSIS — E785 Hyperlipidemia, unspecified: Secondary | ICD-10-CM

## 2017-08-26 DIAGNOSIS — N184 Chronic kidney disease, stage 4 (severe): Secondary | ICD-10-CM

## 2017-08-26 LAB — URINALYSIS, ROUTINE W REFLEX MICROSCOPIC
BILIRUBIN URINE: NEGATIVE
Glucose, UA: NEGATIVE mg/dL
Ketones, ur: NEGATIVE mg/dL
Leukocytes, UA: NEGATIVE
NITRITE: NEGATIVE
PROTEIN: 100 mg/dL — AB
SPECIFIC GRAVITY, URINE: 1.015 (ref 1.005–1.030)
pH: 5 (ref 5.0–8.0)

## 2017-08-26 LAB — BASIC METABOLIC PANEL
Anion gap: 9 (ref 5–15)
BUN: 53 mg/dL — ABNORMAL HIGH (ref 8–23)
CHLORIDE: 110 mmol/L (ref 98–111)
CO2: 18 mmol/L — ABNORMAL LOW (ref 22–32)
Calcium: 8.5 mg/dL — ABNORMAL LOW (ref 8.9–10.3)
Creatinine, Ser: 3.31 mg/dL — ABNORMAL HIGH (ref 0.61–1.24)
GFR calc non Af Amer: 16 mL/min — ABNORMAL LOW (ref >60)
GFR, EST AFRICAN AMERICAN: 19 mL/min — AB (ref >60)
Glucose, Bld: 127 mg/dL — ABNORMAL HIGH (ref 70–99)
POTASSIUM: 4.3 mmol/L (ref 3.5–5.1)
SODIUM: 137 mmol/L (ref 135–145)

## 2017-08-26 NOTE — Progress Notes (Signed)
Physical Therapy Session Note  Patient Details  Name: [name] MRN: [id number] Date of Birth: August 22, 1935  Today's Date: 08/26/2017 PT Individual Time: 1345-1500 PT Individual Time Calculation (min): 75 min  and Today's Date: 08/26/2017 PT Missed Time: 60 Minutes Missed Time Reason: Patient fatigue(dizziness/weakness)  Short Term Goals: Week 1:  PT Short Term Goal 1 (Week 1): Pt will transfer bed<>chair w/ min assist.  PT Short Term Goal 2 (Week 1): Pt will ambulate 43' w/ mod assist w/ LRAD. PT Short Term Goal 3 (Week 1): Pt will demonstrate neutral midline orientation during all functional mobility 50% of the time. PT Short Term Goal 4 (Week 1): Pt will demonstrate appropriate safety awareness during functional mobility 50% of the time.  Skilled Therapeutic Interventions/Progress Updates:    Session 1: Pt supine in bed upon PT arrival, declines therapy tx this session secondary to headache and dizziness. Pt reports he feels too weak to participate, vitals WNL. This PT will follow up later today for second session. Pt missed 60 minutes of skilled therapy tx.   Session 2:  Pt supine in bed upon PT arrival, agreeable to therapy tx and denies pain. BP in supine at start of session 140/67. Therapist assisted pt to don teds, abdominal binder and shorts. Pt performed x 10 bridges with B LEs for strengthening and neuro re-ed. Pt transferred from supine>sitting EOB with min assist, reports dizziness, BP in sitting 129/71 and then 118/75 after sitting x 3 min. While in sitting pt worked on seated balance and L UE/LE exercises for neuro re-ed including 2 x 10 L LAQ and 2 x 10 L UE bicep curls. Pt reports decrease in dizziness now that he has been sitting up and states he feels much better. Pt performed sit<>stand from EOB with min assist and RW, BP monitored in standing 106/61. Therapist communicated with RN about pts orthostatic hypotension even with thigh teds and abdominal binders on. RN recommended  supine bed level exercises this session secondary to BP issues. Pt transferred from sitting>supine with min assist, BP monitored 107/78. Pt performed supine exercises for strengthening and L neuro re-ed including 2 x 10 of each: SAQ, heel slides, SLR, hip abduction, bridges, bicep curls, and shoulder flexion. BP checked at end of session while pt in supine: 124/71. Pt left supine with needs in reach and bed alarm set, family present.   Therapy Documentation Precautions:  Precautions Precautions: Fall Restrictions Weight Bearing Restrictions: No   See Function Navigator for Current Functional Status.   Therapy/Group: Individual Therapy  Netta Corrigan, PT, DPT 08/26/2017, 7:49 AM

## 2017-08-26 NOTE — Progress Notes (Signed)
Subjective/Complaints: Pt c/o dizziness this am, mild nausea no abd pain, feels a little weaker LUE  ROS:  Denies CP SOB N/V/D  Objective: Vital Signs: Blood pressure (!) 127/101, pulse 99, temperature 99.9 F (37.7 C), temperature source Oral, resp. rate 18, height 5\' 9"  (1.753 m), weight 93.1 kg, SpO2 95 %. No results found. Results for orders placed or performed during the hospital encounter of 08/19/17 (from the past 72 hour(s))  Glucose, capillary     Status: None   Collection Time: 08/23/17 11:21 AM  Result Value Ref Range   Glucose-Capillary 83 70 - 99 mg/dL  Glucose, capillary     Status: Abnormal   Collection Time: 08/23/17  4:34 PM  Result Value Ref Range   Glucose-Capillary 112 (H) 70 - 99 mg/dL  Occult blood card to lab, stool RN will collect     Status: Abnormal   Collection Time: 08/23/17  6:16 PM  Result Value Ref Range   Fecal Occult Bld POSITIVE (A) NEGATIVE    Comment: Performed at [facility], [place] [address] [place], [place] [address]  Glucose, capillary     Status: Abnormal   Collection Time: 08/23/17  9:43 PM  Result Value Ref Range   Glucose-Capillary 106 (H) 70 - 99 mg/dL   Comment 1 Notify RN   Glucose, capillary     Status: None   Collection Time: 08/24/17  7:31 AM  Result Value Ref Range   Glucose-Capillary 99 70 - 99 mg/dL  Occult blood card to lab, stool     Status: Abnormal   Collection Time: 08/24/17 12:34 PM  Result Value Ref Range   Fecal Occult Bld POSITIVE (A) NEGATIVE    Comment: Performed at [facility], [place] [address] [place], [place] [address]  Glucose, capillary     Status: Abnormal   Collection Time: 08/25/17  4:24 PM  Result Value Ref Range   Glucose-Capillary 117 (H) 70 - 99 mg/dL     HENT: Normocephalic, atraumatic Eyes: EOMI. No discharge. Cardio: RRR. No JVD. Resp: CTA B/L and unlabored GI: BS and ND Skin: Warm and dry. Intact. Neuro: Alert/Oriented Motor: Motor: Left delt 2/5, bi, tri 2+/5, grip 3+/5   LLE: 4+/5 proximal 2/5 distally Musc/Skel:  No edema or tenderness in extremities Gen NAD. Vital signs reviewed   Assessment/Plan: 1. 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 Physiatrist is providing close team supervision and 24 hour management of active medical problems listed below. Physiatrist and rehab team continue to assess barriers to discharge/monitor patient progress toward functional and medical goals. FIM: Function - Bathing Bathing activity did not occur: Refused Position: Shower Body parts bathed by patient: Left arm, Chest, Abdomen, Front perineal area, Right upper leg, Left upper leg, Right lower leg, Left lower leg Body parts bathed by helper: Right arm, Buttocks, Back Assist Level: Touching or steadying assistance(Pt > 75%)  Function- Upper Body Dressing/Undressing What is the patient wearing?: Hospital gown Pull over shirt/dress - Perfomed by patient: Put head through opening, Pull shirt over trunk Pull over shirt/dress - Perfomed by helper: Thread/unthread right sleeve, Thread/unthread left sleeve Button up shirt - Perfomed by patient: Thread/unthread right sleeve, Thread/unthread left sleeve, Pull shirt around back Button up shirt - Perfomed by helper: Button/unbutton shirt Assist Level: Touching or steadying assistance(Pt > 75%) Function - Lower Body Dressing/Undressing What is the patient wearing?: Pants, Shoes, [employer]  Position: Sitting EOB Pants- Performed by patient: Thread/unthread right pants leg, Thread/unthread left pants leg Pants- Performed by helper: Fasten/unfasten pants, Pull pants up/down Non-skid slipper socks- Performed by helper: Don/doff right sock, Don/doff left sock Socks - Performed by patient: Don/doff right sock Socks - Performed by helper: Don/doff right sock, Don/doff left sock Shoes - Performed by patient: Don/doff  right shoe, Don/doff left shoe Shoes - Performed by helper: Fasten right, Fasten left TED Hose - Performed by helper: Don/doff right TED hose, Don/doff left TED hose Assist for footwear: Partial/moderate assist Assist for lower body dressing: Touching or steadying assistance (Pt > 75%)  Function - Toileting Toileting steps completed by helper: Adjust clothing prior to toileting, Performs perineal hygiene, Adjust clothing after toileting Toileting Assistive Devices: Grab bar or rail Assist level: Two helpers  Function - Air cabin crew transfer assistive device: Elevated toilet seat/BSC over toilet Assist level to toilet: Moderate assist (Pt 50 - 74%/lift or lower) Assist level from toilet: Dependent (Pt equals 0%)(obtained stedy for assist from toilet > shower)  Function - Chair/bed transfer Chair/bed transfer method: Stand pivot Chair/bed transfer assist level: Touching or steadying assistance (Pt > 75%) Chair/bed transfer assistive device: Armrests, Bedrails, Walker Chair/bed transfer details: Verbal cues for technique, Verbal cues for precautions/safety, Manual facilitation for placement, Verbal cues for safe use of DME/AE  Function - Locomotion: Wheelchair Will patient use wheelchair at discharge?: (TBD) Type: Manual Max wheelchair distance: 150' Assist Level: Supervision or verbal cues Assist Level: Supervision or verbal cues Wheel 150 feet activity did not occur: Safety/medical concerns Assist Level: Supervision or verbal cues Turns around,maneuvers to table,bed, and toilet,negotiates 3% grade,maneuvers on rugs and over doorsills: No Function - Locomotion: Ambulation Assistive device: Rail in hallway Max distance: 10' Assist level: Maximal assist (Pt 25 - 49%) Assist level: Maximal assist (Pt 25 - 49%) Walk 50 feet with 2 turns activity did not occur: Safety/medical concerns Walk 150 feet activity did not occur: Safety/medical concerns Walk 10 feet on uneven  surfaces activity did not occur: Safety/medical concerns  Function - Comprehension Comprehension: Auditory Comprehension assist level: Follows complex conversation/direction with extra time/assistive device  Function - Expression Expression: Verbal Expression assist level: Expresses complex ideas: With extra time/assistive device  Function - Social Interaction Social Interaction assist level: Interacts appropriately 90% of the time - Needs monitoring or encouragement for participation or interaction.  Function - Problem Solving Problem solving assist level: Solves basic problems with no assist  Function - Memory Memory assist level: Recognizes or recalls 75 - 89% of the time/requires cueing 10 - 24% of the time Patient normally able to recall (first 3 days only): Current season, Location of own room, Staff names and faces, That he or she is in a hospital  Medical Problem List and Plan: 1.  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  Similar symptoms as last weak when repeat CT and MRI demonstarted no new stroke, neuro felt it may be TIA vs  Hypoperfusion, BP normal but running lower that ove the last couple days, will hold Lopressor this am, may do therapy.  Will ask  Neuro for further rec   2.  DVT Prophylaxis/Anticoagulation: Pharmaceutical: Lovenox 3. Idiopathic neuropathy/Pain Management: Gabapentin was added for LUE dysesthesias. Continue ultram prn as at home.  4. Mood: LCSW to follow for evaluation and support.  5. Neuropsych: This patient is capable of making  decisions on his own behalf. 6. Skin/Wound Care: routine pressure relief measures.  7. Fluids/Electrolytes/Nutrition: Monitor I/O.  8. COPD: Increased albuterol to every 4 hours prn. Continue Onoro. Encourage IS to help with atelectasis 9.  Acute on chronic renal failure--stage  IV:  Baseline SCr-3.65. On calcitriol for secondary hyperparathyroidism. Continue to monitor with serial checks. Avoid ARB/ No ACE due to allergy.   Cr. 3.53 on 8/8  Encourage fluid intake           IVF daily at bedtime started on 8/8 Await labs 10. H/o gastritis due to NSAIDs/ GERD: Continue PPI.  11. Dyslipidemia: on Lipitor.  12. CAD/AS: Treated medically with metoprolol, ASA and lipitor.  13. Peri anal cyst:  With acute on chronic inflammation. No prior treatment per reports. Started on Augmentin on 8/3 for  7-10 days treatment. Continue moist warm compresses to help drain.    14. ABLA on Anemia of chronic disease: Hgb- 11.2-12.5 range. Continue to monitor.   Hemoglobin 10.4 on 8/8  Continue to monitor 15. Constipation: Scheduled Senna S 2 at bedtime. 16.  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 Multifocal neuropathy, hx of sciatica, may benefit from outpt EMG to further evaluate 17. Blood pressure- will have goal of 130-140s- enc fluid hold lopressor for Sys BP <130  Orthostatics negative on 8/12, lying to sitting 18. Urinary frequency  At night, likely related to IVF  PVRs ordered  Condom cath qhs until PVRs/IVF  LOS (Days) 7 A FACE TO FACE EVALUATION WAS PERFORMED  Charlett Blake 08/26/2017, 8:14 AM

## 2017-08-26 NOTE — Progress Notes (Signed)
Patient reporting headache and weakness in AM. Rested in bed, drank 6 cups of water. Ate late breakfast then lunch. Felt better prior to 1300 therapy. Worked with PT but some transient dizziness noted and BP dropped to Sys 98 after standing. Patient wearing TED hose and ABD binder at the time. Patient stated he felt well and had better mobility of L arm during therapy session. BP remain in 100-110 range. PA made aware. Patient resting in bed in no acute distress. Will continue to push fluids and monitor.

## 2017-08-26 NOTE — Progress Notes (Signed)
Occupational Therapy Note  Patient Details  Name: [name] MRN: [id number] Date of Birth: 1935-05-04  Today's Date: 08/26/2017 OT Missed Time: 41 Minutes Missed Time Reason: Patient fatigue;Patient ill (comment)  Pt missed 60 mins skilled OT services.  Pt in bed upon arrival and stated he had a "terrible" headache behind his eyes. BP 147/93, HR 91, O2 98, Temp 98.2. Pt unable to participate stating he "just felt sick." RN notified.   Leotis Shames [facility] 08/26/2017, 7:29 AM

## 2017-08-27 ENCOUNTER — Inpatient Hospital Stay (HOSPITAL_COMMUNITY): Payer: Medicare Other

## 2017-08-27 ENCOUNTER — Inpatient Hospital Stay (HOSPITAL_COMMUNITY): Payer: Self-pay | Admitting: Physical Therapy

## 2017-08-27 ENCOUNTER — Inpatient Hospital Stay (HOSPITAL_COMMUNITY): Payer: Self-pay

## 2017-08-27 MED ORDER — METOPROLOL TARTRATE 12.5 MG HALF TABLET
12.5000 mg | ORAL_TABLET | Freq: Two times a day (BID) | ORAL | Status: DC
  Administered 2017-08-27 – 2017-08-31 (×8): 12.5 mg via ORAL
  Filled 2017-08-27 (×8): qty 1

## 2017-08-27 NOTE — Progress Notes (Signed)
Physical Therapy Note  Patient Details  Name: [name] MRN: [id number] Date of Birth: [birth year] Today's Date: 08/27/2017    Pt's plan of care adjusted to 15/7 after speaking with care team and discussed with PA-C as pt currently unable to tolerate current therapy schedule with OT and PT secondary to BP issues.   Netta Corrigan, PT, DPT 08/27/2017, 12:06 PM

## 2017-08-27 NOTE — Plan of Care (Signed)
  Problem: RH BOWEL ELIMINATION Goal: RH STG MANAGE BOWEL WITH ASSISTANCE Description STG Manage Bowel with Assistance. Min  Outcome: Progressing   Problem: RH BLADDER ELIMINATION Goal: RH STG MANAGE BLADDER WITH ASSISTANCE Description STG Manage Bladder With Assistance. Min  Outcome: Progressing   Problem: RH SKIN INTEGRITY Goal: RH STG MAINTAIN SKIN INTEGRITY WITH ASSISTANCE Description STG Maintain Skin Integrity With Assistance. No breakdown or infection while on rehab with min assist  Outcome: Progressing   Problem: RH SAFETY Goal: RH STG ADHERE TO SAFETY PRECAUTIONS W/ASSISTANCE/DEVICE Description STG Adhere to Safety Precautions With Assistance/Device. Min  Outcome: Progressing   Problem: RH KNOWLEDGE DEFICIT Goal: RH STG INCREASE KNOWLEDGE OF HYPERTENSION Description Patient and family will be able to describe medications to control HTN and describe target blood pressure parameters with min cues.   Outcome: Progressing

## 2017-08-27 NOTE — Progress Notes (Signed)
Physical Therapy Weekly Progress Note  Patient Details  Name: [name] MRN: [id number] Date of Birth: [birth year]  Beginning of progress report period: August 20, 2017 End of progress report period: August 27, 2017  Today's Date: 08/27/2017 PT Individual Time: 1000-1030 and 1615-1700 PT Individual Time Calculation (min): 30 min  And 45 min (make-up) and Today's Date: 08/27/2017 PT Missed Time: 30 Minutes Missed Time Reason: Other (Comment)(dizziness, low BP)  Patient has met 3 of 4 short term goals.  Pt has not met his gait goal secondary to BP issues, he has not been able to tolerate standing activity therefore this therapist has not reassessed gait. The pt does demonstrate improved strength overall on the left side and decreased pushing tendencies in standing. This pt is most limited by his low blood pressure affecting all mobility and will benefit a 15/7 therapy schedule to allow for increased rest breaks between therapies.   Patient continues to demonstrate the following deficits muscle weakness, decreased coordination and decreased motor planning, central origin and decreased standing balance, decreased postural control, hemiplegia and decreased balance strategies and therefore will continue to benefit from skilled PT intervention to increase functional independence with mobility.  Patient progressing toward long term goals..  Plan of care revisions: pt has been reduced to 15/7 therapist secondary to low blood pressure and fatigue. Marland Kitchen  PT Short Term Goals Week 1:  PT Short Term Goal 1 (Week 1): Pt will transfer bed<>chair w/ min assist.  PT Short Term Goal 1 - Progress (Week 1): Met PT Short Term Goal 2 (Week 1): Pt will ambulate 5' w/ mod assist w/ LRAD. PT Short Term Goal 2 - Progress (Week 1): Progressing toward goal PT Short Term Goal 3 (Week 1): Pt will demonstrate neutral midline orientation during all functional mobility 50% of the time. PT Short Term Goal 3 - Progress (Week 1):  Met PT Short Term Goal 4 (Week 1): Pt will demonstrate appropriate safety awareness during functional mobility 50% of the time. PT Short Term Goal 4 - Progress (Week 1): Met Week 2:  PT Short Term Goal 1 (Week 2): STG=LTG due to ELOS  Skilled Therapeutic Interventions/Progress Updates:    Session 1: Pt seated in w/c upon PT arrival, agreeable to therapy tx and denies pain. Therapist checked BP with pt seated in w/c, 94/55. Therapist made RN aware of low BP even with pt wearing thigh high teds and abdominal binder. Pt performed UE flexion x 10 bilaterally and LE LAQs x20 bilaterally, trying to raise BP. Checked again in sitting 113/68. Pt performed x20 LAQ bilateraaly, x 20 seated marches and x 10 UE flexion for strengthening. BP checked again in sitting 100/60. Pt reports now some dizziness/light headedness. Therapist discussed symptoms and low blood pressure with PA, he recommended getting back to bed and holding therapy this session until BP comes back up. Squat pivot back to bed with min assist and verbal cues for techniques. Pt transferred sitting>supine with supervision. Pt left supine in bed with needs in reach and bed alarm set. Pt missed 30 minutes of skilled therapy tx this session secondary to low BP.  Session 2: Pt supine in bed upon PT arrival, agreeable to therapy tx and denies pain. BP 124/73 in supine. Pt transferred to sitting EOB with supervision, BP 127/75 in sitting. Pt reports dizziness from supine>sitting transition. Pt seated EOB participated in vestibular testing this session: pt with overshooting during horizontal saccades to the R and with overshooting during vertical saccades; negative  for nystagmus with Head Impulse Test however pt reports increase in dizziness, (-) head shaking nystagmus test, (+) VOR cancellation test. BP 124/77 after sitting for 20 minutes EOB during vestibular testing. Pt performed sit<>stand with RW and min assist, pt reports increased dizziness however no  orthostatic hypotension noted with BP 135/74. Pt performed sit<>stand with RW and min assist, using visual fixation strategy to minimize dizziness, pt reports no dizziness this time with BP 130/70. Therapist educated pt in x1 vestibular exercise and provided handout. Pt transferred to supine at end of session and left with needs in reach and bed alarm set, family present.     Therapy Documentation Precautions:  Precautions Precautions: Fall Restrictions Weight Bearing Restrictions: No   See Function Navigator for Current Functional Status.  Therapy/Group: Individual Therapy  Netta Corrigan, PT, DPT 08/27/2017, 7:51 AM

## 2017-08-27 NOTE — Progress Notes (Signed)
Recreational Therapy Session Note  Patient Details  Name: [name] MRN: [id number] Date of Birth: 04-08-1935 Today's Date: 08/27/2017  Pt evaluated 8/6 but since then has had difficulty participating in therapies due to c/o dizziness and low BP.  Will HOLD TR services for now and continue to monitor through team for future participation. [place] 08/27/2017, 9:11 AM

## 2017-08-27 NOTE — Progress Notes (Signed)
Physical Therapy Session Note  Patient Details  Name: [name] MRN: [id number] Date of Birth: [birth year]  Today's Date: 08/27/2017 PT Individual Time: 5573-2202 PT Individual Time Calculation (min): 40 min   Short Term Goals: Week 2:  PT Short Term Goal 1 (Week 2): STG=LTG due to ELOS  Skilled Therapeutic Interventions/Progress Updates:   Pt in supine and agreeable to therapy but reports continued fatigue and a headache, RN aware and providing pain medication. Session focused on tolerance to upright and education on positioning for BP management. BP 133/85 in supine and 117/72 after transferring to EOB w/ c/o dizziness. Min guard for safety. Dizziness slowly subsiding, BP 126/76 after a few minutes and 135/80 after 15 min of upright sitting. Dizziness subsided after 15 minutes. Abdominal binder and TED hose donned the entire time sitting up as well. Educated pt and family members on effects of body position on BP and perfusion of brain. Discussed importance of gradual building up of upright tolerance and responding appropriately when BP does go down - return to supine. All appreciative and verbalized understanding. Returned to supine to rest before next session at 415pm, BP 139/81 in supine. Call bell within reach and all needs met.   Therapy Documentation Precautions:  Precautions Precautions: Fall Restrictions Weight Bearing Restrictions: No Vital Signs: Therapy Vitals Temp: 98.4 F (36.9 C) Temp Source: Oral Pulse Rate: 74 Resp: 16 BP: 117/71 Patient Position (if appropriate): Lying Oxygen Therapy SpO2: 95 % O2 Device: Room Air  See Function Navigator for Current Functional Status.   Therapy/Group: Individual Therapy  [name] K Arnette 08/27/2017, 3:58 PM

## 2017-08-27 NOTE — Progress Notes (Signed)
Occupational Therapy Session Note  Patient Details  Name: [name] MRN: [id number] Date of Birth: 13-Dec-1935  Today's Date: 08/27/2017 OT Individual Time: 2595-6387 OT Individual Time Calculation (min): 57 min    Short Term Goals: Week 1:  OT Short Term Goal 1 (Week 1): Pt will perform toilet transfer with mod A.  OT Short Term Goal 2 (Week 1): Pt will perform LB dressing with mod A.  OT Short Term Goal 3 (Week 1): Pt will maintain min A standing balance during LB clothing management.  OT Short Term Goal 4 (Week 1): Pt will demonstrate self ROM HEP with min verbal cues for technique and paper hand out as needed.   Skilled Therapeutic Interventions/Progress Updates:    Pt received supine in bed agreeable to therapy with no c/o pain. Pt making great progress toward goals this session, with an improvement from previous day. Session focused on dynamic balance and transfers during morning ADL routine. Pt transitioned to EOB with use of bed features and CGA. Pt sat EOB and completed UB b/d with no LOB, and set up overall. Intermittent vc for L UE purposeful use throughout session. Pt threaded B LE through shorts sitting EOB with CGA for sitting balance. Pt stood with RW, requiring min lifting A and mod A for pulling up pants in standing. Mod A standing balance support provided when removing R UE from RW. Pt experienced 3 LOB during sitting when attempting to pull R/L LE into figure 4 technique for therapist to don teds. Pt completed stand pivot transfer to w/c with RW and mod A. Pt sat at sink and performed grooming tasks with vc/tactile cues provided to perform several tasks bimanually. Pt was left sitting up in w/c with all needs met and belt alarm applied.   Therapy Documentation Precautions:  Precautions Precautions: Fall Restrictions Weight Bearing Restrictions: No   Vital Signs: Oxygen Therapy SpO2: 97 % O2 Device: Room Air Pain: Pain Assessment Pain Scale: 0-10 Pain Score: 0-No  pain  See Function Navigator for Current Functional Status.   Therapy/Group: Individual Therapy  Curtis Sites 08/27/2017, 9:58 AM

## 2017-08-27 NOTE — Progress Notes (Signed)
Occupational Therapy Session Note  Patient Details  Name: [name] MRN: [id number] Date of Birth: 12-22-1935  Today's Date: 08/27/2017 OT Individual Time: 1300-1330 OT Individual Time Calculation (min): 30 min  and Today's Date: 08/27/2017 OT Missed Time: 30 Minutes Missed Time Reason: Other (comment)(low BP and increased dizzyness)   Short Term Goals: Week 1:  OT Short Term Goal 1 (Week 1): Pt will perform toilet transfer with mod A.  OT Short Term Goal 2 (Week 1): Pt will perform LB dressing with mod A.  OT Short Term Goal 3 (Week 1): Pt will maintain min A standing balance during LB clothing management.  OT Short Term Goal 4 (Week 1): Pt will demonstrate self ROM HEP with min verbal cues for technique and paper hand out as needed.   Skilled Therapeutic Interventions/Progress Updates:    Pt resting in bed upon arrival with family and friends present.  Pt agreeable to therapy.  BP supine 126/78.  Pt sat EOB with supervision but required steady A for sitting balance.  BP sitting 109/73 with report of increased dizziness resolving slightly while seated (Abd binder and Ted hose donned). Pt performed lateral scooting with semi squats toward HOB with increased dizziness.  Pt reports room spinning and unable to focus on objects in room.  Pt returned to supine with even greater increase in dizziness and inability to focus on objects in room.  Pt remained in bed.  PA notified. Discussion with PT and possible vestibular disturbance.  Pt missed 30 mins skilled OT services 2/2 dizziness and unable to participate.   Therapy Documentation Precautions:  Precautions Precautions: Fall Restrictions Weight Bearing Restrictions: No General: General OT Amount of Missed Time: 30 Minutes PT Missed Treatment Reason: Other (Comment)(dizziness, low BP) Pain: Pain Assessment Pain Scale: 0-10 Pain Score: 0-No pain  See Function Navigator for Current Functional Status.   Therapy/Group: Individual  Therapy  Leroy Libman 08/27/2017, 1:35 PM

## 2017-08-27 NOTE — Progress Notes (Signed)
Subjective/Complaints: Pt c/o dizziness this am, mild nausea no abd pain, feels a little weaker LUE  ROS:  Denies CP SOB N/V/D  Objective: Vital Signs: Blood pressure 113/68, pulse 76, temperature 97.6 F (36.4 C), temperature source Oral, resp. rate 16, height '5\' 9"'  (1.753 m), weight 93.1 kg, SpO2 97 %. Dg Chest Port 1 View  Result Date: 08/26/2017 CLINICAL DATA:  Fever EXAM: PORTABLE CHEST 1 VIEW COMPARISON:  08/19/2017, 08/13/2017, CT chest 11/08/2016 FINDINGS: No acute opacity or pleural effusion. Normal cardiomediastinal silhouette. Minimal scarring or atelectasis at the left base. No pneumothorax. IMPRESSION: No active disease. Electronically Signed   By: Donavan Foil M.D.   On: 08/26/2017 16:44   Results for orders placed or performed during the hospital encounter of 08/19/17 (from the past 72 hour(s))  Occult blood card to lab, stool     Status: Abnormal   Collection Time: 08/24/17 12:34 PM  Result Value Ref Range   Fecal Occult Bld POSITIVE (A) NEGATIVE    Comment: Performed at [facility], [address] [place], [place] [address]  Glucose, capillary     Status: Abnormal   Collection Time: 08/25/17  4:24 PM  Result Value Ref Range   Glucose-Capillary 117 (H) 70 - 99 mg/dL  Basic metabolic panel     Status: Abnormal   Collection Time: 08/26/17 12:30 PM  Result Value Ref Range   Sodium 137 135 - 145 mmol/L   Potassium 4.3 3.5 - 5.1 mmol/L   Chloride 110 98 - 111 mmol/L   CO2 18 (L) 22 - 32 mmol/L   Glucose, Bld 127 (H) 70 - 99 mg/dL   BUN 53 (H) 8 - 23 mg/dL   Creatinine, Ser 3.31 (H) 0.61 - 1.24 mg/dL   Calcium 8.5 (L) 8.9 - 10.3 mg/dL   GFR calc non Af Amer 16 (L) >60 mL/min   GFR calc Af Amer 19 (L) >60 mL/min    Comment: (NOTE) The eGFR has been calculated using the CKD EPI equation. This calculation has not been validated in all clinical situations. eGFR's persistently <60 mL/min signify possible Chronic Kidney Disease.    Anion gap 9 5 - 15     Comment: Performed at [facility] [address], [place], [place] [address]  Urinalysis, Routine w reflex microscopic     Status: Abnormal   Collection Time: 08/26/17  6:51 PM  Result Value Ref Range   Color, Urine YELLOW YELLOW   APPearance CLEAR CLEAR   Specific Gravity, Urine 1.015 1.005 - 1.030   pH 5.0 5.0 - 8.0   Glucose, UA NEGATIVE NEGATIVE mg/dL   Hgb urine dipstick SMALL (A) NEGATIVE   Bilirubin Urine NEGATIVE NEGATIVE   Ketones, ur NEGATIVE NEGATIVE mg/dL   Protein, ur 100 (A) NEGATIVE mg/dL   Nitrite NEGATIVE NEGATIVE   Leukocytes, UA NEGATIVE NEGATIVE   RBC / HPF 0-5 0 - 5 RBC/hpf   WBC, UA 0-5 0 - 5 WBC/hpf   Bacteria, UA RARE (A) NONE SEEN   Squamous Epithelial / LPF 0-5 0 - 5    Comment: Performed at [facility], [address] [place], [place] [address]     HENT: Normocephalic, atraumatic Eyes: EOMI. No discharge. Cardio: RRR. No JVD. Resp: CTA B/L and unlabored GI: BS and ND Skin: Warm and dry. Intact. Neuro: Alert/Oriented Motor: Motor: Left delt 3-/5, bi, tri 2+/5, grip 3+/5  LLE: 4+/5 proximal 2/5 distally Musc/Skel:  No edema or tenderness in extremities  Gen NAD. Vital signs reviewed   Assessment/Plan: 1. 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 Physiatrist is providing close team supervision and 24 hour management of active medical problems listed below. Physiatrist and rehab team continue to assess barriers to discharge/monitor patient progress toward functional and medical goals. FIM: Function - Bathing Bathing activity did not occur: Refused Position: Shower Body parts bathed by patient: Left arm, Chest, Abdomen, Front perineal area, Right upper leg, Left upper leg, Right lower leg, Left lower leg Body parts bathed by helper: Right arm, Buttocks, Back Assist Level: Touching or steadying assistance(Pt >  75%)  Function- Upper Body Dressing/Undressing What is the patient wearing?: Hospital gown Pull over shirt/dress - Perfomed by patient: Put head through opening, Pull shirt over trunk Pull over shirt/dress - Perfomed by helper: Thread/unthread right sleeve, Thread/unthread left sleeve Button up shirt - Perfomed by patient: Thread/unthread right sleeve, Thread/unthread left sleeve, Pull shirt around back Button up shirt - Perfomed by helper: Button/unbutton shirt Assist Level: Touching or steadying assistance(Pt > 75%) Function - Lower Body Dressing/Undressing What is the patient wearing?: Pants, Shoes, Ted Hose Position: Sitting EOB Pants- Performed by patient: Thread/unthread right pants leg, Thread/unthread left pants leg Pants- Performed by helper: Fasten/unfasten pants, Pull pants up/down Non-skid slipper socks- Performed by helper: Don/doff right sock, Don/doff left sock Socks - Performed by patient: Don/doff right sock Socks - Performed by helper: Don/doff right sock, Don/doff left sock Shoes - Performed by patient: Don/doff right shoe, Don/doff left shoe Shoes - Performed by helper: Fasten right, Fasten left TED Hose - Performed by helper: Don/doff right TED hose, Don/doff left TED hose Assist for footwear: Partial/moderate assist Assist for lower body dressing: Touching or steadying assistance (Pt > 75%)  Function - Toileting Toileting steps completed by patient: Adjust clothing prior to toileting, Adjust clothing after toileting Toileting steps completed by helper: Adjust clothing prior to toileting, Performs perineal hygiene, Adjust clothing after toileting Toileting Assistive Devices: Grab bar or rail, Toilet aid(Urinal) Assist level: Set up/obtain supplies, More than reasonable time, Two helpers  Function - Air cabin crew transfer assistive device: Elevated toilet seat/BSC over toilet Assist level to toilet: Moderate assist (Pt 50 - 74%/lift or lower) Assist level  from toilet: Dependent (Pt equals 0%)(obtained stedy for assist from toilet > shower)  Function - Chair/bed transfer Chair/bed transfer method: Squat pivot Chair/bed transfer assist level: Touching or steadying assistance (Pt > 75%) Chair/bed transfer assistive device: Armrests, Bedrails, Walker Chair/bed transfer details: Verbal cues for technique, Verbal cues for precautions/safety, Manual facilitation for placement, Verbal cues for safe use of DME/AE  Function - Locomotion: Wheelchair Will patient use wheelchair at discharge?: (TBD) Type: Manual Max wheelchair distance: 150' Assist Level: Supervision or verbal cues Assist Level: Supervision or verbal cues Wheel 150 feet activity did not occur: Safety/medical concerns Assist Level: Supervision or verbal cues Turns around,maneuvers to table,bed, and toilet,negotiates 3% grade,maneuvers on rugs and over doorsills: No Function - Locomotion: Ambulation Assistive device: Rail in hallway Max distance: 10' Assist level: Maximal assist (Pt 25 - 49%) Assist level: Maximal assist (Pt 25 - 49%) Walk 50 feet with 2 turns activity did not occur: Safety/medical concerns Walk 150 feet activity did not occur: Safety/medical concerns Walk 10 feet on uneven surfaces activity did not occur: Safety/medical concerns  Function - Comprehension Comprehension: Auditory Comprehension assist level: Follows complex conversation/direction with extra time/assistive device  Function - Expression  Expression: Verbal Expression assist level: Expresses complex ideas: With extra time/assistive device  Function - Social Interaction Social Interaction assist level: Interacts appropriately 90% of the time - Needs monitoring or encouragement for participation or interaction.  Function - Problem Solving Problem solving assist level: Solves basic problems with no assist  Function - Memory Memory assist level: Recognizes or recalls 90% of the time/requires cueing <  10% of the time Patient normally able to recall (first 3 days only): Current season, Location of own room, Staff names and faces, That he or she is in a hospital  Medical Problem List and Plan: 1.  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  Similar symptoms as last weak when repeat CT and MRI demonstarted no new stroke, neuro felt it may be TIA vs  Hypoperfusion, BP normal but running lower that over the last couple days, will reduce  Lopressor to 12.46m BID, may do therapy.  Pt back to baseline but if this recurs consider heparin then warfarin- d/w pt he is aware and would like to avoid warfarin if possible   2.  DVT Prophylaxis/Anticoagulation: Pharmaceutical: Lovenox 3. Idiopathic neuropathy/Pain Management: Gabapentin was added for LUE dysesthesias. Continue ultram prn as at home.  4. Mood: LCSW to follow for evaluation and support.  5. Neuropsych: This patient is capable of making decisions on his own behalf. 6. Skin/Wound Care: routine pressure relief measures.  7. Fluids/Electrolytes/Nutrition: Monitor I/O.  8. COPD: Increased albuterol to every 4 hours prn. Continue Onoro. Encourage IS to help with atelectasis 9.  Acute on chronic renal failure--stage IV:  Baseline SCr-3.65. On calcitriol for secondary hyperparathyroidism. Continue to monitor with serial checks. Avoid ARB/ No ACE due to allergy.   Cr. 3.53 on 8/8  Encourage fluid intake           IVF daily at bedtime started on 8/8  10. H/o gastritis due to NSAIDs/ GERD: Continue PPI.  11. Dyslipidemia: on Lipitor.  12. CAD/AS: Treated medically with metoprolol, ASA and lipitor.  13. Peri anal cyst:  With acute on chronic inflammation. No prior treatment per reports. Started on Augmentin on 8/3 for  7-10 days treatment. Continue moist warm compresses to help drain.    14. ABLA on Anemia of  chronic disease: Hgb- 11.2-12.5 range. Continue to monitor.   Hemoglobin 10.4 on 8/8 monitor weekly while hospitalized  Stool OB pos,is on Lovenox may need OP f/u with GI, no emergent issues 15. Constipation: Scheduled Senna S 2 at bedtime. 16.  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 Multifocal neuropathy, hx of sciatica, may benefit from outpt EMG to further evaluate 17. Blood pressure- will have goal of 130-140s- enc fluid hold lopressor for Sys BP <130  Orthostatics negative on 8/12, lying to sitting 18. Urinary frequency  At night, likely related to IVF  PVRs ordered  Condom cath qhs until PVRs/IVF  LOS (Days) 8 A FACE TO FACE EVALUATION WAS PERFORMED  ACharlett Blake8/13/2019, 12:26 PM

## 2017-08-27 NOTE — Progress Notes (Signed)
Occupational Therapy Weekly Progress Note  Patient Details  Name: [name] MRN: [id number] Date of Birth: 11/14/1935  Beginning of progress report period: August 20, 2017 End of progress report period: August 27, 2017  Patient has met 3 of 4 short term goals.  Pt progress has been inconsistent secondary to ongoing orthostatic  hypotension and dizziness.  Pt requires mod/max A for LB dressing tasks and mod A for bathing tasks. Pt performs squat pivot transfers with mod A and min verbal cues for sequencing.  Pt noted with increased functional use of LUE during BADLs but continues to required mod verbal cues to initiate and for positioning. Pt reports increased dizziness with positional changes and with orthostatic hypotension. Pt eagerly participates in therapy when low BP and dizziness does not interfere.   Patient continues to demonstrate the following deficits: abnormal posture, cognitive deficits, hemiplegia affecting non-dominant side and coordination disorder and therefore will continue to benefit from skilled OT intervention to enhance overall performance with BADL and Reduce care partner burden.  Patient progressing toward long term goals..  Continue plan of care.  OT Short Term Goals Week 1:  OT Short Term Goal 1 (Week 1): Pt will perform toilet transfer with mod A.  OT Short Term Goal 1 - Progress (Week 1): Met OT Short Term Goal 2 (Week 1): Pt will perform LB dressing with mod A.  OT Short Term Goal 2 - Progress (Week 1): Progressing toward goal OT Short Term Goal 3 (Week 1): Pt will maintain min A standing balance during LB clothing management.  OT Short Term Goal 3 - Progress (Week 1): Met OT Short Term Goal 4 (Week 1): Pt will demonstrate self ROM HEP with min verbal cues for technique and paper hand out as needed.  OT Short Term Goal 4 - Progress (Week 1): Met Week 2:  OT Short Term Goal 1 (Week 2): STG=LTG secondary to ELOS  Therapy Documentation Precautions:   Precautions Precautions: Fall Restrictions Weight Bearing Restrictions: No  See Function Navigator for Current Functional Status.   Leotis Shames [facility] 08/27/2017, 1:43 PM

## 2017-08-28 ENCOUNTER — Inpatient Hospital Stay (HOSPITAL_COMMUNITY): Payer: Self-pay

## 2017-08-28 ENCOUNTER — Inpatient Hospital Stay (HOSPITAL_COMMUNITY): Payer: Medicare Other

## 2017-08-28 ENCOUNTER — Inpatient Hospital Stay (HOSPITAL_COMMUNITY): Payer: Medicare Other | Admitting: Physical Therapy

## 2017-08-28 NOTE — Progress Notes (Signed)
Occupational Therapy Session Note  Patient Details  Name: [name] MRN: [id number] Date of Birth: 11-17-1935  Today's Date: 08/28/2017 OT Individual Time: 4742-5956 OT Individual Time Calculation (min): 55 min    Short Term Goals: Week 2:  OT Short Term Goal 1 (Week 2): STG=LTG secondary to ELOS  Skilled Therapeutic Interventions/Progress Updates:    Pt resting in w/c upon arrival with abdominal binder laying on bed.  Pt stated it was too uncomfortable.  BP seated 119/76 with no s/s.  Pt agreeable to engaging in UB bathing/dressing tasks and shaving.  Pt completed all tasks with [facility] assist for applying deodorant under R axilla. Pt completed shaving tasks without assistance.  Pt became SOB donning shirt and stated his LUE was "giving out." Pt continues to exhibit improved functional use of LUE at gross assist level.  Pt fatigues quickly and requires multiple rest breaks during session. Pt remained in w/c with bel alarm activated and all needs within reach.   Therapy Documentation Precautions:  Precautions Precautions: Fall Restrictions Weight Bearing Restrictions: No Pain: Pain Assessment Pain Scale: 0-10 Pain Score: 0-No pain  See Function Navigator for Current Functional Status.   Therapy/Group: Individual Therapy  Leroy Libman 08/28/2017, 10:29 AM

## 2017-08-28 NOTE — Progress Notes (Signed)
Physical Therapy Session Note  Patient Details  Name: [name] MRN: [id number] Date of Birth: 1935-03-16  Today's Date: 08/28/2017 PT Individual Time: 0800-0900 PT Individual Time Calculation (min): 60 min   Short Term Goals: Week 2:  PT Short Term Goal 1 (Week 2): STG=LTG due to ELOS  Skilled Therapeutic Interventions/Progress Updates:    Pt supine in bed upon PT arrival, agreeable to therapy tx and denies pain. BP checked at start of session in supine, 137/82. Therapist assisted pt to don thigh high teds, socks and shorts. Pt performs bridging with min assist and pulls up shorts without assist. Pt performed X1 vestibular exercise while sitting up in bed, able to perform 2 x 20 sec at slow rate before needing to rest. Pt transferred supine>sitting with supervision, pt reports minimal dizziness today. BP in sitting- 142/85. Pt performed squat pivot transfer from bed>w/c with mod assist towards the L. Pt propelled w/c from room>gym with supervision and verbal cues for techniques, using B UEs and LEs, pt with moderate increase in work of breathing. Pt's BP checked again- 143/90. Pt transferred to the mat squat pivot with mod assist and manual facilitation for weightshifting. Pt performed sit<>stand with mod assist and RW, able to maintain standing position x 40 sec working on lateral weightshifting, therapist providing manual facilitation for hip extension. BP after standing activity- 151/95. Pt performed sit<>stand with mod assist and RW, worked on pre-gait stepping forward/back with R LE, posterior LOB sitting back on mat. Pt performed sit<>stand with RW and min assist using mirror for visual feedback, improved upright posture, performed stepping in place with R LE working on L LE stance control. Pt performed sit<>stand with RW and min assist, use of mirror for visual feedback and worked on pre-gait stepping forward/back with L LE x 5, verbal cues for foot placement towards front L wheel of walker,  manual facilitation for lateral weightshift over R. BP checked after standing activity- 142/80. Pt transported back to room and left seated with needs in reach, alarm set.   Therapy Documentation Precautions:  Precautions Precautions: Fall Restrictions Weight Bearing Restrictions: No   See Function Navigator for Current Functional Status.   Therapy/Group: Individual Therapy  Netta Corrigan, PT, DPT 08/28/2017, 7:51 AM

## 2017-08-28 NOTE — Patient Care Conference (Signed)
Inpatient RehabilitationTeam Conference and Plan of Care Update Date: 08/28/2017   Time: 10:50 Am    Patient Name: [name]      Medical Record Number: [id number]  Date of Birth: 1935-10-06 Sex: Male         Room/Bed: 4W20C/4W20C-01 Payor Info: Payor: [employer] / Plan: [employer] MEDICARE / Product Type: *No Product type* /    Admitting Diagnosis: Rt CVA  Admit Date/Time:  08/19/2017  2:58 PM Admission Comments: No comment available   Primary Diagnosis:  <principal problem not specified> Principal Problem: <principal problem not specified>  Patient Active Problem List   Diagnosis Date Noted  . Wheezing   . Left foot drop   . Anemia of chronic disease   . Acute blood loss anemia   . Urinary frequency   . 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 Acute brainstem infarction ([place])   . Generalized OA   . Gastritis and gastroduodenitis   . History of GI bleed   . Chronic obstructive pulmonary disease ([place])   . AKI (acute kidney injury) ([place])   . Stage 3 chronic kidney disease ([place])   . Perianal abscess   . CVA (cerebral vascular accident) ([place]) 08/14/2017  . Renal failure (ARF), acute on chronic (HCC) 08/13/2017  . Thoracic aortic aneurysm without rupture ([place]) 12/04/2016  . Essential hypertension 12/04/2016    Expected Discharge Date: Expected Discharge Date: 09/05/17  Team Members Present: Physician leading conference: Dr. Alysia Penna Social Worker Present: Ovidio Kin, LCSW Nurse Present: Other (comment)(Kalya Mabe-LPN) PT Present: Michaelene Song, PT OT Present: Roanna Epley, [place], OT SLP Present: Weston Anna, SLP PPS Coordinator present : Daiva Nakayama, RN, CRRN     Current Status/Progress Goal Weekly Team Focus  Medical   Blood pressure is remaining in the higher range, chronic left foot drop  Maintain blood pressures to avoid orthostasis  Continue rehab program, emphasizing mobility, blood pressure control ongoing    Bowel/Bladder   Continent of bowel and bladder; LBM 08/26/17  Remain continent of bowel and bladder  Assess bowel and bladder function q shift and PRN   Swallow/Nutrition/ Hydration             ADL's   mod A/min A functional tranfsers, min A UB self care, mod A LB self care, low endurance, continued dizziness and orthostatic BP  supervision overall, CGA - shower transfer  ADL retraining, functoinal transfers, standing balance, LUE NMR, activity tolerance   Mobility   min assist transfers, min assist sit<>stand with RW. min assist bed mobility. Unable to assess gait this week secondary to dizziness and low BP  supervision to min assist household gait   activity tolerance, functional transfers, gait, balance, L NMR   Communication             Safety/Cognition/ Behavioral Observations            Pain   No complaints of pain at this time  Remain free of infection and breakdown  Assess pain q shift and PRN and treat as needed   Skin   MASD to groin area treated with barrier cream;, boil/small abcess on buttocks treated with warm compress and Hibiclens  Remain free of breakdown and infection  Assess skin q shift and PRN      *See Care Plan and progress notes for long and short-term goals.     Barriers to Discharge  Current Status/Progress Possible Resolutions Date Resolved   Physician  Medical stability     Starting to progress towards goals  Continue rehab see above      Nursing                  PT  Decreased caregiver support;Medical stability  low BP. Wife unable to provide physical assist.               OT                  SLP                SW                Discharge Planning/Teaching Needs:  Wife is only able to provide supervision level will need to see if can manage min for gait or plan may change for discharge      Team Discussion:  Progressing toward his goals of supervision level. Speech intelligibility better. Awareness better and activity tolerance is  improving. Needs rest breaks due to heart issues. Having a good day today and hopeful this will continue. MD adjusting meds. Wife not able to provide care so will need to be supervision and meet his goals or another option will need to be pursued.  Revisions to Treatment Plan:  DC 8/22    Continued Need for Acute Rehabilitation Level of Care: The patient requires daily medical management by a physician with specialized training in physical medicine and rehabilitation for the following conditions: Daily direction of a multidisciplinary physical rehabilitation program to ensure safe treatment while eliciting the highest outcome that is of practical value to the patient.: Yes Daily medical management of patient stability for increased activity during participation in an intensive rehabilitation regime.: Yes Daily analysis of laboratory values and/or radiology reports with any subsequent need for medication adjustment of medical intervention for : Neurological problems;Blood pressure problems  Brithany Whitworth, [place] Rhyme 08/29/2017, 8:13 AM

## 2017-08-28 NOTE — Progress Notes (Addendum)
Physical Therapy Session Note  Patient Details  Name: [name] MRN: [id number] Date of Birth: [birth year]  Today's Date: 08/28/2017 PT Individual Time: 1413-1440 PT Individual Time Calculation (min): 27 min   Short Term Goals: Week 2:  PT Short Term Goal 1 (Week 2): STG=LTG due to ELOS  Skilled Therapeutic Interventions/Progress Updates:  Pt received in w/c reporting dizziness but thigh high teds & abdominal binder donned & BP sitting = 118/75 mmHg (LUE) HR = 78 bpm - RN made aware & cleared pt for participation if pt agreeable. Pt willing to try standing and tolerated standing ~2 minutes with min assist for balance and BP = 127/74 mmHg, HR = 88 bpm with pt reporting pain above/behind L eye. Transported pt to gym via w/c total assist where pt transferred sit>stand and attempted to ambulate with RW & L hand orthosis with pt able to advance RLE but then losing balance & requiring mod/max assist to safely sit in w/c; pt reported feeling weak. Transitioned to cybex kinetron from w/c level with task focusing on BLE strengthening & transitioning to therapist controlling R pedal to focus on LLE strengthening & NMR. Pt requested to return to bed at end of session and completed stand pivot w/c>bed with min assist, sit>supine without assist. Pt left in bed with alarm set & all needs within reach, family present in room.  Therapy Documentation Precautions:  Precautions Precautions: Fall Restrictions Weight Bearing Restrictions: No   See Function Navigator for Current Functional Status.   Therapy/Group: Individual Therapy  [name] 08/28/2017, 4:31 PM

## 2017-08-28 NOTE — Progress Notes (Signed)
Social Work Patient ID: [name], male   DOB: [birth year], 82 y.o.   MRN: [id number]  Met with pt and left message for wife to discuss team conference goals supervision and target discharge 8/22. Team is aware wife can not provide physical care so will need to get him to a supervision level prior to going home if this is feasible. Pt feels better today and hopes this will get better each day. Will work toward discharge date.

## 2017-08-28 NOTE — Plan of Care (Signed)
  Problem: RH BOWEL ELIMINATION Goal: RH STG MANAGE BOWEL WITH ASSISTANCE Description STG Manage Bowel with Assistance. Min  Outcome: Progressing   Problem: RH BLADDER ELIMINATION Goal: RH STG MANAGE BLADDER WITH ASSISTANCE Description STG Manage Bladder With Assistance. Min  Outcome: Progressing   Problem: RH SKIN INTEGRITY Goal: RH STG MAINTAIN SKIN INTEGRITY WITH ASSISTANCE Description STG Maintain Skin Integrity With Assistance. No breakdown or infection while on rehab with min assist  Outcome: Progressing   Problem: RH SAFETY Goal: RH STG ADHERE TO SAFETY PRECAUTIONS W/ASSISTANCE/DEVICE Description STG Adhere to Safety Precautions With Assistance/Device. Min  Outcome: Progressing   Problem: RH KNOWLEDGE DEFICIT Goal: RH STG INCREASE KNOWLEDGE OF HYPERTENSION Description Patient and family will be able to describe medications to control HTN and describe target blood pressure parameters with min cues.   Outcome: Progressing

## 2017-08-28 NOTE — Progress Notes (Addendum)
Physical Therapy Session Note  Patient Details  Name: [name] MRN: [id number] Date of Birth: 08-14-1935  Today's Date: 08/28/2017 PT Individual Time: 1300-1345(make up time ) PT Individual Time Calculation (min): 45 min   Short Term Goals: Week 2:  PT Short Term Goal 1 (Week 2): STG=LTG due to ELOS  Skilled Therapeutic Interventions/Progress Updates:  Pt received supine in bed and agreeable to PT. Supine>sit transfer with supervsion assist and min cues for safety awareness and attention to the LUE. Sitting balacne EOB x 2 min for PT to don abdominal binder.    Stand pivot transfers with mod assist and RW to and from various surfaces inclduing WC, mat table, and bed. Moderate cues for AD management, gait pattern and improved eccentric control to sitting surface.    Blocked practice sit<>stand with min-mod assist from PT with 10 sec hold in standing with BUE support on RW. Pr noted to have improved postural control with increased repetitions.   Gait training with RW x 69f with moderate assist from PT and max cues for AD management, and gait pattern to improve weight shifting and step length Bil. BP taken following gait training, 125/76. Asymptomatic for orthostasis.   WC mobility x 779fwith supervision assist overall from PT with min-moderate cues for improved use and LUE and attention to the L to prevent hitting wall.   [facility] returned to room and [facility] in reach and all needs met.           Therapy Documentation Precautions:  Precautions Precautions: Fall Restrictions Weight Bearing Restrictions: No Vital Signs: Therapy Vitals Temp: 98.3 F (36.8 C) Temp Source: Oral Pulse Rate: 79 BP: 100/78 Patient Position (if appropriate): Sitting Oxygen Therapy SpO2: 99 % O2 Device: Room Air Pain: Pain Assessment Pain Scale: 0-10 Pain Score: 0-No pain   See Function Navigator for Current Functional Status.   Therapy/Group: Individual  Therapy  AuLorie Phenix/14/2019, 1:49 PM

## 2017-08-28 NOTE — Progress Notes (Signed)
Subjective/Complaints: participated very well per PT this am, BP running higher  ROS:  Denies CP SOB N/V/D  Objective: Vital Signs: Blood pressure 139/85, pulse 81, temperature 98 F (36.7 C), temperature source Oral, resp. rate 20, height _0  (1.753 m), weight 93.2 kg, SpO2 95 %. Dg Chest Port 1 View  Result Date: 08/26/2017 CLINICAL DATA:  Fever EXAM: PORTABLE CHEST 1 VIEW COMPARISON:  08/19/2017, 08/13/2017, CT chest 11/08/2016 FINDINGS: No acute opacity or pleural effusion. Normal cardiomediastinal silhouette. Minimal scarring or atelectasis at the left base. No pneumothorax. IMPRESSION: No active disease. Electronically Signed   By: Donavan Foil M.D.   On: 08/26/2017 16:44   Results for orders placed or performed during the hospital encounter of 08/19/17 (from the past 72 hour(s))  Glucose, capillary     Status: Abnormal   Collection Time: 08/25/17  4:24 PM  Result Value Ref Range   Glucose-Capillary 117 (H) 70 - 99 mg/dL  Basic metabolic panel     Status: Abnormal   Collection Time: 08/26/17 12:30 PM  Result Value Ref Range   Sodium 137 135 - 145 mmol/L   Potassium 4.3 3.5 - 5.1 mmol/L   Chloride 110 98 - 111 mmol/L   CO2 18 (L) 22 - 32 mmol/L   Glucose, Bld 127 (H) 70 - 99 mg/dL   BUN 53 (H) 8 - 23 mg/dL   Creatinine, Ser 3.31 (H) 0.61 - 1.24 mg/dL   Calcium 8.5 (L) 8.9 - 10.3 mg/dL   GFR calc non Af Amer 16 (L) >60 mL/min   GFR calc Af Amer 19 (L) >60 mL/min    Comment: (NOTE) The eGFR has been calculated using the CKD EPI equation. This calculation has not been validated in all clinical situations. eGFR's persistently <60 mL/min signify possible Chronic Kidney Disease.    Anion gap 9 5 - 15    Comment: Performed at [facility] [address], [place], [place] [address]  Urinalysis, Routine w reflex microscopic     Status: Abnormal   Collection Time: 08/26/17  6:51 PM  Result Value Ref Range   Color, Urine YELLOW YELLOW   APPearance CLEAR CLEAR   Specific Gravity, Urine 1.015 1.005 - 1.030   pH 5.0 5.0 - 8.0   Glucose, UA NEGATIVE NEGATIVE mg/dL   Hgb urine dipstick SMALL (A) NEGATIVE   Bilirubin Urine NEGATIVE NEGATIVE   Ketones, ur NEGATIVE NEGATIVE mg/dL   Protein, ur 100 (A) NEGATIVE mg/dL   Nitrite NEGATIVE NEGATIVE   Leukocytes, UA NEGATIVE NEGATIVE   RBC / HPF 0-5 0 - 5 RBC/hpf   WBC, UA 0-5 0 - 5 WBC/hpf   Bacteria, UA RARE (A) NONE SEEN   Squamous Epithelial / LPF 0-5 0 - 5    Comment: Performed at [facility], [address] [place], [place] [address]     HENT: Normocephalic, atraumatic Eyes: EOMI. No discharge. Cardio: RRR. No JVD. Resp: CTA B/L and unlabored GI: BS and ND Skin: Warm and dry. Intact. Neuro: Alert/Oriented Motor: Motor: Left delt 3-/5, bi, tri 2+/5, grip 3+/5  LLE: 4+/5 proximal 2/5 distally Musc/Skel:  No edema or tenderness in extremities Gen NAD. Vital signs reviewed   Assessment/Plan: 1. 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 Physiatrist is providing close team supervision and 24 hour management of active medical problems listed below. Physiatrist and rehab team continue to assess barriers  to discharge/monitor patient progress toward functional and medical goals. FIM: Function - Bathing Bathing activity did not occur: Refused Position: Sitting EOB Body parts bathed by patient: Left arm, Chest, Abdomen, Right arm(pt reported LB had been done by nursing) Body parts bathed by helper: Right arm, Buttocks, Back Assist Level: Supervision or verbal cues  Function- Upper Body Dressing/Undressing What is the patient wearing?: Pull over shirt/dress Pull over shirt/dress - Perfomed by patient: Put head through opening, Pull shirt over trunk, Thread/unthread right sleeve, Thread/unthread left sleeve Pull over shirt/dress - Perfomed by helper: Thread/unthread right  sleeve, Thread/unthread left sleeve Button up shirt - Perfomed by patient: Thread/unthread right sleeve, Thread/unthread left sleeve, Pull shirt around back Button up shirt - Perfomed by helper: Button/unbutton shirt Assist Level: Supervision or verbal cues Function - Lower Body Dressing/Undressing What is the patient wearing?: Pants, [employer], Socks Position: Sitting EOB Pants- Performed by patient: Thread/unthread right pants leg, Thread/unthread left pants leg Pants- Performed by helper: Pull pants up/down Non-skid slipper socks- Performed by helper: Don/doff right sock, Don/doff left sock Socks - Performed by patient: Don/doff right sock Socks - Performed by helper: Don/doff right sock, Don/doff left sock Shoes - Performed by patient: Don/doff right shoe, Don/doff left shoe Shoes - Performed by helper: Fasten right, Fasten left TED Hose - Performed by helper: Don/doff right TED hose, Don/doff left TED hose Assist for footwear: Partial/moderate assist Assist for lower body dressing: Touching or steadying assistance (Pt > 75%)  Function - Toileting Toileting activity did not occur: No continent bowel/bladder event Toileting steps completed by patient: Adjust clothing prior to toileting, Adjust clothing after toileting Toileting steps completed by helper: Adjust clothing prior to toileting, Performs perineal hygiene, Adjust clothing after toileting Toileting Assistive Devices: Grab bar or rail, Toilet aid(Urinal) Assist level: Set up/obtain supplies, More than reasonable time, Two helpers  Function - Air cabin crew transfer assistive device: Elevated toilet seat/BSC over toilet Assist level to toilet: Moderate assist (Pt 50 - 74%/lift or lower) Assist level from toilet: Dependent (Pt equals 0%)(obtained stedy for assist from toilet > shower)  Function - Chair/bed transfer Chair/bed transfer method: Stand pivot Chair/bed transfer assist level: Touching or steadying assistance  (Pt > 75%) Chair/bed transfer assistive device: Armrests, Bedrails, Walker Chair/bed transfer details: Verbal cues for technique, Verbal cues for precautions/safety, Manual facilitation for placement, Verbal cues for safe use of DME/AE  Function - Locomotion: Wheelchair Will patient use wheelchair at discharge?: (TBD) Type: Manual Max wheelchair distance: 150' Assist Level: Supervision or verbal cues Assist Level: Supervision or verbal cues Wheel 150 feet activity did not occur: Safety/medical concerns Assist Level: Supervision or verbal cues Turns around,maneuvers to table,bed, and toilet,negotiates 3% grade,maneuvers on rugs and over doorsills: No Function - Locomotion: Ambulation Assistive device: Rail in hallway Max distance: 10' Assist level: Maximal assist (Pt 25 - 49%) Assist level: Maximal assist (Pt 25 - 49%) Walk 50 feet with 2 turns activity did not occur: Safety/medical concerns Walk 150 feet activity did not occur: Safety/medical concerns Walk 10 feet on uneven surfaces activity did not occur: Safety/medical concerns  Function - Comprehension Comprehension: Auditory Comprehension assist level: Follows complex conversation/direction with extra time/assistive device  Function - Expression Expression: Verbal Expression assist level: Expresses complex ideas: With extra time/assistive device  Function - Social Interaction Social Interaction assist level: Interacts appropriately 90% of the time - Needs monitoring or encouragement for participation or interaction.  Function - Problem Solving Problem solving assist level: Solves basic problems with no assist  Function - Memory Memory assist level: Recognizes or recalls 90% of the time/requires cueing < 10% of the time Patient normally able to recall (first 3 days only): Current season, Location of own room, Staff names and faces, That he or she is in a hospital  Medical Problem List and Plan: 1.  Left hemiparesis,  limitations with self-care, balance deficits secondary to right medullary infarct with buttocks cyst  RIght meduallary infarct in setting of partially thrombosed BA aneurysm.  Similar symptoms as last weak when repeat CT and MRI demonstarted no new stroke, neuro felt it may be TIA vs  Hypoperfusion, BP normal but running lower that over the last couple days, will reduce  Lopressor to 12.58m BID, may do therapy.  Pt back to baseline but if this recurs consider heparin then warfarin- d/w pt he is aware and would like to avoid warfarin if possible  Believe hypoperfusion has been the main limitation rather than TIA given positive response to more permissive HTN    2.  DVT Prophylaxis/Anticoagulation: Pharmaceutical: Lovenox 3. Idiopathic neuropathy/Pain Management: Gabapentin was added for LUE dysesthesias. Continue ultram prn as at home.  4. Mood: LCSW to follow for evaluation and support.  5. Neuropsych: This patient is capable of making decisions on his own behalf. 6. Skin/Wound Care: routine pressure relief measures.  7. Fluids/Electrolytes/Nutrition: Monitor I/O.  8. COPD: Increased albuterol to every 4 hours prn. Continue Onoro. Encourage IS to help with atelectasis 9.  Acute on chronic renal failure--stage IV:  Baseline SCr-3.65. On calcitriol for secondary hyperparathyroidism. Continue to monitor with serial checks. Avoid ARB/ No ACE due to allergy.   Cr. 3.53 on 8/8  Encourage fluid intake           IVF daily at bedtime started on 8/8  10. H/o gastritis due to NSAIDs/ GERD: Continue PPI.  11. Dyslipidemia: on Lipitor.  12. CAD/AS: Treated medically with metoprolol, ASA and lipitor.  13. Peri anal cyst:  With acute on chronic inflammation. No prior treatment per reports. Started on Augmentin on 8/3 for  7-10 days treatment. Continue moist warm compresses to help drain.    14. ABLA on Anemia of chronic disease: Hgb- 11.2-12.5 range. Continue to monitor.   Hemoglobin 10.4 on 8/8 monitor weekly  while hospitalized  Stool OB pos,is on Lovenox may need OP f/u with GI, no emergent issues 15. Constipation: Scheduled Senna S 2 at bedtime. 16.  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 Multifocal neuropathy, hx of sciatica, may benefit from outpt EMG to further evaluate 17. Blood pressure- will have goal of 130-140s- enc fluid hold lopressor for Sys BP <130  Orthostatics negative on 8/14, lying to sitting to standing 18. Urinary frequency  At night, likely related to IVF  PVRs ordered  Condom cath qhs until PVRs/IVF  LOS (Days) 9 A FACE TO FACE EVALUATION WAS PERFORMED  ACharlett Blake8/14/2019, 9:01 AM

## 2017-08-29 ENCOUNTER — Inpatient Hospital Stay (HOSPITAL_COMMUNITY): Payer: Medicare Other | Admitting: Physical Therapy

## 2017-08-29 ENCOUNTER — Inpatient Hospital Stay (HOSPITAL_COMMUNITY): Payer: Medicare Other

## 2017-08-29 LAB — CBC
HCT: 32.9 % — ABNORMAL LOW (ref 39.0–52.0)
HEMOGLOBIN: 10.1 g/dL — AB (ref 13.0–17.0)
MCH: 27.4 pg (ref 26.0–34.0)
MCHC: 30.7 g/dL (ref 30.0–36.0)
MCV: 89.2 fL (ref 78.0–100.0)
Platelets: 202 10*3/uL (ref 150–400)
RBC: 3.69 MIL/uL — ABNORMAL LOW (ref 4.22–5.81)
RDW: 15.8 % — ABNORMAL HIGH (ref 11.5–15.5)
WBC: 7.9 10*3/uL (ref 4.0–10.5)

## 2017-08-29 LAB — BASIC METABOLIC PANEL
ANION GAP: 7 (ref 5–15)
BUN: 64 mg/dL — ABNORMAL HIGH (ref 8–23)
CO2: 21 mmol/L — ABNORMAL LOW (ref 22–32)
Calcium: 8.5 mg/dL — ABNORMAL LOW (ref 8.9–10.3)
Chloride: 114 mmol/L — ABNORMAL HIGH (ref 98–111)
Creatinine, Ser: 3.53 mg/dL — ABNORMAL HIGH (ref 0.61–1.24)
GFR calc Af Amer: 17 mL/min — ABNORMAL LOW (ref >60)
GFR, EST NON AFRICAN AMERICAN: 15 mL/min — AB (ref >60)
GLUCOSE: 109 mg/dL — AB (ref 70–99)
POTASSIUM: 4.3 mmol/L (ref 3.5–5.1)
Sodium: 142 mmol/L (ref 135–145)

## 2017-08-29 MED ORDER — POLYVINYL ALCOHOL 1.4 % OP SOLN
1.0000 [drp] | OPHTHALMIC | Status: DC | PRN
  Filled 2017-08-29: qty 15

## 2017-08-29 NOTE — Progress Notes (Signed)
Occupational Therapy Session Note  Patient Details  Name: [name] MRN: [id number] Date of Birth: 16-Dec-1935  Today's Date: 08/29/2017 OT Individual Time: 0700-0800 OT Individual Time Calculation (min): 60 min    Short Term Goals: Week 2:  OT Short Term Goal 1 (Week 2): STG=LTG secondary to ELOS  Skilled Therapeutic Interventions/Progress Updates:    Pt asleep upon arrival but easily aroused.  Pt agreeable to therapy.  Pt sat EOB to eat breakfast with setup assist to open containers.  BP sitting EOB without Ted hose or binder-127/77 with no s/s. Pt performed stand pivot transfer with min A to w/c and completed bathing dressing tasks with sit<>stand from w/c at sink.  No reports of dizziness.  Pt required assistance with pulling up pants and washing buttocks.  Pt continues to initiate use of LUE in all functional tasks.  LUE as gross assist when reaching across midline. Pt fatigues easily and requires multiple rest breaks during session.  Ted hose donned and pt remained in w/c with belt alarm activated and all needs within reach.   Therapy Documentation Precautions:  Precautions Precautions: Fall Restrictions Weight Bearing Restrictions: No Pain:  Pt denies pain  See Function Navigator for Current Functional Status.   Therapy/Group: Individual Therapy  Leroy Libman 08/29/2017, 9:14 AM

## 2017-08-29 NOTE — Progress Notes (Signed)
Physical Therapy Session Note  Patient Details  Name: [name] MRN: [id number] Date of Birth: [birth year]  Today's Date: 08/29/2017 PT Individual Time: 0900-0955 PT Individual Time Calculation (min): 55 min   Short Term Goals: Week 2:  PT Short Term Goal 1 (Week 2): STG=LTG due to ELOS  Skilled Therapeutic Interventions/Progress Updates:   Pt in w/c and agreeable to therapy, denies pain and reports feeling better today. Total assist w/c transport to/from gym for time management, BP 120/72 prior to standing activity. Donned abdominal binder. Worked on sit<>stands to [name], pregait tasks at [name], and gait. Mirror visual feedback for posture and midline orientation, min tactile cues to correct at first, fading to supervision. Pregait tasks included step forward and step back alternating w/ BLEs, mod assist for upright balance during tasks. No manual assist needed for L foot placement or L quad activation during single leg stance. BP systolic > 102 throughout session w/ TED hose and abdominal binder donned, denied any c/o dizziness. Ambulated 10' and 5' w/ mod assist for upright balance and L foot placement. C/o L calf pain and weakness, prompting immediate return to sitting in w/c, unable to support himself further. L calf pain resolves in sitting and calf is warm to touch at varicose vein site on calf. RN and PA-C made aware. Returned to room and ended session in supine, call bell within reach and all needs met.   Therapy Documentation Precautions:  Precautions Precautions: Fall Restrictions Weight Bearing Restrictions: No Vital Signs: Therapy Vitals Pulse Rate: 90 Resp: 18 Patient Position (if appropriate): Sitting Oxygen Therapy SpO2: 98 % O2 Device: Room Air  See Function Navigator for Current Functional Status.   Therapy/Group: Individual Therapy  [name] 08/29/2017, 9:56 AM

## 2017-08-29 NOTE — Progress Notes (Signed)
Occupational Therapy Session Note  Patient Details  Name: [name] MRN: [id number] Date of Birth: 1935-01-28  Today's Date: 08/29/2017 OT Individual Time: 1300-1345 OT Individual Time Calculation (min): 45 min    Short Term Goals: Week 2:  OT Short Term Goal 1 (Week 2): STG=LTG secondary to ELOS  Skilled Therapeutic Interventions/Progress Updates:    Pt asleep in bed upon arrival but easily aroused.  Pt requested to use toilet after sitting EOB (supervision). Pt required min A for stand pivot transfer bed<>w/c and w/c<>toilet.  Pt required max A for clothing management. Pt engaged in table activities with theraputty and small foam cubes with focus on LUE/hand strengthening and [facility] tasks.  Pt's LUE fatigues quickly and requires multiple rest breaks during activities.  Pt remained in w/c with belt alarm activated and all needs within reach.   Therapy Documentation Precautions:  Precautions Precautions: Fall Restrictions Weight Bearing Restrictions: No   Pain: Pain Assessment Pain Score: Asleep See Function Navigator for Current Functional Status.   Therapy/Group: Individual Therapy  Leroy Libman 08/29/2017, 2:08 PM

## 2017-08-29 NOTE — Progress Notes (Signed)
Subjective/Complaints: No issues feeling stronger in the left arm today  ROS:  Denies CP SOB N/V/D  Objective: Vital Signs: Blood pressure 105/72, pulse 82, temperature 98.3 F (36.8 C), temperature source Oral, resp. rate 20, height 5' 9" (1.753 m), weight 93.2 kg, SpO2 97 %. No results found. Results for orders placed or performed during the hospital encounter of 08/19/17 (from the past 72 hour(s))  Urinalysis, Routine w reflex microscopic     Status: Abnormal   Collection Time: 08/26/17  6:51 PM  Result Value Ref Range   Color, Urine YELLOW YELLOW   APPearance CLEAR CLEAR   Specific Gravity, Urine 1.015 1.005 - 1.030   pH 5.0 5.0 - 8.0   Glucose, UA NEGATIVE NEGATIVE mg/dL   Hgb urine dipstick SMALL (A) NEGATIVE   Bilirubin Urine NEGATIVE NEGATIVE   Ketones, ur NEGATIVE NEGATIVE mg/dL   Protein, ur 100 (A) NEGATIVE mg/dL   Nitrite NEGATIVE NEGATIVE   Leukocytes, UA NEGATIVE NEGATIVE   RBC / HPF 0-5 0 - 5 RBC/hpf   WBC, UA 0-5 0 - 5 WBC/hpf   Bacteria, UA RARE (A) NONE SEEN   Squamous Epithelial / LPF 0-5 0 - 5    Comment: Performed at [facility], [address] [place], [place] [address]  Basic metabolic panel     Status: Abnormal   Collection Time: 08/29/17  5:11 AM  Result Value Ref Range   Sodium 142 135 - 145 mmol/L   Potassium 4.3 3.5 - 5.1 mmol/L   Chloride 114 (H) 98 - 111 mmol/L   CO2 21 (L) 22 - 32 mmol/L   Glucose, Bld 109 (H) 70 - 99 mg/dL   BUN 64 (H) 8 - 23 mg/dL   Creatinine, Ser 3.53 (H) 0.61 - 1.24 mg/dL   Calcium 8.5 (L) 8.9 - 10.3 mg/dL   GFR calc non Af Amer 15 (L) >60 mL/min   GFR calc Af Amer 17 (L) >60 mL/min    Comment: (NOTE) The eGFR has been calculated using the CKD EPI equation. This calculation has not been validated in all clinical situations. eGFR's persistently <60 mL/min signify possible Chronic Kidney Disease.    Anion gap 7 5 - 15    Comment: Performed at [facility] [address] [place], [place] [address]   CBC     Status: Abnormal   Collection Time: 08/29/17  5:11 AM  Result Value Ref Range   WBC 7.9 4.0 - 10.5 K/uL   RBC 3.69 (L) 4.22 - 5.81 MIL/uL   Hemoglobin 10.1 (L) 13.0 - 17.0 g/dL   HCT 32.9 (L) 39.0 - 52.0 %   MCV 89.2 78.0 - 100.0 fL   MCH 27.4 26.0 - 34.0 pg   MCHC 30.7 30.0 - 36.0 g/dL   RDW 15.8 (H) 11.5 - 15.5 %   Platelets 202 150 - 400 K/uL    Comment: Performed at [facility] [address] [place],  [address]     HENT: Normocephalic, atraumatic Eyes: EOMI. No discharge. Cardio: RRR. No JVD. Resp: CTA B/L and unlabored GI: BS and ND Skin: Warm and dry. Intact. Neuro: Alert/Oriented Motor: Motor: Left delt 3-/5, bi, tri 2+/5, grip 3+/5  LLE: 4+/5 proximal 2/5 distally Musc/Skel:  No edema or tenderness in extremities Gen NAD. Vital signs reviewed   Assessment/Plan: 1. 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 Physiatrist  is providing close team supervision and 24 hour management of active medical problems listed below. Physiatrist and rehab team continue to assess barriers to discharge/monitor patient progress toward functional and medical goals. FIM: Function - Bathing Bathing activity did not occur: Refused Position: Sitting EOB Body parts bathed by patient: Left arm, Chest, Abdomen, Right arm(pt reported LB had been done by nursing) Body parts bathed by helper: Right arm, Buttocks, Back Assist Level: Supervision or verbal cues  Function- Upper Body Dressing/Undressing What is the patient wearing?: Pull over shirt/dress Pull over shirt/dress - Perfomed by patient: Put head through opening, Pull shirt over trunk, Thread/unthread right sleeve, Thread/unthread left sleeve Pull over shirt/dress - Perfomed by helper: Thread/unthread right sleeve, Thread/unthread left sleeve Button up shirt - Perfomed by patient:  Thread/unthread right sleeve, Thread/unthread left sleeve, Pull shirt around back Button up shirt - Perfomed by helper: Button/unbutton shirt Assist Level: Supervision or verbal cues Function - Lower Body Dressing/Undressing What is the patient wearing?: Pants, [employer], Socks Position: Sitting EOB Pants- Performed by patient: Thread/unthread right pants leg, Thread/unthread left pants leg Pants- Performed by helper: Pull pants up/down Non-skid slipper socks- Performed by helper: Don/doff right sock, Don/doff left sock Socks - Performed by patient: Don/doff right sock Socks - Performed by helper: Don/doff right sock, Don/doff left sock Shoes - Performed by patient: Don/doff right shoe, Don/doff left shoe Shoes - Performed by helper: Fasten right, Fasten left TED Hose - Performed by helper: Don/doff right TED hose, Don/doff left TED hose Assist for footwear: Partial/moderate assist Assist for lower body dressing: Touching or steadying assistance (Pt > 75%)  Function - Toileting Toileting activity did not occur: No continent bowel/bladder event Toileting steps completed by patient: Adjust clothing prior to toileting, Adjust clothing after toileting Toileting steps completed by helper: Adjust clothing prior to toileting, Performs perineal hygiene, Adjust clothing after toileting Toileting Assistive Devices: Grab bar or rail, Toilet aid Assist level: More than reasonable time, Set up/obtain supplies  Function - Air cabin crew transfer assistive device: Elevated toilet seat/BSC over toilet Assist level to toilet: Moderate assist (Pt 50 - 74%/lift or lower) Assist level from toilet: Dependent (Pt equals 0%)(obtained stedy for assist from toilet > shower)  Function - Chair/bed transfer Chair/bed transfer method: Stand pivot Chair/bed transfer assist level: Touching or steadying assistance (Pt > 75%) Chair/bed transfer assistive device: Armrests, Bedrails Chair/bed transfer details:  Verbal cues for technique, Verbal cues for precautions/safety, Manual facilitation for placement, Verbal cues for safe use of DME/AE  Function - Locomotion: Wheelchair Will patient use wheelchair at discharge?: (TBD) Type: Manual Max wheelchair distance: 75 Assist Level: Supervision or verbal cues Assist Level: Supervision or verbal cues Wheel 150 feet activity did not occur: Safety/medical concerns Assist Level: Supervision or verbal cues Turns around,maneuvers to table,bed, and toilet,negotiates 3% grade,maneuvers on rugs and over doorsills: No Function - Locomotion: Ambulation Assistive device: Walker-rolling Max distance: 10' Assist level: Moderate assist (Pt 50 - 74%) Assist level: Moderate assist (Pt 50 - 74%) Walk 50 feet with 2 turns activity did not occur: Safety/medical concerns Walk 150 feet activity did not occur: Safety/medical concerns Walk 10 feet on uneven surfaces activity did not occur: Safety/medical concerns  Function - Comprehension Comprehension: Auditory Comprehension assist level: Follows complex conversation/direction with extra time/assistive device  Function - Expression Expression: Verbal Expression assist level: Expresses complex ideas: With extra time/assistive device  Function - Social Interaction Social Interaction assist level: Interacts appropriately 90% of the time - Needs monitoring or encouragement for  participation or interaction.  Function - Problem Solving Problem solving assist level: Solves basic problems with no assist  Function - Memory Memory assist level: Recognizes or recalls 90% of the time/requires cueing < 10% of the time Patient normally able to recall (first 3 days only): Current season, Location of own room, Staff names and faces, That he or she is in a hospital  Medical Problem List and Plan: 1.  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  Similar symptoms as last weak when repeat CT and MRI demonstarted no new stroke, neuro felt it may be TIA vs  Hypoperfusion, BP normal but running lower that over the last couple days, will reduce  Lopressor to 12.70m BID, may do therapy.  Pt back to baseline but if this recurs consider heparin then warfarin- d/w pt he is aware and would like to avoid warfarin if possible  Believe hypoperfusion has been the main limitation rather than TIA given positive response to more permissive HTN    2.  DVT Prophylaxis/Anticoagulation: Pharmaceutical: Lovenox 3. Idiopathic neuropathy/Pain Management: Gabapentin was added for LUE dysesthesias. Continue ultram prn as at home.  4. Mood: LCSW to follow for evaluation and support.  5. Neuropsych: This patient is capable of making decisions on his own behalf. 6. Skin/Wound Care: routine pressure relief measures.  7. Fluids/Electrolytes/Nutrition: Monitor I/O.  8. COPD: Increased albuterol to every 4 hours prn. Continue Onoro. Encourage IS to help with atelectasis 9.  Acute on chronic renal failure--stage IV:  Baseline SCr-3.65. On calcitriol for secondary hyperparathyroidism. Continue to monitor with serial checks. Avoid ARB/ No ACE due to allergy.   Currently at baseline  Encourage fluid intake           IVF daily at bedtime started on 8/8  10. H/o gastritis due to NSAIDs/ GERD: Continue PPI.  11. Dyslipidemia: on Lipitor.  12. CAD/AS: Treated medically with metoprolol, ASA and lipitor.  13. Peri anal cyst:  With acute on chronic inflammation. No prior treatment per reports. Started on Augmentin on 8/3 for  7-10 days treatment. Continue moist warm compresses to help drain.    14. ABLA on Anemia of chronic disease: Hgb- 11.2-12.5 range. Continue to monitor.   Hemoglobin 10.1 on 8/15 monitor weekly while hospitalized  Stool OB pos,is on Lovenox may need OP f/u with GI, no emergent  issues 15. Constipation: Scheduled Senna S 2 at bedtime. 16.  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 Multifocal neuropathy, hx of sciatica, may benefit from outpt EMG to further evaluate 17. Blood pressure- will have goal of 130-140s- enc fluid hold lopressor for Sys BP <130  Orthostatics negative on 8/14, lying to sitting to standing 18. Urinary frequency  At night, likely related to IVF  PVRs ordered  Condom cath qhs until PVRs/IVF  LOS (Days) 10 A FACE TO FACE EVALUATION WAS PERFORMED  ACharlett Blake8/15/2019, 5:51 PM

## 2017-08-30 ENCOUNTER — Inpatient Hospital Stay (HOSPITAL_COMMUNITY): Payer: Medicare Other | Admitting: Physical Therapy

## 2017-08-30 ENCOUNTER — Inpatient Hospital Stay (HOSPITAL_COMMUNITY): Payer: Medicare Other

## 2017-08-30 NOTE — Progress Notes (Signed)
Physical Therapy Session Note  Patient Details  Name: [name] MRN: [id number] Date of Birth: 08/10/1935  Today's Date: 08/30/2017 PT Individual Time: 0900-0952 PT Individual Time Calculation (min): 52 min   Short Term Goals: Week 2:  PT Short Term Goal 1 (Week 2): STG=LTG due to ELOS  Skilled Therapeutic Interventions/Progress Updates:   Pt in w/c and agreeable to therapy, denies pain. Session focused on gait and OOB tolerance. Educated pt on benefits of implementing OOB schedule in anticipation of d/c home in less than 1 week. Discussed positional BP management and making daily goals. Pt verbalized understanding and in agreement, states he does not want to be in bed all day when he gets home. Pt self-propelled w/c to/from therapy gym to work on independence w/ locomotion, supervision using R hemi technique. Ambulated 20' x2 w/ RW, mod assist needed to facilitate lateral weight shifting and for L step placement. Pt unable to maintain L knee extension w/ fatigue, prompting seated rest break (w/c followed close behind for safety). BP remained >117 systolic throughout session until drop to 106 after 2nd bout of gait, no c/o dizziness however. Returned to room and transferred to EOB and to supine w/ min assist. BP back up to 123/86 in supine. Ended session in supine, call bell within reach and all needs met.   Therapy Documentation Precautions:  Precautions Precautions: Fall Restrictions Weight Bearing Restrictions: No Vital Signs: Therapy Vitals Pulse Rate: 82 Resp: 20 BP: 123/86 Patient Position (if appropriate): Lying Oxygen Therapy SpO2: 96 % O2 Device: Room Air  See Function Navigator for Current Functional Status.   Therapy/Group: Individual Therapy  Amy K Arnette 08/30/2017, 9:54 AM  

## 2017-08-30 NOTE — Progress Notes (Signed)
Occupational Therapy Session Note  Patient Details  Name: PRESTON GARABEDIAN MRN: [id number] Date of Birth: April 28, 1935  Today's Date: 08/30/2017 OT Individual Time: 9528-4132 OT Individual Time Calculation (min): 54 min    Short Term Goals: Week 2:  OT Short Term Goal 1 (Week 2): STG=LTG secondary to ELOS  Skilled Therapeutic Interventions/Progress Updates:    Pt resting in bed upon arrival and agreeable to therapy.  Pt stated he "felt tired" this afternoon.  Pt sat EOB with supervision. BO with Teds but no binder-109/73; with Teds and binder-107/67 and 105/69. Pt engaged in seated activities on bedside table and reported some dizziness. Pt returned to bed and remained supine but continued to c/o generalized weakness. Pt engaged in BUE therex with/without 2# weight bar.  Pt instructed in BUE therex without weights to complete when resting in bed.  Pt remained in bed with bed alarm activated.   Therapy Documentation Precautions:  Precautions Precautions: Fall Restrictions Weight Bearing Restrictions: No   Pain:    See Function Navigator for Current Functional Status.   Therapy/Group: Individual Therapy  Leroy Libman 08/30/2017, 1:55 PM

## 2017-08-30 NOTE — Progress Notes (Signed)
Subjective/Complaints:  No issues overnite, did well with PT this am, no dizziness or weakness, BPs generally in 120s ROS:  Denies CP SOB N/V/D  Objective: Vital Signs: Blood pressure 114/76, pulse 82, temperature (!) 97.4 F (36.3 C), temperature source Oral, resp. rate 20, height '5\' 9"'$  (1.753 m), weight 93.2 kg, SpO2 96 %. No results found. Results for orders placed or performed during the hospital encounter of 08/19/17 (from the past 72 hour(s))  Basic metabolic panel     Status: Abnormal   Collection Time: 08/29/17  5:11 AM  Result Value Ref Range   Sodium 142 135 - 145 mmol/L   Potassium 4.3 3.5 - 5.1 mmol/L   Chloride 114 (H) 98 - 111 mmol/L   CO2 21 (L) 22 - 32 mmol/L   Glucose, Bld 109 (H) 70 - 99 mg/dL   BUN 64 (H) 8 - 23 mg/dL   Creatinine, Ser 3.53 (H) 0.61 - 1.24 mg/dL   Calcium 8.5 (L) 8.9 - 10.3 mg/dL   GFR calc non Af Amer 15 (L) >60 mL/min   GFR calc Af Amer 17 (L) >60 mL/min    Comment: (NOTE) The eGFR has been calculated using the CKD EPI equation. This calculation has not been validated in all clinical situations. eGFR's persistently <60 mL/min signify possible Chronic Kidney Disease.    Anion gap 7 5 - 15    Comment: Performed at [facility] [address], [place], [place] [address]  CBC     Status: Abnormal   Collection Time: 08/29/17  5:11 AM  Result Value Ref Range   WBC 7.9 4.0 - 10.5 K/uL   RBC 3.69 (L) 4.22 - 5.81 MIL/uL   Hemoglobin 10.1 (L) 13.0 - 17.0 g/dL   HCT 32.9 (L) 39.0 - 52.0 %   MCV 89.2 78.0 - 100.0 fL   MCH 27.4 26.0 - 34.0 pg   MCHC 30.7 30.0 - 36.0 g/dL   RDW 15.8 (H) 11.5 - 15.5 %   Platelets 202 150 - 400 K/uL    Comment: Performed at [facility] [address], [place], [place] [address]     HENT: Normocephalic, atraumatic Eyes: EOMI. No discharge. Cardio: RRR. No JVD. Resp: CTA B/L and unlabored GI: BS and ND Skin: Warm and dry. Intact. Neuro: Alert/Oriented Motor: Motor: Left delt 3-/5, bi, tri  2+/5, grip 3+/5  LLE: 4+/5 proximal 2/5 distally Musc/Skel:  No edema or tenderness in extremities Gen NAD. Vital signs reviewed   Assessment/Plan: 1. 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 Physiatrist is providing close team supervision and 24 hour management of active medical problems listed below. Physiatrist and rehab team continue to assess barriers to discharge/monitor patient progress toward functional and medical goals. FIM: Function - Bathing Bathing activity did not occur: Refused Position: Sitting EOB Body parts bathed by patient: Left arm, Chest, Abdomen, Right arm(pt reported LB had been done by nursing) Body parts bathed by helper: Right arm, Buttocks, Back Assist Level: Supervision or verbal cues  Function- Upper Body Dressing/Undressing What is the patient wearing?: Pull over shirt/dress Pull over shirt/dress - Perfomed by patient: Put head through opening, Pull shirt over trunk, Thread/unthread right sleeve, Thread/unthread left sleeve Pull over shirt/dress - Perfomed by helper: Thread/unthread right sleeve, Thread/unthread left sleeve Button up shirt - Perfomed by patient: Thread/unthread right sleeve, Thread/unthread left sleeve, Pull shirt around back Button up  shirt - Perfomed by helper: Button/unbutton shirt Assist Level: Supervision or verbal cues Function - Lower Body Dressing/Undressing What is the patient wearing?: Non-skid slipper socks, Pants, Ted Hose Position: Sitting EOB Pants- Performed by patient: Thread/unthread right pants leg, Thread/unthread left pants leg Pants- Performed by helper: Pull pants up/down Non-skid slipper socks- Performed by patient: Don/doff right sock, Don/doff left sock Non-skid slipper socks- Performed by helper: Don/doff right sock, Don/doff left sock Socks - Performed by patient: Don/doff right sock Socks -  Performed by helper: Don/doff right sock, Don/doff left sock Shoes - Performed by patient: Don/doff right shoe, Don/doff left shoe Shoes - Performed by helper: Fasten right, Fasten left TED Hose - Performed by helper: Don/doff right TED hose, Don/doff left TED hose Assist for footwear: Partial/moderate assist Assist for lower body dressing: Touching or steadying assistance (Pt > 75%)  Function - Toileting Toileting activity did not occur: No continent bowel/bladder event Toileting steps completed by patient: Adjust clothing prior to toileting, Adjust clothing after toileting Toileting steps completed by helper: Adjust clothing prior to toileting, Performs perineal hygiene, Adjust clothing after toileting Toileting Assistive Devices: Grab bar or rail, Toilet aid Assist level: More than reasonable time, Set up/obtain supplies  Function - Air cabin crew transfer assistive device: Elevated toilet seat/BSC over toilet, Walker, Grab bar Assist level to toilet: Touching or steadying assistance (Pt > 75%) Assist level from toilet: Touching or steadying assistance (Pt > 75%)  Function - Chair/bed transfer Chair/bed transfer method: Stand pivot Chair/bed transfer assist level: Touching or steadying assistance (Pt > 75%) Chair/bed transfer assistive device: Armrests, Bedrails Chair/bed transfer details: Verbal cues for technique, Verbal cues for precautions/safety, Manual facilitation for placement, Verbal cues for safe use of DME/AE  Function - Locomotion: Wheelchair Will patient use wheelchair at discharge?: (TBD) Type: Manual Max wheelchair distance: 75 Assist Level: Supervision or verbal cues Assist Level: Supervision or verbal cues Wheel 150 feet activity did not occur: Safety/medical concerns Assist Level: Supervision or verbal cues Turns around,maneuvers to table,bed, and toilet,negotiates 3% grade,maneuvers on rugs and over doorsills: No Function - Locomotion:  Ambulation Assistive device: Walker-rolling Max distance: 10' Assist level: Moderate assist (Pt 50 - 74%) Assist level: Moderate assist (Pt 50 - 74%) Walk 50 feet with 2 turns activity did not occur: Safety/medical concerns Walk 150 feet activity did not occur: Safety/medical concerns Walk 10 feet on uneven surfaces activity did not occur: Safety/medical concerns  Function - Comprehension Comprehension: Auditory Comprehension assist level: Follows complex conversation/direction with extra time/assistive device  Function - Expression Expression: Verbal Expression assist level: Expresses complex ideas: With extra time/assistive device  Function - Social Interaction Social Interaction assist level: Interacts appropriately 90% of the time - Needs monitoring or encouragement for participation or interaction.  Function - Problem Solving Problem solving assist level: Solves basic problems with no assist  Function - Memory Memory assist level: Recognizes or recalls 90% of the time/requires cueing < 10% of the time Patient normally able to recall (first 3 days only): Current season, Location of own room, Staff names and faces, That he or she is in a hospital  Medical Problem List and Plan: 1.  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  Similar symptoms as last weak when repeat CT and MRI demonstarted no new stroke, neuro felt it may be TIA vs  Hypoperfusion, BP normal but running lower  that over the last couple days, will reduce  Lopressor to 12.69m BID, may do therapy.  Pt back to baseline but if this recurs consider heparin then warfarin- d/w pt he is aware and would like to avoid warfarin if possible  Believe hypoperfusion has been the main limitation rather than TIA given positive response to more permissive HTN    2.  DVT  Prophylaxis/Anticoagulation: Pharmaceutical: Lovenox 3. Idiopathic neuropathy/Pain Management: Gabapentin was added for LUE dysesthesias. Continue ultram prn as at home.  4. Mood: LCSW to follow for evaluation and support.  5. Neuropsych: This patient is capable of making decisions on his own behalf. 6. Skin/Wound Care: routine pressure relief measures.  7. Fluids/Electrolytes/Nutrition: Monitor I/O.  8. COPD: Increased albuterol to every 4 hours prn. Continue Onoro. Encourage IS to help with atelectasis 9.  Acute on chronic renal failure--stage IV:  Baseline SCr-3.65. On calcitriol for secondary hyperparathyroidism. Continue to monitor with serial checks. Avoid ARB/ No ACE due to allergy.   Currently at baseline  Encourage fluid intake           IVF daily at bedtime started on 8/8  10. H/o gastritis due to NSAIDs/ GERD: Continue PPI.  11. Dyslipidemia: on Lipitor.  12. CAD/AS: Treated medically with metoprolol, ASA and lipitor.  13. Peri anal cyst:  With acute on chronic inflammation. No prior treatment per reports. Started on Augmentin on 8/3 for  7-10 days treatment. Continue moist warm compresses to help drain.    14. ABLA on Anemia of chronic disease: Hgb- 11.2-12.5 range. Continue to monitor.   Hemoglobin 10.1 on 8/15 monitor weekly while hospitalized  Stool OB pos,is on Lovenox may need OP f/u with GI, no emergent issues 15. Constipation: Scheduled Senna S 2 at bedtime. 16.  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 Multifocal neuropathy, hx of sciatica, may benefit from outpt EMG to further evaluate 17. Blood pressure- will have goal of 130-140s- enc fluid hold lopressor for Sys BP <130   Vitals:   08/30/17 0509 08/30/17 0809  BP: 114/76   Pulse: 83 82  Resp: 20 20  Temp: (!) 97.4 F (36.3 C)   SpO2: 97% 96%   18. Urinary frequency  At night, likely related to IVF  PVRs ordered  Condom cath qhs until  PVRs/IVF  LOS (Days) 11 A FACE TO FACE EVALUATION WAS PERFORMED  ACharlett Blake8/16/2019, 9:45 AM

## 2017-08-30 NOTE — Progress Notes (Signed)
Occupational Therapy Session Note  Patient Details  Name: [name] MRN: [id number] Date of Birth: June 24, 1935  Today's Date: 08/30/2017 OT Individual Time: 0700-0800 OT Individual Time Calculation (min): 60 min    Short Term Goals: Week 2:  OT Short Term Goal 1 (Week 2): STG=LTG secondary to ELOS  Skilled Therapeutic Interventions/Progress Updates:    OT intervention with focus on functional transfers (stand pivot at min A); standing balance at sink, toileting and transfers, dressing tasks, and LUE table tasks to increase function and independence with BADLs. BP seated with Teds but no binder-131/84 with no s/s. Pt performed all transfers with min A but required tot A for toileting tasks and assistance with pulling up pants while standing.  Pt stood at sink for approx 60 seconds with noted fatigue in LLE and slight lean to L. Pt completed UB dressing tasks and threaded pants and donned socks without assistance.  Pt engaged in table activities with theraputty to strengthen L hand for functional tasks.  Pt remained seated in w/c with all needs within reach and belt alarm activated.   Therapy Documentation Precautions:  Precautions Precautions: Fall Restrictions Weight Bearing Restrictions: No Pain:  Pt denies pain  See Function Navigator for Current Functional Status.   Therapy/Group: Individual Therapy  Leroy Libman 08/30/2017, 8:56 AM

## 2017-08-31 ENCOUNTER — Inpatient Hospital Stay (HOSPITAL_COMMUNITY): Payer: Medicare Other | Admitting: Physical Therapy

## 2017-08-31 NOTE — Progress Notes (Signed)
Subjective/Complaints:  Discussed BP with therapy  ROS:  Denies CP SOB N/V/D  Objective: Vital Signs: Blood pressure 95/63, pulse 75, temperature (!) 97.5 F (36.4 C), temperature source Oral, resp. rate 18, height '5\' 9"'  (1.753 m), weight 93.2 kg, SpO2 97 %. No results found. Results for orders placed or performed during the hospital encounter of 08/19/17 (from the past 72 hour(s))  Basic metabolic panel     Status: Abnormal   Collection Time: 08/29/17  5:11 AM  Result Value Ref Range   Sodium 142 135 - 145 mmol/L   Potassium 4.3 3.5 - 5.1 mmol/L   Chloride 114 (H) 98 - 111 mmol/L   CO2 21 (L) 22 - 32 mmol/L   Glucose, Bld 109 (H) 70 - 99 mg/dL   BUN 64 (H) 8 - 23 mg/dL   Creatinine, Ser 3.53 (H) 0.61 - 1.24 mg/dL   Calcium 8.5 (L) 8.9 - 10.3 mg/dL   GFR calc non Af Amer 15 (L) >60 mL/min   GFR calc Af Amer 17 (L) >60 mL/min    Comment: (NOTE) The eGFR has been calculated using the CKD EPI equation. This calculation has not been validated in all clinical situations. eGFR's persistently <60 mL/min signify possible Chronic Kidney Disease.    Anion gap 7 5 - 15    Comment: Performed at [facility] [address] [place], [place] [address]  CBC     Status: Abnormal   Collection Time: 08/29/17  5:11 AM  Result Value Ref Range   WBC 7.9 4.0 - 10.5 K/uL   RBC 3.69 (L) 4.22 - 5.81 MIL/uL   Hemoglobin 10.1 (L) 13.0 - 17.0 g/dL   HCT 32.9 (L) 39.0 - 52.0 %   MCV 89.2 78.0 - 100.0 fL   MCH 27.4 26.0 - 34.0 pg   MCHC 30.7 30.0 - 36.0 g/dL   RDW 15.8 (H) 11.5 - 15.5 %   Platelets 202 150 - 400 K/uL    Comment: Performed at [facility] [address], [place], [place] [address]     HENT: Normocephalic, atraumatic Eyes: EOMI. No discharge. Cardio: RRR. No JVD. Resp: CTA B/L and unlabored GI: BS and ND Skin: Warm and dry. Intact. Neuro: Alert/Oriented Motor: Motor: Left delt 3-/5, bi, tri 2+/5, grip 3+/5  LLE: 4+/5 proximal 2/5 distally Musc/Skel:  No  edema or tenderness in extremities Gen NAD. Vital signs reviewed   Assessment/Plan: 1. 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 Physiatrist is providing close team supervision and 24 hour management of active medical problems listed below. Physiatrist and rehab team continue to assess barriers to discharge/monitor patient progress toward functional and medical goals. FIM: Function - Bathing Bathing activity did not occur: Refused Position: Sitting EOB Body parts bathed by patient: Left arm, Chest, Abdomen, Right arm(pt reported LB had been done by nursing) Body parts bathed by helper: Right arm, Buttocks, Back Assist Level: Supervision or verbal cues  Function- Upper Body Dressing/Undressing What is the patient wearing?: Pull over shirt/dress Pull over shirt/dress - Perfomed by patient: Put head through opening, Pull shirt over trunk, Thread/unthread right sleeve, Thread/unthread left sleeve Pull over shirt/dress - Perfomed by helper: Thread/unthread right sleeve, Thread/unthread left sleeve Button up shirt - Perfomed by patient: Thread/unthread right sleeve, Thread/unthread left sleeve, Pull shirt around back Button up shirt - Perfomed by helper: Button/unbutton shirt Assist Level: Supervision or verbal  cues Function - Lower Body Dressing/Undressing What is the patient wearing?: Non-skid slipper socks, Pants, Ted Hose Position: Sitting EOB Pants- Performed by patient: Thread/unthread right pants leg, Thread/unthread left pants leg Pants- Performed by helper: Pull pants up/down Non-skid slipper socks- Performed by patient: Don/doff right sock, Don/doff left sock Non-skid slipper socks- Performed by helper: Don/doff right sock, Don/doff left sock Socks - Performed by patient: Don/doff right sock Socks - Performed by helper: Don/doff right sock, Don/doff left  sock Shoes - Performed by patient: Don/doff right shoe, Don/doff left shoe Shoes - Performed by helper: Fasten right, Fasten left TED Hose - Performed by helper: Don/doff right TED hose, Don/doff left TED hose Assist for footwear: Partial/moderate assist Assist for lower body dressing: Touching or steadying assistance (Pt > 75%)  Function - Toileting Toileting activity did not occur: No continent bowel/bladder event Toileting steps completed by patient: Adjust clothing prior to toileting, Adjust clothing after toileting Toileting steps completed by helper: Adjust clothing prior to toileting, Performs perineal hygiene, Adjust clothing after toileting Toileting Assistive Devices: Grab bar or rail, Toilet aid Assist level: More than reasonable time, Set up/obtain supplies  Function - Air cabin crew transfer assistive device: Elevated toilet seat/BSC over toilet, Walker, Grab bar Assist level to toilet: Touching or steadying assistance (Pt > 75%) Assist level from toilet: Touching or steadying assistance (Pt > 75%)  Function - Chair/bed transfer Chair/bed transfer method: Stand pivot Chair/bed transfer assist level: Touching or steadying assistance (Pt > 75%) Chair/bed transfer assistive device: Walker Chair/bed transfer details: Verbal cues for technique, Verbal cues for precautions/safety, Manual facilitation for placement, Verbal cues for safe use of DME/AE  Function - Locomotion: Wheelchair Will patient use wheelchair at discharge?: (TBD) Type: Manual Max wheelchair distance: 150' Assist Level: Supervision or verbal cues Assist Level: Supervision or verbal cues Wheel 150 feet activity did not occur: Safety/medical concerns Assist Level: Supervision or verbal cues Turns around,maneuvers to table,bed, and toilet,negotiates 3% grade,maneuvers on rugs and over doorsills: No Function - Locomotion: Ambulation Assistive device: Walker-rolling Max distance: 20' Assist level:  Moderate assist (Pt 50 - 74%) Assist level: Moderate assist (Pt 50 - 74%) Walk 50 feet with 2 turns activity did not occur: Safety/medical concerns Walk 150 feet activity did not occur: Safety/medical concerns Walk 10 feet on uneven surfaces activity did not occur: Safety/medical concerns  Function - Comprehension Comprehension: Auditory Comprehension assist level: Follows complex conversation/direction with extra time/assistive device  Function - Expression Expression: Verbal Expression assist level: Expresses complex ideas: With extra time/assistive device  Function - Social Interaction Social Interaction assist level: Interacts appropriately 90% of the time - Needs monitoring or encouragement for participation or interaction.  Function - Problem Solving Problem solving assist level: Solves basic problems with no assist  Function - Memory Memory assist level: Recognizes or recalls 90% of the time/requires cueing < 10% of the time Patient normally able to recall (first 3 days only): Current season, Location of own room, Staff names and faces, That he or she is in a hospital  Medical Problem List and Plan: 1.  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  Similar symptoms as last weak when repeat CT and MRI demonstarted no new stroke, neuro felt it may be TIA vs  Hypoperfusion, BP normal but running lower that over the last couple days, will reduce  Lopressor to 12.75m BID,  may do therapy.  Pt back to baseline but if this recurs consider heparin then warfarin- d/w pt he is aware and would like to avoid warfarin if possible  Believe hypoperfusion has been the main limitation rather than TIA given positive response to more permissive HTN    2.  DVT Prophylaxis/Anticoagulation: Pharmaceutical: Lovenox 3. Idiopathic  neuropathy/Pain Management: Gabapentin was added for LUE dysesthesias. Continue ultram prn as at home.  4. Mood: LCSW to follow for evaluation and support.  5. Neuropsych: This patient is capable of making decisions on his own behalf. 6. Skin/Wound Care: routine pressure relief measures.  7. Fluids/Electrolytes/Nutrition: Monitor I/O.  8. COPD: Increased albuterol to every 4 hours prn. Continue Onoro. Encourage IS to help with atelectasis 9.  Acute on chronic renal failure--stage IV:  Baseline SCr-3.65. On calcitriol for secondary hyperparathyroidism. Continue to monitor with serial checks. Avoid ARB/ No ACE due to allergy.   Currently at baseline  Encourage fluid intake           IVF daily at bedtime started on 8/8  10. H/o gastritis due to NSAIDs/ GERD: Continue PPI.  11. Dyslipidemia: on Lipitor.  12. CAD/AS: Treated medically with metoprolol, ASA and lipitor.  13. Peri anal cyst:  With acute on chronic inflammation. No prior treatment per reports. Started on Augmentin on 8/3 for  7-10 days treatment. Continue moist warm compresses to help drain.    14. ABLA on Anemia of chronic disease: Hgb- 11.2-12.5 range. Continue to monitor.   Hemoglobin 10.1 on 8/15 monitor weekly while hospitalized  Stool OB pos,is on Lovenox may need OP f/u with GI, no emergent issues 15. Constipation: Scheduled Senna S 2 at bedtime. 16.  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 Multifocal neuropathy, hx of sciatica, may benefit from outpt EMG to further evaluate 17. Blood pressure- will have goal of 130-140s- enc fluid hold lopressor for Sys BP <130  Will stop metoprolol although pt not currently symptomatic- monitor HR Vitals:   08/31/17 0826 08/31/17 0900  BP: 125/60 95/63  Pulse: 75   Resp: 18   Temp:    SpO2: 97%    18. Urinary frequency  At night, likely related to IVF  PVRs ordered  Condom cath qhs until PVRs/IVF  LOS (Days) 12 A FACE  TO FACE EVALUATION WAS PERFORMED  Charlett Blake 08/31/2017, 9:44 AM

## 2017-08-31 NOTE — Progress Notes (Addendum)
Physical Therapy Session Note  Patient Details  Name: [name] MRN: [id number] Date of Birth: 05-28-1935  Today's Date: 08/31/2017 PT Individual Time: 0800-0900 PT Individual Time Calculation (min): 60 min   Short Term Goals: Week 2:  PT Short Term Goal 1 (Week 2): STG=LTG due to ELOS  Skilled Therapeutic Interventions/Progress Updates:   Pt in supine and agreeable to therapy, denies pain. Eager to get OOB. Session focused on functional mobility and OOB tolerance. Transferred to EOB w/ supervision, no bedrail use. BP 93/62 after transition, up to 109/56 after a few minutes, denies dizziness/lightheadedness. Total assist to don ted hose, LE garments, and shoes for time management. Min assist to don shirt and abdominal binder. Transferred to w/c via stand pivot w/ RW, min assist. Pt w/ improved postural control and LLE management this session during transfer. Self-propelled w/c to therapy gym to work on independence w/ locomotion, supervision using R hemi technique. BP 125/60 after activity. Attempted to work on postural control w/ sitting balance, however BP systolic staying around 675 after transfer to edge of mat. Returned to room and transferred to recliner, min assist. Reclined back and feet up, BP 95/63 after a few minutes of rest, pt remained non-symptomatic throughout entire session however deferred further activity 2/2 low BP. Educated pt on importance of continuing to work on upright/OOB tolerance, he states he feels better in recliner anyway. RN made aware of status and agreeable for pt to remain at rest reclined in recliner.   Addendum: Returned to room in afternoon to check on tolerance to OOB activity. Wife and daughter present asking therapist about extending pt's stay, they state they have friends who had stays extended with the same insurance. Wife says she cannot take care of him at this level. Reassured both that treatment team would get together on Wednesday, in addition to daily  communication, regarding best and safest d/c plan. Pt very against possibility of SNF placement, however wife seemed more agreeable. Told both that MD would need to approve an extended stay and that it's not for certain, both verbalized understanding.   Therapy Documentation Precautions:  Precautions Precautions: Fall Restrictions Weight Bearing Restrictions: No Vital Signs: Therapy Vitals Temp: (!) 97.5 F (36.4 C) Temp Source: Oral Pulse Rate: 75 Resp: 18 BP: 95/63 Patient Position (if appropriate): Sitting(reclined in recliner, feet up) Oxygen Therapy SpO2: 97 % O2 Device: Room Air  See Function Navigator for Current Functional Status.   Therapy/Group: Individual Therapy  Hasaan Radde K Arnette 08/31/2017, 9:02 AM

## 2017-09-01 ENCOUNTER — Inpatient Hospital Stay (HOSPITAL_COMMUNITY): Payer: Medicare Other | Admitting: Occupational Therapy

## 2017-09-01 NOTE — Progress Notes (Signed)
Occupational Therapy Session Note  Patient Details  Name: [name] MRN: [id number] Date of Birth: December 02, 1935  Today's Date: 09/01/2017 OT Group Time: 1100-1200 OT Group Time Calculation (min): 60 min   Skilled Therapeutic Interventions/Progress Updates:    Pt participated in therapeutic w/c level dance group with focus on UE/LE strengthening, activity tolerance, and social participation for carryover during self care tasks. Pt was guided through various dance-based exercises involving UB/LB and trunk. Emphasis placed on Lt NMR and postural control. He sat edge of chair as tolerated for trunk strengthening. He was able to bilateral integrate UEs/LEs for NMR, with active assist for L UE when fatigued. Pt smiling and singing to familiar songs. At end of session he was taken back to room by NT.   Therapy Documentation Precautions:  Precautions Precautions: Fall Restrictions Weight Bearing Restrictions: No Oxygen Therapy SpO2: 96 % O2 Device: Room Air Pain: No c/o pain during tx    ADL:      See Function Navigator for Current Functional Status.   Therapy/Group: Group Therapy  Lulie Hurd A Kaedyn Belardo 09/01/2017, 12:30 PM

## 2017-09-01 NOTE — Progress Notes (Signed)
Subjective/Complaints:  Discussed BP with pt also discussed IVF  ROS:  Denies CP SOB N/V/D  Objective: Vital Signs: Blood pressure 109/65, pulse 96, temperature 98.6 F (37 C), temperature source Oral, resp. rate 18, height 5\' 9"  (1.753 m), weight 93.2 kg, SpO2 96 %. No results found. No results found for this or any previous visit (from the past 72 hour(s)).   HENT: Normocephalic, atraumatic Eyes: EOMI. No discharge. Cardio: RRR. No JVD. Resp: CTA B/L and unlabored GI: BS and ND Skin: Warm and dry. Intact. Neuro: Alert/Oriented Motor: Motor: Left delt 3-/5, bi, tri 2+/5, grip 3+/5  LLE: 4+/5 proximal 2/5 distally Musc/Skel:  No edema or tenderness in extremities Gen NAD. Vital signs reviewed   Assessment/Plan: 1. 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 Physiatrist is providing close team supervision and 24 hour management of active medical problems listed below. Physiatrist and rehab team continue to assess barriers to discharge/monitor patient progress toward functional and medical goals. FIM: Function - Bathing Bathing activity did not occur: Refused Position: Sitting EOB Body parts bathed by patient: Left arm, Chest, Abdomen, Right arm(pt reported LB had been done by nursing) Body parts bathed by helper: Right arm, Buttocks, Back Assist Level: Supervision or verbal cues  Function- Upper Body Dressing/Undressing What is the patient wearing?: Pull over shirt/dress Pull over shirt/dress - Perfomed by patient: Put head through opening, Pull shirt over trunk, Thread/unthread right sleeve, Thread/unthread left sleeve Pull over shirt/dress - Perfomed by helper: Thread/unthread right sleeve, Thread/unthread left sleeve Button up shirt - Perfomed by patient: Thread/unthread right sleeve, Thread/unthread left sleeve, Pull shirt around back Button up shirt -  Perfomed by helper: Button/unbutton shirt Assist Level: Supervision or verbal cues Function - Lower Body Dressing/Undressing What is the patient wearing?: Non-skid slipper socks, Pants, Ted Hose Position: Sitting EOB Pants- Performed by patient: Thread/unthread right pants leg, Thread/unthread left pants leg Pants- Performed by helper: Pull pants up/down Non-skid slipper socks- Performed by patient: Don/doff right sock, Don/doff left sock Non-skid slipper socks- Performed by helper: Don/doff right sock, Don/doff left sock Socks - Performed by patient: Don/doff right sock Socks - Performed by helper: Don/doff right sock, Don/doff left sock Shoes - Performed by patient: Don/doff right shoe, Don/doff left shoe Shoes - Performed by helper: Fasten right, Fasten left TED Hose - Performed by helper: Don/doff right TED hose, Don/doff left TED hose Assist for footwear: Partial/moderate assist Assist for lower body dressing: Touching or steadying assistance (Pt > 75%)  Function - Toileting Toileting activity did not occur: No continent bowel/bladder event Toileting steps completed by patient: Adjust clothing prior to toileting, Adjust clothing after toileting Toileting steps completed by helper: Adjust clothing prior to toileting, Performs perineal hygiene, Adjust clothing after toileting Toileting Assistive Devices: Grab bar or rail, Toilet aid Assist level: More than reasonable time, Set up/obtain supplies  Function - Air cabin crew transfer assistive device: Elevated toilet seat/BSC over toilet, Walker, Grab bar Assist level to toilet: Touching or steadying assistance (Pt > 75%) Assist level from toilet: Touching or steadying assistance (Pt > 75%)  Function - Chair/bed transfer Chair/bed transfer method: Stand pivot Chair/bed transfer assist level: Touching or steadying assistance (Pt > 75%) Chair/bed transfer assistive device: Walker Chair/bed transfer details: Verbal cues for  technique, Verbal cues for precautions/safety, Manual facilitation for placement, Verbal cues for safe use of DME/AE  Function - Locomotion: Wheelchair Will patient use wheelchair at  discharge?: (TBD) Type: Manual Max wheelchair distance: 150' Assist Level: Supervision or verbal cues Assist Level: Supervision or verbal cues Wheel 150 feet activity did not occur: Safety/medical concerns Assist Level: Supervision or verbal cues Turns around,maneuvers to table,bed, and toilet,negotiates 3% grade,maneuvers on rugs and over doorsills: No Function - Locomotion: Ambulation Assistive device: Walker-rolling Max distance: 20' Assist level: Moderate assist (Pt 50 - 74%) Assist level: Moderate assist (Pt 50 - 74%) Walk 50 feet with 2 turns activity did not occur: Safety/medical concerns Walk 150 feet activity did not occur: Safety/medical concerns Walk 10 feet on uneven surfaces activity did not occur: Safety/medical concerns  Function - Comprehension Comprehension: Auditory Comprehension assist level: Follows complex conversation/direction with extra time/assistive device  Function - Expression Expression: Verbal Expression assist level: Expresses complex ideas: With extra time/assistive device  Function - Social Interaction Social Interaction assist level: Interacts appropriately 90% of the time - Needs monitoring or encouragement for participation or interaction.  Function - Problem Solving Problem solving assist level: Solves basic problems with no assist  Function - Memory Memory assist level: Recognizes or recalls 90% of the time/requires cueing < 10% of the time Patient normally able to recall (first 3 days only): Current season, Location of own room, Staff names and faces, That he or she is in a hospital  Medical Problem List and Plan: 1.  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  Similar symptoms as last weak when repeat CT and MRI demonstarted no new stroke, neuro felt it may be TIA vs  Hypoperfusion, BP normal but running lower that over the last couple days, will reduce  Lopressor to 12.5mg  BID, may do therapy.  Pt back to baseline but if this recurs consider heparin then warfarin- d/w pt he is aware and would like to avoid warfarin if possible  Believe hypoperfusion has been the main limitation rather than TIA given positive response to more permissive HTN    2.  DVT Prophylaxis/Anticoagulation: Pharmaceutical: Lovenox 3. Idiopathic neuropathy/Pain Management: Gabapentin was added for LUE dysesthesias. Continue ultram prn as at home.  4. Mood: LCSW to follow for evaluation and support.  5. Neuropsych: This patient is capable of making decisions on his own behalf. 6. Skin/Wound Care: routine pressure relief measures.  7. Fluids/Electrolytes/Nutrition: Monitor I/O. D/C IV enc po in preparation for home d/c 8. COPD: Increased albuterol to every 4 hours prn. Continue Onoro. Encourage IS to help with atelectasis 9.  Acute on chronic renal failure--stage IV:  Baseline SCr-3.65. On calcitriol for secondary hyperparathyroidism. Continue to monitor with serial checks. Avoid ARB/ No ACE due to allergy.   Currently at baseline  Encourage fluid intake           IVF daily at bedtime started on 8/8  10. H/o gastritis due to NSAIDs/ GERD: Continue PPI.  11. Dyslipidemia: on Lipitor.  12. CAD/AS: Treated medically with metoprolol, ASA and lipitor.  13. Peri anal cyst:  With acute on chronic inflammation. No prior treatment per reports. Started on Augmentin on 8/3 for  7-10 days treatment. Continue moist warm compresses to help drain.    14. ABLA on Anemia of chronic disease: Hgb- 11.2-12.5 range. Continue to monitor.   Hemoglobin 10.1 on 8/15 monitor weekly while hospitalized  Stool OB pos,is on Lovenox may  need OP f/u with GI, no emergent issues 15. Constipation: Scheduled Senna  S 2 at bedtime. 16.  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 Multifocal neuropathy, hx of sciatica, may benefit from outpt EMG to further evaluate 17. Blood pressure- will have goal of 130-140s- enc fluid hold lopressor for Sys BP <130  Will stop metoprolol although pt not currently symptomatic- HR a little higher goal is <105bpm Vitals:   09/01/17 0603 09/01/17 0859  BP: 109/65   Pulse: 96   Resp: 18   Temp:    SpO2: 94% 96%   18. Urinary frequency  At night, likely related to IVF  PVRs ordered  Condom cath qhs until PVRs/IVF  LOS (Days) 13 A FACE TO FACE EVALUATION WAS PERFORMED  Charlett Blake 09/01/2017, 10:16 AM

## 2017-09-02 ENCOUNTER — Inpatient Hospital Stay (HOSPITAL_COMMUNITY): Payer: Medicare Other

## 2017-09-02 NOTE — Progress Notes (Signed)
Social Work Patient ID: [name], male   DOB: [birth year], 82 y.o.   MRN: [id number]  Met with pt, wife and granddaughter who was here to discuss pt's progress and needs at discharge. Wife is concerned about pt's care and she is not able to provide any care to him distance supervision is all. Have arranged for her to come in and attend therapiies with him on Wed at 1:00 pm. She wants to know if he can stay here longer due to missed therapy session due to his BP issues. Will ask team and MD and if can not then may need to pursue other options. Will see how Wed therapy session goes.

## 2017-09-02 NOTE — Progress Notes (Signed)
Subjective/Complaints:  Slept poorly, abd discomfort relieved by BM  ROS:  Denies CP SOB N/V/D  Objective: Vital Signs: Blood pressure 133/80, pulse (!) 103, temperature 98.1 F (36.7 C), resp. rate 17, height 5\' 9"  (1.753 m), weight 93.2 kg, SpO2 100 %. No results found. No results found for this or any previous visit (from the past 72 hour(s)).   HENT: Normocephalic, atraumatic Eyes: EOMI. No discharge. Cardio: RRR. No JVD. Resp: CTA B/L and unlabored GI: BS and ND Skin: Warm and dry. Intact. Neuro: Alert/Oriented Motor: Motor: Left delt 3-/5, bi, tri 2+/5, grip 3+/5  LLE: 4+/5 proximal 2/5 distally Musc/Skel:  No edema or tenderness in extremities Gen NAD. Vital signs reviewed   Assessment/Plan: 1. 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 Physiatrist is providing close team supervision and 24 hour management of active medical problems listed below. Physiatrist and rehab team continue to assess barriers to discharge/monitor patient progress toward functional and medical goals. FIM: Function - Bathing Bathing activity did not occur: Refused Position: Sitting EOB Body parts bathed by patient: Left arm, Chest, Abdomen, Right arm(pt reported LB had been done by nursing) Body parts bathed by helper: Right arm, Buttocks, Back Assist Level: Supervision or verbal cues  Function- Upper Body Dressing/Undressing What is the patient wearing?: Pull over shirt/dress Pull over shirt/dress - Perfomed by patient: Put head through opening, Pull shirt over trunk, Thread/unthread right sleeve, Thread/unthread left sleeve Pull over shirt/dress - Perfomed by helper: Thread/unthread right sleeve, Thread/unthread left sleeve Button up shirt - Perfomed by patient: Thread/unthread right sleeve, Thread/unthread left sleeve, Pull shirt around back Button up shirt - Perfomed by  helper: Button/unbutton shirt Assist Level: Supervision or verbal cues Function - Lower Body Dressing/Undressing What is the patient wearing?: Non-skid slipper socks, Pants, Ted Hose Position: Sitting EOB Pants- Performed by patient: Thread/unthread right pants leg, Thread/unthread left pants leg Pants- Performed by helper: Pull pants up/down Non-skid slipper socks- Performed by patient: Don/doff right sock, Don/doff left sock Non-skid slipper socks- Performed by helper: Don/doff right sock, Don/doff left sock Socks - Performed by patient: Don/doff right sock Socks - Performed by helper: Don/doff right sock, Don/doff left sock Shoes - Performed by patient: Don/doff right shoe, Don/doff left shoe Shoes - Performed by helper: Fasten right, Fasten left TED Hose - Performed by helper: Don/doff right TED hose, Don/doff left TED hose Assist for footwear: Partial/moderate assist Assist for lower body dressing: Touching or steadying assistance (Pt > 75%)  Function - Toileting Toileting activity did not occur: No continent bowel/bladder event Toileting steps completed by patient: Adjust clothing prior to toileting, Adjust clothing after toileting Toileting steps completed by helper: Adjust clothing prior to toileting, Performs perineal hygiene, Adjust clothing after toileting Toileting Assistive Devices: Grab bar or rail, Toilet aid Assist level: More than reasonable time, Set up/obtain supplies  Function - Air cabin crew transfer assistive device: Elevated toilet seat/BSC over toilet, Walker, Grab bar Assist level to toilet: Touching or steadying assistance (Pt > 75%) Assist level from toilet: Touching or steadying assistance (Pt > 75%)  Function - Chair/bed transfer Chair/bed transfer method: Stand pivot Chair/bed transfer assist level: Touching or steadying assistance (Pt > 75%) Chair/bed transfer assistive device: Walker Chair/bed transfer details: Verbal cues for technique,  Verbal cues for precautions/safety, Manual facilitation for placement, Verbal cues for safe use of DME/AE  Function - Locomotion: Wheelchair Will patient use wheelchair at discharge?: (TBD)  Type: Manual Max wheelchair distance: 150' Assist Level: Supervision or verbal cues Assist Level: Supervision or verbal cues Wheel 150 feet activity did not occur: Safety/medical concerns Assist Level: Supervision or verbal cues Turns around,maneuvers to table,bed, and toilet,negotiates 3% grade,maneuvers on rugs and over doorsills: No Function - Locomotion: Ambulation Assistive device: Walker-rolling Max distance: 20' Assist level: Moderate assist (Pt 50 - 74%) Assist level: Moderate assist (Pt 50 - 74%) Walk 50 feet with 2 turns activity did not occur: Safety/medical concerns Walk 150 feet activity did not occur: Safety/medical concerns Walk 10 feet on uneven surfaces activity did not occur: Safety/medical concerns  Function - Comprehension Comprehension: Auditory Comprehension assist level: Follows complex conversation/direction with extra time/assistive device  Function - Expression Expression: Verbal Expression assist level: Expresses complex ideas: With extra time/assistive device  Function - Social Interaction Social Interaction assist level: Interacts appropriately 90% of the time - Needs monitoring or encouragement for participation or interaction.  Function - Problem Solving Problem solving assist level: Solves basic problems with no assist  Function - Memory Memory assist level: Recognizes or recalls 90% of the time/requires cueing < 10% of the time Patient normally able to recall (first 3 days only): Current season, Location of own room, Staff names and faces, That he or she is in a hospital  Medical Problem List and Plan: 1.  Left hemiparesis, limitations with self-care, balance deficits secondary to right medullary infarct with buttocks cyst continue CIR PT OT  RIght  meduallary infarct in setting of partially thrombosed BA aneurysm.     2.  DVT Prophylaxis/Anticoagulation: Pharmaceutical: Lovenox 3. Idiopathic neuropathy/Pain Management: Gabapentin was added for LUE dysesthesias. Continue ultram prn as at home.  4. Mood: LCSW to follow for evaluation and support.  5. Neuropsych: This patient is capable of making decisions on his own behalf. 6. Skin/Wound Care: routine pressure relief measures.  7. Fluids/Electrolytes/Nutrition: Monitor I/O. D/C IV enc po in preparation for home d/c 8. COPD: Increased albuterol to every 4 hours prn. Continue Onoro. Encourage IS to help with atelectasis 9.  Acute on chronic renal failure--stage IV:  Baseline SCr-3.65. On calcitriol for secondary hyperparathyroidism. Continue to monitor with serial checks. Avoid ARB/ No ACE due to allergy.   Currently at baseline  Encourage fluid intake           IVF daily at bedtime started on 8/8  10. H/o gastritis due to NSAIDs/ GERD: Continue PPI.  11. Dyslipidemia: on Lipitor.  12. CAD/AS: Treated medically with metoprolol, ASA and lipitor.  13. Peri anal cyst:  With acute on chronic inflammation. No prior treatment per reports. Started on Augmentin on 8/3 for  7-10 days treatment. Continue moist warm compresses to help drain.    14. ABLA on Anemia of chronic disease: Hgb- 11.2-12.5 range. Continue to monitor.   Hemoglobin 10.1 on 8/15 monitor weekly while hospitalized  Stool OB pos,is on Lovenox may need OP f/u with GI, no emergent issues 15. Constipation: Scheduled Senna S 2 at bedtime. 16.  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 Multifocal neuropathy, hx of sciatica, may benefit from outpt EMG to further evaluate 17. Blood pressure- will have goal of 130-140s- enc fluid hold lopressor for Sys BP <130  Will stop metoprolol although pt not currently symptomatic- HR a little higher goal is <105bpm Vitals:   09/02/17 0546  09/02/17 0548  BP: 133/90 133/80  Pulse: 96 (!) 103  Resp: 17  Temp: 98.1 F (36.7 C)   SpO2: 98% 100%   18. Urinary frequency  Pt drinking a lot of fluid will d/c IV  PVRs ordered  Condom cath qhs until PVRs/IVF  LOS (Days) 14 A FACE TO FACE EVALUATION WAS PERFORMED  Charlett Blake 09/02/2017, 8:45 AM

## 2017-09-02 NOTE — Progress Notes (Signed)
Physical Therapy Session Note  Patient Details  Name: [name] MRN: [id number] Date of Birth: [birth year]  Today's Date: 09/02/2017 PT Individual Time: 1300-1345 PT Individual Time Calculation (min): 45 min   Short Term Goals: Week 2:  PT Short Term Goal 1 (Week 2): STG=LTG due to ELOS  Skilled Therapeutic Interventions/Progress Updates:    Pt supine in bed upon PT arrival, agreeable to therapy tx and denies pain. Pt transferred to EOB with supervision and performed sit<>stand with RW and min assist, stand pivot to w/c min assist. BP 137/83. Pt propelled w/c to the gym with supervision using B UEs/LEs. Pt ambulated 2 x 20 ft with RW and mod assist, verbal cues for RW management. BP 148/92 following ambulation. Pt ascended/descended 1 step (6 inch) x 2 with mod assist and B handrails, verbal cues for techniques. Pt performed x 10 sidesteps with L LE in standing with RW, min assist working on hip strength and L NMR. BP 136/83. Pt performed stand pivot transfer from w/c<>rehab apartment bed with RW and min assist, verbal cues for safety. Pt transported back to room and performed stand pivot back to recliner with RW and min assist, verbal cues for safety. Pt left seated with chair alarm set and needs in reach.   Therapy Documentation Precautions:  Precautions Precautions: Fall Restrictions Weight Bearing Restrictions: No   See Function Navigator for Current Functional Status.   Therapy/Group: Individual Therapy  Netta Corrigan, PT, DPT 09/02/2017, 8:25 AM

## 2017-09-02 NOTE — Progress Notes (Signed)
Occupational Therapy Session Note  Patient Details  Name: [name] MRN: [id number] Date of Birth: September 30, 1935  Today's Date: 09/02/2017 OT Individual Time: 0700-0825 OT Individual Time Calculation (min): 85 min    Short Term Goals: Week 2:  OT Short Term Goal 1 (Week 2): STG=LTG secondary to ELOS  Skilled Therapeutic Interventions/Progress Updates:    OT intervention with focus on bed mobility, sitting balance, functional transfers, BADL retraining, increased LUE functional use, standing balance, activity tolerance, and safety awareness to increase independence with BADLs. Pt sat EOB and maintained sitting balance with supervision BP-126/98 without binder and Ted hose. Pt performed stand pivot transfer with RW at min A level.  Pt engaged in bathing at shower level and dressing with sit<>stand from w/c at sink.  Pt required steady A for standing balance in shower and standing at sink.  Pt requires more than a reasonable amount of time to complete all tasks with multiple rest breaks.  Pt gets SOB quickly, requiring rest breaks. Pt requires mod verbal cues for safety awareness when standing and during transfers. Pt performs stand pivot transfers with grab bars at min A. Pt required assistance pulling up pants after toileting. Pt remained in w/c with bel alarm activated and NT present.  All needs within reach.   Therapy Documentation Precautions:  Precautions Precautions: Fall Restrictions Weight Bearing Restrictions: No   Pain:  Pt denies pain  See Function Navigator for Current Functional Status.   Therapy/Group: Individual Therapy  Leroy Libman 09/02/2017, 10:28 AM

## 2017-09-03 ENCOUNTER — Inpatient Hospital Stay (HOSPITAL_COMMUNITY): Payer: Medicare Other

## 2017-09-03 ENCOUNTER — Inpatient Hospital Stay (HOSPITAL_COMMUNITY): Payer: Self-pay

## 2017-09-03 ENCOUNTER — Inpatient Hospital Stay (HOSPITAL_COMMUNITY): Payer: Self-pay | Admitting: Physical Therapy

## 2017-09-03 MED ORDER — ALBUTEROL SULFATE (2.5 MG/3ML) 0.083% IN NEBU
3.0000 mL | INHALATION_SOLUTION | Freq: Two times a day (BID) | RESPIRATORY_TRACT | Status: DC
  Administered 2017-09-03 – 2017-09-07 (×11): 3 mL via RESPIRATORY_TRACT
  Filled 2017-09-03 (×13): qty 3

## 2017-09-03 NOTE — Discharge Summary (Signed)
Physician Discharge Summary  Patient ID: [name] MRN: [id number] DOB/AGE: 1935/10/04 82 y.o.  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  Asymptomatic. EXAM: CT ABDOMEN AND PELVIS WITHOUT CONTRAST TECHNIQUE: Multidetector CT imaging of the abdomen and pelvis was performed following the standard protocol without IV contrast. COMPARISON:  02/07/2011 FINDINGS: Lower chest: Normal heart size without pericardial effusion. Subsegmental atelectasis at each lung base. No effusion or pneumothorax. Hepatobiliary: Subtle surface nodularity of the liver may represent morphologic changes of cirrhosis. Given limitations of a noncontrast CT, no space-occupying mass of the liver is apparent. No biliary dilatation. Gallbladder is decompressed without stones. Pancreas: Normal Spleen: No splenomegaly Adrenals/Urinary Tract: Normal bilateral adrenal glands. Redemonstration of bilateral renal cysts likely representing stigmata of polycystic renal disease. No definite solid lesions identified though study is limited by lack of IV contrast. No nephrolithiasis nor hydroureteronephrosis. The urinary bladder is unremarkable. Stomach/Bowel: Moderate distention of the stomach with ingested food. The duodenal sweep and ligament of Treitz are normal. No small bowel obstruction or inflammation. The distal and terminal ileum are unremarkable. Stool retention is seen throughout the colon with descending and sigmoid colonic  diverticulosis identified. No large bowel obstruction or definite annular constricting lesions. Normal-appearing appendix. Vascular/Lymphatic: Moderate aortoiliac atherosclerosis without aneurysm. No lymphadenopathy. Reproductive: Enlarged prostate measuring approximately 6.6 cm diameter. This impresses upon the base of the bladder. Other: No free air nor free fluid. Musculoskeletal: Grade 1 anterolisthesis of L4 on L5 likely on the basis of degenerative disc and facet arthropathy. More severe disc flattening at L5-S1 with facet arthropathy. No aggressive osseous lesions. IMPRESSION: 1. Surface nodularity of the liver which may represent morphologic changes of cirrhosis. 2. Redemonstration of multiple bilateral renal cysts likely representing polycystic renal disease. 3. Enlarged prostate. 4. Descending and sigmoid colonic diverticulosis without acute diverticulitis. 5. Lower lumbar degenerative disc and facet arthropathy. Grade 1 anterolisthesis of L4 on L5. Electronically Signed   By: Ashley Royalty M.D.   On: 09/06/2017 20:12   Dg Chest 2 View  Result Date: 08/19/2017 CLINICAL DATA:  Wheezing. EXAM: CHEST - 2 VIEW COMPARISON:  Radiograph of August 13, 2017. FINDINGS: The heart size and mediastinal contours are within normal limits. Both lungs are clear. The visualized skeletal structures are unremarkable. IMPRESSION: No active cardiopulmonary disease. Electronically Signed   By: Marijo Conception, M.D.   On: 08/19/2017 17:02   Ct Head Wo Contrast  Result Date: 08/13/2017 CLINICAL DATA:  Acute presentation with severe dizziness. EXAM: CT HEAD WITHOUT CONTRAST TECHNIQUE: Contiguous axial images were obtained from the base of the skull through the vertex without intravenous contrast. COMPARISON:  None. FINDINGS: Brain: Generalized atrophy. No sign of acute infarction, mass lesion, hemorrhage, hydrocephalus or extra-axial collection. Vascular: Atherosclerotic disease with severe dolichoectasia of the vertebrobasilar  system. Vessel diameter up to 14 mm in diameter either at the distal vertebral or proximal basilar artery. Lesser ectasia of the supraclinoid internal carotid arteries. Skull: Normal Sinuses/Orbits: Clear sinuses. No fluid in the middle  ears or mastoids. Orbits negative. Other: None IMPRESSION: No acute finding. The patient does have advanced atherosclerotic disease with dolichoectasia of the intracranial vessels, including dilatation of either the distal vertebral artery or the proximal basilar artery to a diameter of 14 mm. Given this, the possibility of acute posterior circulation ischemia should be considered given this presentation, though there is no discrete CT finding presently. Electronically Signed   By: Nelson Chimes M.D.   On: 08/13/2017 14:42   Mr Jodene Nam Head Wo Contrast  Result Date: 08/22/2017 CLINICAL DATA:  Stroke follow-up.  Left-sided weakness. EXAM: MRI HEAD WITHOUT CONTRAST MRA HEAD WITHOUT CONTRAST TECHNIQUE: Multiplanar, multiecho pulse sequences of the brain and surrounding structures were obtained without intravenous contrast. Angiographic images of the head were obtained using MRA technique without contrast. COMPARISON:  Head CT 08/21/2017 FINDINGS: MRI HEAD FINDINGS BRAIN: The midline structures are normal. Small area of diffusion restriction at the site of the previously identified right medullary infarct. No new site of ischemia. Old infarcts of the right frontal lobe and left cerebellum. Generalized atrophy without lobar predilection. Blood-sensitive sequences show no chronic microhemorrhage or superficial siderosis. SKULL AND UPPER CERVICAL SPINE: The visualized skull base, calvarium, upper cervical spine and extracranial soft tissues are normal. SINUSES/ORBITS: No fluid levels or advanced mucosal thickening. No mastoid or middle ear effusion. The orbits are normal. MRA HEAD FINDINGS Intracranial internal carotid arteries: Moderate-to-severe stenosis of the petrous segment of the right  internal carotid artery. There is generalized dilatation of both internal carotid arteries at the skull base. Anterior cerebral arteries: Normal. Middle cerebral arteries: Normal. Posterior communicating arteries: Present bilaterally. Posterior cerebral arteries: Normal. 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 Superior cerebellar arteries: Normal. Anterior inferior cerebellar arteries: Normal. Posterior inferior cerebellar arteries: Normal. IMPRESSION: 1. Redemonstration of now subacute right medullary infarct without hemorrhage or mass effect. 2. 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 3. 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 This measures approximately 67%. Electronically Signed   By: Ulyses Jarred M.D.   On: 08/22/2017 00:13     Labs:  Basic Metabolic Panel: BMP Latest Ref Rng & Units 09/06/2017 09/04/2017 08/29/2017  Glucose 70 - 99 mg/dL 96 100(H) 109(H)  BUN 8 - 23 mg/dL 64(H) 68(H) 64(H)  Creatinine 0.61 - 1.24 mg/dL 3.60(H) 3.85(H) 3.53(H)  Sodium 135 - 145 mmol/L 143 145 142  Potassium 3.5 - 5.1 mmol/L 3.9 4.5 4.3  Chloride 98 - 111 mmol/L 117(H) 119(H) 114(H)  CO2 22 - 32 mmol/L 19(L) 20(L) 21(L)  Calcium 8.9 - 10.3 mg/dL 8.4(L) 8.4(L) 8.5(L)    CBC: Recent Labs  Lab 09/05/17 0502 09/05/17 0907 09/06/17 0528 09/06/17 2019  WBC 5.9 6.5 5.9  --   HGB 7.7* 7.9* 7.3* 9.4*  HCT 25.1* 26.2* 24.2* 30.4*  MCV 90.6 90.7 90.6  --   PLT 219 204 204  --     CBG: No results for input(s): GLUCAP in the last 168 hours.  Brief HPI:   [name] is a 82 year old male  with history of HTN, CKD stage IV, GI bleed, COPD, COPD CAD, peripheral neuropathy who was admitted on 08/13/2017 with dizziness, left-sided numbness tingling and  weakness.  He was found to have have acute on chronic renal failure treated with IV fluids for hydration.  MRI of brain done revealing partially thrombosed basilar artery aneurysm diffusion abnormality along the ventral aspect of medulla oblongata.  He developed left-sided weakness on zero 7/31 and repeat MRI brain done revealing evolving acute right medullary infarct without associated hemorrhage and as well as stable large fusiform aneurysm.  Neurology recommended aspirin 325 mg for secondary stroke prevention.  Therapy evaluations done revealing functional decline and CIR was recommended for follow-up therapy   Hospital Course: [name] was admitted to rehab 08/19/2017 for inpatient therapies to consist of PT, ST and OT at least three hours five days a week. Past admission physiatrist, therapy team and rehab RN have worked together to provide customized collaborative inpatient rehab.  He was maintained on aspirin and Plavix during his stay.  Acute on chronic renal failure has been monitored and he was briefly treated with IV fluid due to worsening.  Perianal cyst was treated with Augmentin x10 days as well as moist warm compresses.  Blood pressures have been monitored on twice daily basis and he remains off BP medications at this time.  Chest x-ray was done at admission due to significant wheezing on exam and this was negative for any acute changes.  No cardiac symptoms reported with increase in activity.  He did have worsening of symptoms on 08/07 and repeat MRI/MRA brain done to rule out bleed and showed no change in subacute right medullary infarct and no hemorrhage. Dr. Erlinda Hong felt that patient symptoms were due to TIA versus hypoperfusion likely related to partially thrombosed basilar artery and recommended DAPT x3 months followed by Plavix  alone.   Acute on chronic anemia has been monitored and he was noted to have significant drop down to 7.7.  Subcu Lovenox was discontinued and CT of abdomen pelvis was negative for retroperitoneal bleed.  He was transfused with 2 units packed red blood cells.  GI was consulted for input as patient with heme positive stools however patient declined work-up due to concerns of anesthesia use.   He has not had any issues with hematochezia or maroon stools and GI recommends following up on outpatient basis for work-up if patient willing and/or if indicated.  He continues on DAPT due to high stroke risk.  He has been making progress however continues to vary from min assist to supervision level.  SNF was recommended due to endurance issues as well as family's concerns concerns regarding wife's ability to provide care. Patient has declined this and family education was completed regarding all aspects of care safety, mobility as well as importance of providing assistance to parents.  He  will continue to receive further follow-up home health PT/OT/RN/aide by [facility] care after discharge   Rehab course: During patient's stay in rehab weekly team conferences were held to monitor patient's progress, set goals and discuss barriers to discharge. At admission, patient required max assist with basic self-care task and with mobility.  He exhibited mild cognitive impairments affecting high-level problem-solving awareness of safety as well as mild dysarthria. He  has had improvement in activity tolerance, balance, postural control as well as ability to compensate for deficits. He has had improvement in functional use LUE  and LLE as well as improvement in awareness.  He is able to complete all bathing and dressing tasks at supervision level.  He requires supervision to min guard assist for transfers.  Still requires min assist  for standing.  He is able to ambulate 10 feet x 2 with min guard assist.  Disposition:  Home  Diet: Heart healthy  Special Instructions: 1.  Needs 24 supervision and assistance with all mobility. 2.  Contact primary MD for any episodes of rectal bleeding or hematochezia. 3.  Needs to have repeat CBC and BMET 08/29 to monitor for stability.   Discharge Instructions    Ambulatory referral to Neurology   Complete by:  As directed    Follow up with stroke clinic NP (Jessica Vanschaick or Cecille Rubin, if both not available, consider Zachery Dauer, or A[place]) at [facility] in about 4 weeks. Thanks.     Allergies as of 09/07/2017      Reactions   Ace Inhibitors    Other reaction(s): Other (See Comments) Renal failure.   Isosorbide Nitrate    Other reaction(s): Headache Stomach ache,nausea   Nsaids    Other reaction(s): Unknown   Prednisone Other (See Comments)   Feels like pins/needles/sharp stick on the inside      Medication List    STOP taking these medications   ALPRAZolam 0.5 MG tablet Commonly known as:  XANAX   amoxicillin-clavulanate 500-125 MG tablet Commonly known as:  AUGMENTIN   aspirin 81 MG chewable tablet Replaced by:  aspirin 81 MG EC tablet   metoprolol tartrate 25 MG tablet Commonly known as:  LOPRESSOR   omeprazole 20 MG capsule Commonly known as:  PRILOSEC     TAKE these medications   acetaminophen 325 MG tablet Commonly known as:  TYLENOL Take 1-2 tablets (325-650 mg total) by mouth every 4 (four) hours as needed for mild pain.   ANORO ELLIPTA 62.5-25 MCG/INH Aepb Generic drug:  umeclidinium-vilanterol Inhale 1 puff into the lungs daily.   aspirin 81 MG EC tablet Take 1 tablet (81 mg total) by mouth daily. Replaces:  aspirin 81 MG chewable tablet   atorvastatin 40 MG tablet Commonly known as:  LIPITOR Take 1 tablet (40 mg total) by mouth daily at 6 PM.   calcitRIOL 0.25 MCG capsule Commonly known as:  ROCALTROL Take 1 capsule (0.25 mcg total) by mouth every Monday, Wednesday, and Friday.   citalopram 20 MG tablet Commonly  known as:  CELEXA Take 1.5 tablets (30 mg total) by mouth daily.   clopidogrel 75 MG tablet Commonly known as:  PLAVIX Take 1 tablet (75 mg total) by mouth daily.   gabapentin 100 MG capsule Commonly known as:  NEURONTIN Take 1 capsule (100 mg total) by mouth 2 (two) times daily. What changed:  how much to take   loratadine 10 MG tablet Commonly known as:  CLARITIN Take 1 tablet (10 mg total) by mouth daily.   multivitamin Tabs tablet Take 1 tablet by mouth at bedtime.   MUSCLE RUB 10-15 % Crea Apply 1 application topically 2 (two) times daily as needed for muscle pain.   pantoprazole 40 MG tablet Commonly known as:  PROTONIX Take 1 tablet (40 mg total) by mouth daily.   pantoprazole 40 MG tablet Commonly known as:  PROTONIX Take 1 tablet (40 mg total) by mouth daily.   pantoprazole 40 MG tablet Commonly known as:  PROTONIX Take 1 tablet (40 mg total) by mouth 2 (two) times daily.   polyvinyl alcohol 1.4 % ophthalmic solution Commonly known as:  LIQUIFILM TEARS Place 1 drop into both eyes as needed for dry eyes (Q 6 HRS).   PROAIR HFA 108 (90 Base) MCG/ACT inhaler Generic drug:  albuterol  Inhale 2 puffs into the lungs every 6 (six) hours as needed for wheezing or shortness of breath. What changed:  Another medication with the same name was removed. Continue taking this medication, and follow the directions you see here.   senna-docusate 8.6-50 MG tablet Commonly known as:  Senokot-S Take 2 tablets by mouth at bedtime.   sodium chloride 0.65 % Soln nasal spray Commonly known as:  OCEAN Place 1 spray into both nostrils as needed for congestion.   tamsulosin 0.4 MG Caps capsule Commonly known as:  FLOMAX Take 1 capsule (0.4 mg total) by mouth daily after breakfast.   traMADol 50 MG tablet Commonly known as:  ULTRAM Take 1 tablet (50 mg total) by mouth every 6 (six) hours as needed for moderate pain.   VITRON-C 65-125 MG Tabs Generic drug:  Iron-Vitamin C Take  1 tablet by mouth daily.      Follow-up Information    Kirsteins, Luanna Salk, MD Follow up.   Specialty:  Physical Medicine and Rehabilitation Why:  office will call you for follow up appointment Contact information: [place] [address] [phone]        Rusty Aus, MD Follow up on 09/12/2017.   Specialty:  Internal Medicine Why:  Appointment @ 2:00 PM Contact information: [place] [address] [facility]. Schedule an appointment as soon as possible for a visit in 4 week(s).   Specialty:  Radiology Contact information: [address] [place] [phone]       Mansouraty, Telford Nab., MD Follow up.   Specialties:  Gastroenterology, Internal Medicine Why:  call for follow up Contact information: [place] [address] [phone]           Signed: Bary Leriche 09/09/2017, 5:22 PM

## 2017-09-03 NOTE — Progress Notes (Signed)
Physical Therapy Session Note  Patient Details  Name: TADHG ESKEW MRN: [id number] Date of Birth: 1935/11/26  Today's Date: 09/03/2017 PT Individual Time: 1350-1445 PT Individual Time Calculation (min): 55 min   Short Term Goals: Week 2:  PT Short Term Goal 1 (Week 2): STG=LTG due to ELOS  Skilled Therapeutic Interventions/Progress Updates:    Pt seated in w/c upon PT arrival, agreeable to therapy tx and denies pain. BP at rest 98/66, pt not symptomatic. Pt performed 2 x 10 LAQ and 2 x 10 UE raises, performed sit<>stand and BP now 120/76. Pt propelled w/c to the gym with supervision using B UEs and LEs. Pt ambulated from w/c>mat x 10 ft with min assist and RW. Pt performed blocked practice of stand pivot transfers from mat<>w/c x 6 with min assist/CGA, therapist providing verbal cues for techniques and slow/controlled movements. Pt's wife observing session, discussed pt going home at w/c level vs going to SNF. Therapist provided pts wife with home measurement sheet to determine whether home will be w/c accessible. Therapist emphasized that pt will not be ambulating at home unless it is with home health therapy. Pt performed x 5 and x 8 sit<>stands without UE support, min assist and emphasis on eccentric control. Pt propelled back to room and left with needs in reach and chair alarm set.   Therapy Documentation Precautions:  Precautions Precautions: Fall Restrictions Weight Bearing Restrictions: No   See Function Navigator for Current Functional Status.   Therapy/Group: Individual Therapy  Netta Corrigan, PT, DPT 09/03/2017, 7:50 AM

## 2017-09-03 NOTE — Progress Notes (Signed)
Subjective/Complaints:  No issues overnite except spilled urinal Per pt ortho vitals performed this am but not recorded  ROS:  Denies CP SOB N/V/D  Objective: Vital Signs: Blood pressure 129/77, pulse 95, temperature 98.1 F (36.7 C), resp. rate 17, height 5\' 9"  (1.753 m), weight 93.2 kg, SpO2 96 %. No results found. No results found for this or any previous visit (from the past 72 hour(s)).   HENT: Normocephalic, atraumatic Eyes: EOMI. No discharge. Cardio: RRR. No JVD. Resp: CTA B/L and unlabored GI: BS and ND Skin: Warm and dry. Intact. Neuro: Alert/Oriented Motor: Motor: Left delt 3-/5, bi, tri 2+/5, grip 3+/5  LLE: 4+/5 proximal 2/5 distally Musc/Skel:  No edema or tenderness in extremities, decreased ROM Left shoulder abd (pt states this is chronic from "rotary cuff problem") Gen NAD. Vital signs reviewed   Assessment/Plan: 1. 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 Physiatrist is providing close team supervision and 24 hour management of active medical problems listed below. Physiatrist and rehab team continue to assess barriers to discharge/monitor patient progress toward functional and medical goals. FIM: Function - Bathing Bathing activity did not occur: Refused Position: Shower Body parts bathed by patient: Right arm, Left arm, Chest, Abdomen, Front perineal area, Buttocks, Right upper leg, Left upper leg, Left lower leg, Right lower leg Body parts bathed by helper: Right arm, Buttocks, Back Bathing not applicable: Back Assist Level: Touching or steadying assistance(Pt > 75%)  Function- Upper Body Dressing/Undressing What is the patient wearing?: Pull over shirt/dress Pull over shirt/dress - Perfomed by patient: Put head through opening, Pull shirt over trunk, Thread/unthread right sleeve, Thread/unthread left sleeve Pull over shirt/dress -  Perfomed by helper: Thread/unthread right sleeve, Thread/unthread left sleeve Button up shirt - Perfomed by patient: Thread/unthread right sleeve, Thread/unthread left sleeve, Pull shirt around back Button up shirt - Perfomed by helper: Button/unbutton shirt Assist Level: Supervision or verbal cues Function - Lower Body Dressing/Undressing What is the patient wearing?: Pants, Non-skid slipper socks Position: Wheelchair/chair at sink Pants- Performed by patient: Thread/unthread right pants leg, Thread/unthread left pants leg, Pull pants up/down Pants- Performed by helper: Pull pants up/down Non-skid slipper socks- Performed by patient: Don/doff right sock, Don/doff left sock Non-skid slipper socks- Performed by helper: Don/doff right sock, Don/doff left sock Socks - Performed by patient: Don/doff right sock Socks - Performed by helper: Don/doff right sock, Don/doff left sock Shoes - Performed by patient: Don/doff right shoe, Don/doff left shoe Shoes - Performed by helper: Fasten right, Fasten left TED Hose - Performed by helper: Don/doff right TED hose, Don/doff left TED hose Assist for footwear: Partial/moderate assist Assist for lower body dressing: Touching or steadying assistance (Pt > 75%)  Function - Toileting Toileting activity did not occur: No continent bowel/bladder event Toileting steps completed by patient: Adjust clothing prior to toileting, Performs perineal hygiene Toileting steps completed by helper: Adjust clothing after toileting Toileting Assistive Devices: Grab bar or rail, Toilet aid Assist level: More than reasonable time, Set up/obtain supplies  Function - Air cabin crew transfer assistive device: Elevated toilet seat/BSC over toilet, Walker, Grab bar Assist level to toilet: Touching or steadying assistance (Pt > 75%) Assist level from toilet: Touching or steadying assistance (Pt > 75%)  Function - Chair/bed transfer Chair/bed transfer method: Stand  pivot, Ambulatory Chair/bed transfer assist level: Touching or steadying assistance (Pt > 75%) Chair/bed transfer assistive device: Walker Chair/bed transfer details: Verbal  cues for technique, Verbal cues for precautions/safety, Manual facilitation for placement, Verbal cues for safe use of DME/AE  Function - Locomotion: Wheelchair Will patient use wheelchair at discharge?: (TBD) Type: Manual Max wheelchair distance: 150' Assist Level: Supervision or verbal cues Assist Level: Supervision or verbal cues Wheel 150 feet activity did not occur: Safety/medical concerns Assist Level: Supervision or verbal cues Turns around,maneuvers to table,bed, and toilet,negotiates 3% grade,maneuvers on rugs and over doorsills: No Function - Locomotion: Ambulation Assistive device: Walker-rolling Max distance: 20' Assist level: Moderate assist (Pt 50 - 74%) Assist level: Moderate assist (Pt 50 - 74%) Walk 50 feet with 2 turns activity did not occur: Safety/medical concerns Walk 150 feet activity did not occur: Safety/medical concerns Walk 10 feet on uneven surfaces activity did not occur: Safety/medical concerns  Function - Comprehension Comprehension: Auditory Comprehension assist level: Follows complex conversation/direction with extra time/assistive device  Function - Expression Expression: Verbal Expression assist level: Expresses complex ideas: With extra time/assistive device  Function - Social Interaction Social Interaction assist level: Interacts appropriately 90% of the time - Needs monitoring or encouragement for participation or interaction.  Function - Problem Solving Problem solving assist level: Solves basic problems with no assist  Function - Memory Memory assist level: Recognizes or recalls 90% of the time/requires cueing < 10% of the time Patient normally able to recall (first 3 days only): Current season, Location of own room, Staff names and faces, That he or she is in a  hospital  Medical Problem List and Plan: 1.  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  Team conf in am   2.  DVT Prophylaxis/Anticoagulation: Pharmaceutical: Lovenox 3. Idiopathic neuropathy/Pain Management: Gabapentin was added for LUE dysesthesias. Continue ultram prn as at home.  4. Mood: LCSW to follow for evaluation and support.  5. Neuropsych: This patient is capable of making decisions on his own behalf. 6. Skin/Wound Care: routine pressure relief measures.  7. Fluids/Electrolytes/Nutrition: Monitor I/O. D/C IV enc po in preparation for home d/c 8. COPD: Increased albuterol to every 4 hours prn. Continue Onoro. Encourage IS to help with atelectasis 9.  Acute on chronic renal failure--stage IV:  Baseline SCr-3.65. On calcitriol for secondary hyperparathyroidism. Continue to monitor with serial checks. Avoid ARB/ No ACE due to allergy.   Currently at baseline  Encourage fluid intake           IVF daily at bedtime started on 8/8  10. H/o gastritis due to NSAIDs/ GERD: Continue PPI.  11. Dyslipidemia: on Lipitor.  12. CAD/AS: Treated medically with metoprolol, ASA and lipitor.  13. Peri anal cyst:  With acute on chronic inflammation. No prior treatment per reports. Started on Augmentin on 8/3 for  7-10 days treatment. Continue moist warm compresses to help drain.    14. ABLA on Anemia of chronic disease: Hgb- 11.2-12.5 range. Continue to monitor.   Hemoglobin 10.1 on 8/15 monitor weekly while hospitalized  Stool OB pos,is on Lovenox may need OP f/u with GI, no emergent issues 15. Constipation: Scheduled Senna S 2 at bedtime. 16.  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 Multifocal neuropathy, hx of sciatica, may benefit from outpt EMG to further evaluate 17.  Blood pressure- will have goal of 130-140s- enc fluid hold lopressor for Sys BP <130  Will  stop metoprolol although pt not currently symptomatic- HR a little higher goal is <105bpm Vitals:   09/03/17 0814 09/03/17 0816  BP:    Pulse:    Resp:    Temp:    SpO2: 96% 96%   18. Urinary frequency  I: 591ml    PVRs ordered  Condom cath qhs until PVRs/IVF 19.  Chronic left shoulder contrature, incomplete abd pt states he wore some type of brace ? Cuff sling will discuss with OT LOS (Days) 15 A FACE TO [place] Lacroix 09/03/2017, 8:44 AM

## 2017-09-03 NOTE — Progress Notes (Signed)
Occupational Therapy Session Note  Patient Details  Name: [name] MRN: [id number] Date of Birth: 1935-07-05  Today's Date: 09/03/2017 OT Individual Time: 0700-0800 OT Individual Time Calculation (min): 60 min    Short Term Goals: Week 2:  OT Short Term Goal 1 (Week 2): STG=LTG secondary to ELOS  Skilled Therapeutic Interventions/Progress Updates:    OT intervention with focus on bed mobility, functional transfers, toileting, BADL retraining, activity tolerance, and safety awareness to increase independence with BADLs. Pt sat EOB with supervision and performed stand pivot transfer with RW to w/c with steady A.  Pt requested to use toilet and performed transfers with grab bars at min A.  Pt required steady A for toileting tasks.  Pt completed bathing/dressing tasks with sit<>stand from w/c at sink with min A/steady A. Pt fatigues quickly and requires multiple rest breaks during session.  Pt remained seated in w/c with breakfast tray and all needs within each.  Belt alarm activated.   Therapy Documentation Precautions:  Precautions Precautions: Fall Restrictions Weight Bearing Restrictions: No Pain:  Pt denies pain  See Function Navigator for Current Functional Status.   Therapy/Group: Individual Therapy  Leroy Libman 09/03/2017, 8:57 AM

## 2017-09-03 NOTE — Progress Notes (Signed)
Social Work Patient ID: [name], male   DOB: 13-Dec-1935, 82 y.o.   MRN: [id number]  Wife and granddaughter here to see pt in therapies and saw him in PT. Wife voiced concerns about going home, pt wants to go home and thinks they can work it out. Pt would need to be wheelchair level at home. Wife is not able to assist pt.Given a measurement sheet and they will measure doorways and step height. Wife does not want him to fall at home. Discussed this could happen anywhere. See tomorrow when here for therapies.

## 2017-09-03 NOTE — Progress Notes (Signed)
Respiratory protocol complete.. Patient has been requiring PRN treatments 1-2 times a day and requests them at times and needs them more often due to SOB.  Changed to scheduled txs.

## 2017-09-03 NOTE — Progress Notes (Signed)
Physical Therapy Session Note  Patient Details  Name: [name] MRN: [id number] Date of Birth: [birth year]  Today's Date: 09/03/2017 PT Individual Time: 0915-1000 PT Individual Time Calculation (min): 45 min   Short Term Goals: Week 2:  PT Short Term Goal 1 (Week 2): STG=LTG due to ELOS  Skilled Therapeutic Interventions/Progress Updates:   Pt in supine and agreeable to therapy, denies pain. Transferred to EOB w/ supervision and to w/c via stand pivot, min assist. BP 97/69 after transfer, asymptomatic. TED hose and abdominal binder donned. Total assist w/c transport to/from therapy gym for time management and for rest breaks. Session focused on blocked practice of stand pivot transfers w/ RW, min guard overall w/ verbal cues for RW management and technique. Performed 5+ reps in both directions, emphasized slow and controlled movements for safety. BP systolic 643-142 taken intermittently between transfers, remained asymptomatic throughout session. Returned to room and ended session in recliner, call bell within reach.   Therapy Documentation Precautions:  Precautions Precautions: Fall Restrictions Weight Bearing Restrictions: No Vital Signs: Therapy Vitals Pulse Rate: 93 Resp: 18 BP: 125/79 Patient Position (if appropriate): Sitting Oxygen Therapy SpO2: 96 % O2 Device: Room Air  See Function Navigator for Current Functional Status.   Therapy/Group: Individual Therapy  [name] 09/03/2017, 10:45 AM

## 2017-09-04 ENCOUNTER — Inpatient Hospital Stay (HOSPITAL_COMMUNITY): Payer: Medicare Other

## 2017-09-04 ENCOUNTER — Inpatient Hospital Stay (HOSPITAL_COMMUNITY): Payer: Self-pay

## 2017-09-04 LAB — BASIC METABOLIC PANEL
Anion gap: 6 (ref 5–15)
BUN: 68 mg/dL — AB (ref 8–23)
CALCIUM: 8.4 mg/dL — AB (ref 8.9–10.3)
CO2: 20 mmol/L — ABNORMAL LOW (ref 22–32)
Chloride: 119 mmol/L — ABNORMAL HIGH (ref 98–111)
Creatinine, Ser: 3.85 mg/dL — ABNORMAL HIGH (ref 0.61–1.24)
GFR calc Af Amer: 16 mL/min — ABNORMAL LOW (ref >60)
GFR, EST NON AFRICAN AMERICAN: 14 mL/min — AB (ref >60)
GLUCOSE: 100 mg/dL — AB (ref 70–99)
POTASSIUM: 4.5 mmol/L (ref 3.5–5.1)
Sodium: 145 mmol/L (ref 135–145)

## 2017-09-04 NOTE — Progress Notes (Signed)
Physical Therapy Session Note  Patient Details  Name: [name] MRN: [id number] Date of Birth: 08/25/1935  Today's Date: 09/04/2017 PT Individual Time:1130-1200 and 1300-1400 PT Individual Time Calculation (min): 30 min and 60 min   Short Term Goals: Week 2:  PT Short Term Goal 1 (Week 2): STG=LTG due to ELOS  Skilled Therapeutic Interventions/Progress Updates:    Session 1: Pt seated in w/c upon PT arrival, agreeable to therapy tx and denies pain. Pt becoming very tearful and states "I just don't want to go to a nursing home, I would rather go home and die." Therapist provided emotional support and emphasized that if he were to go home it would be at a w/c level with no ambulation. Pt propelled w/c from room>rehab apartment with supervision. Pt performed blocked practice of stand pivot transfers from w/c<>bed with min guard assist and emphasis on proper techniques/safety. Pt performed car transfer with RW and min assist, verbal cues for techniques and safety. Pt left seated in w/c in room at end of session with needs in reach.   Session 2: Pt seated in w/c upon PT arrival, agreeable to therapy tx and denies pain. Pt's granddaughter and wife present for family education this session. Pt propelled w/c from room>rehab apartment with supervision. Pt performed stand pivot transfer from w/c<>bed with supervision and RW, verbal cues for techniques and safety. Therapist educated family on techniques and proper cues to provide. Pt propelled to the ortho gym with supervision. Pt performed car transfer with RW and min assist, stand pivot. Therapist educating family on techniques and safety with car transfer. Pt propelled w/c to the gym x 100 ft with supervision. Pt ascended/descended 2 steps with B UE support on L handrail and min assist, performed x 1 with assist from therapist and x 1 with assist from granddaughter. Pt ambulated x 10 ft with RW and min assist from therapist. Pt transported back to room and  left with needs in reach, chair alarm set.   Therapy Documentation Precautions:  Precautions Precautions: Fall Restrictions Weight Bearing Restrictions: No   See Function Navigator for Current Functional Status.   Therapy/Group: Individual Therapy  Netta Corrigan , PT, DPT 09/04/2017, 7:56 AM

## 2017-09-04 NOTE — Progress Notes (Signed)
Occupational Therapy Session Note  Patient Details  Name: [name] MRN: [id number] Date of Birth: 05-25-1935  Today's Date: 09/04/2017 OT Individual Time: 1400-1430 OT Individual Time Calculation (min): 30 min    Short Term Goals: Week 2:  OT Short Term Goal 1 (Week 2): STG=LTG secondary to ELOS  Skilled Therapeutic Interventions/Progress Updates:    Pt resting in w/c upon arrival with wife present.  OT intervention with focus on toilet transfers and continued discharge planning and DME requirements.  Pt unable to purchase tub transfer bench at present.  Provided names of DME stores in [place]. Pt performed toilet transfers with close supervision.   Therapy Documentation Precautions:  Precautions Precautions: Fall Restrictions Weight Bearing Restrictions: No Pain: Pain Assessment Pain Scale: 0-10 Pain Score: 0-No pain  See Function Navigator for Current Functional Status.   Therapy/Group: Individual Therapy  Leroy Libman 09/04/2017, 2:53 PM

## 2017-09-04 NOTE — Progress Notes (Signed)
Subjective/Complaints:  No issues overnite, pt states he is drinking well.   Discussed home vs SNF.  Discussed how much assistance pt will require and inability to ambulate safely. WC bound for safety.  Pt refusing SNF- "I'd rather die at home"  ROS:  Denies CP SOB N/V/D  Objective: Vital Signs: Blood pressure 108/70, pulse 68, temperature 98.6 F (37 C), temperature source Oral, resp. rate 17, height '5\' 9"'  (1.753 m), weight 93.2 kg, SpO2 99 %. No results found. Results for orders placed or performed during the hospital encounter of 08/19/17 (from the past 72 hour(s))  Basic metabolic panel     Status: Abnormal   Collection Time: 09/04/17  5:59 AM  Result Value Ref Range   Sodium 145 135 - 145 mmol/L   Potassium 4.5 3.5 - 5.1 mmol/L   Chloride 119 (H) 98 - 111 mmol/L   CO2 20 (L) 22 - 32 mmol/L   Glucose, Bld 100 (H) 70 - 99 mg/dL   BUN 68 (H) 8 - 23 mg/dL   Creatinine, Ser 3.85 (H) 0.61 - 1.24 mg/dL   Calcium 8.4 (L) 8.9 - 10.3 mg/dL   GFR calc non Af Amer 14 (L) >60 mL/min   GFR calc Af Amer 16 (L) >60 mL/min    Comment: (NOTE) The eGFR has been calculated using the CKD EPI equation. This calculation has not been validated in all clinical situations. eGFR's persistently <60 mL/min signify possible Chronic Kidney Disease.    Anion gap 6 5 - 15    Comment: Performed at [facility] [address], [place], [place] [address]     HENT: Normocephalic, atraumatic Eyes: EOMI. No discharge. Cardio: RRR. No JVD. Resp: CTA B/L and unlabored GI: BS and ND Skin: Warm and dry. Intact. Neuro: Alert/Oriented Motor: Motor: Left delt 3-/5, bi, tri 2+/5, grip 3+/5  LLE: 4+/5 proximal 2/5 distally Musc/Skel:  No edema or tenderness in extremities, decreased ROM Left shoulder abd (pt states this is chronic from "rotary cuff problem") Gen NAD. Vital signs reviewed   Assessment/Plan: 1. 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 Physiatrist is providing close team supervision and 24 hour management of active medical problems listed below. Physiatrist and rehab team continue to assess barriers to discharge/monitor patient progress toward functional and medical goals. FIM: Function - Bathing Bathing activity did not occur: Refused Position: Wheelchair/chair at sink Body parts bathed by patient: Right arm, Left arm, Chest, Abdomen, Front perineal area, Right upper leg, Buttocks, Left upper leg, Right lower leg, Left lower leg Body parts bathed by helper: Right arm, Buttocks, Back Bathing not applicable: Back Assist Level: Touching or steadying assistance(Pt > 75%)  Function- Upper Body Dressing/Undressing What is the patient wearing?: Pull over shirt/dress Pull over shirt/dress - Perfomed by patient: Put head through opening, Pull shirt over trunk, Thread/unthread right sleeve, Thread/unthread left sleeve Pull over shirt/dress - Perfomed by helper: Thread/unthread right sleeve, Thread/unthread left sleeve Button up shirt - Perfomed by patient: Thread/unthread right sleeve, Thread/unthread left sleeve, Pull shirt around back Button up shirt - Perfomed by helper: Button/unbutton shirt Assist Level: Supervision or verbal cues Function - Lower Body Dressing/Undressing What is the patient wearing?: Underwear, Pants, Non-skid slipper socks, Ted Hose Position: Wheelchair/chair at [employer] - Performed by patient: Thread/unthread right underwear leg, Thread/unthread left underwear leg, Pull underwear up/down Pants- Performed by patient: Thread/unthread right pants leg, Thread/unthread left  pants leg, Pull pants up/down Pants- Performed by helper: Pull pants up/down Non-skid slipper socks- Performed by patient: Don/doff right sock, Don/doff left sock Non-skid slipper socks- Performed by helper: Don/doff right sock, Don/doff left sock Socks - Performed  by patient: Don/doff right sock Socks - Performed by helper: Don/doff right sock, Don/doff left sock Shoes - Performed by patient: Don/doff right shoe, Don/doff left shoe Shoes - Performed by helper: Fasten right, Fasten left TED Hose - Performed by helper: Don/doff right TED hose, Don/doff left TED hose Assist for footwear: Partial/moderate assist Assist for lower body dressing: Touching or steadying assistance (Pt > 75%)  Function - Toileting Toileting activity did not occur: No continent bowel/bladder event Toileting steps completed by patient: Adjust clothing prior to toileting, Performs perineal hygiene, Adjust clothing after toileting Toileting steps completed by helper: Adjust clothing after toileting Toileting Assistive Devices: Grab bar or rail, Toilet aid Assist level: Touching or steadying assistance (Pt.75%)  Function - Air cabin crew transfer assistive device: Elevated toilet seat/BSC over toilet, Walker, Grab bar Assist level to toilet: Touching or steadying assistance (Pt > 75%) Assist level from toilet: Touching or steadying assistance (Pt > 75%)  Function - Chair/bed transfer Chair/bed transfer method: Stand pivot Chair/bed transfer assist level: Touching or steadying assistance (Pt > 75%) Chair/bed transfer assistive device: Armrests, Walker Chair/bed transfer details: Verbal cues for safe use of DME/AE, Verbal cues for precautions/safety, Verbal cues for technique  Function - Locomotion: Wheelchair Will patient use wheelchair at discharge?: (TBD) Type: Manual Max wheelchair distance: 150' Assist Level: Supervision or verbal cues Assist Level: Supervision or verbal cues Wheel 150 feet activity did not occur: Safety/medical concerns Assist Level: Supervision or verbal cues Turns around,maneuvers to table,bed, and toilet,negotiates 3% grade,maneuvers on rugs and over doorsills: No Function - Locomotion: Ambulation Assistive device: Walker-rolling Max  distance: 10 ft Assist level: Touching or steadying assistance (Pt > 75%) Assist level: Touching or steadying assistance (Pt > 75%) Walk 50 feet with 2 turns activity did not occur: Safety/medical concerns Walk 150 feet activity did not occur: Safety/medical concerns Walk 10 feet on uneven surfaces activity did not occur: Safety/medical concerns  Function - Comprehension Comprehension: Auditory Comprehension assist level: Follows complex conversation/direction with extra time/assistive device  Function - Expression Expression: Verbal Expression assist level: Expresses complex ideas: With extra time/assistive device  Function - Social Interaction Social Interaction assist level: Interacts appropriately 90% of the time - Needs monitoring or encouragement for participation or interaction.  Function - Problem Solving Problem solving assist level: Solves basic problems with no assist  Function - Memory Memory assist level: Recognizes or recalls 90% of the time/requires cueing < 10% of the time Patient normally able to recall (first 3 days only): Current season, Location of own room, Staff names and faces, That he or she is in a hospital  Medical Problem List and Plan: 1.  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  Team conference today please see physician documentation under team conference tab, met with team face-to-face to discuss problems,progress, and goals. Formulized individual treatment plan based on medical history, underlying problem and comorbidities.  2.  DVT Prophylaxis/Anticoagulation: Pharmaceutical: Lovenox 3. Idiopathic neuropathy/Pain Management: Gabapentin was added for LUE dysesthesias. Continue ultram prn as at home.  4. Mood: LCSW to follow for evaluation and support.  5. Neuropsych: This patient is  capable of making  decisions on his own behalf. 6. Skin/Wound Care: routine pressure relief measures.  7. Fluids/Electrolytes/Nutrition: Monitor I/O. D/C IV enc po in preparation for home d/c 8. COPD: Increased albuterol to every 4 hours prn. Continue Onoro. Encourage IS to help with atelectasis 9.  Acute on chronic renal failure--stage IV:  Baseline SCr-3.65. On calcitriol for secondary hyperparathyroidism. GFR stable, BUN a little up. Avoid ARB/ No ACE due to allergy.   Currently at baseline  Encourage fluid intake- 936m yesterday           IVF daily at bedtime started on 8/8  10. H/o gastritis due to NSAIDs/ GERD: Continue PPI.  11. Dyslipidemia: on Lipitor.  12. CAD/AS: Treated medically with metoprolol, ASA and lipitor.  13. Peri anal cyst:  With acute on chronic inflammation. No prior treatment per reports. Started on Augmentin on 8/3 for  7-10 days treatment. Continue moist warm compresses to help drain.    14. ABLA on Anemia of chronic disease: Hgb- 11.2-12.5 range. Continue to monitor.   Hemoglobin 10.1 on 8/15 monitor weekly while hospitalized  Stool OB pos,is on Lovenox may need OP f/u with GI, no emergent issues 15. Constipation: Scheduled Senna S 2 at bedtime. 16.  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 Multifocal neuropathy, hx of sciatica, may benefit from outpt EMG to further evaluate 17. Blood pressure- off antihypertensive meds Vitals:   09/04/17 0826 09/04/17 0827  BP:    Pulse:    Resp:    Temp:    SpO2: 98% 99%   18. Urinary frequency  I: 5626m   PVRs ordered  Condom cath qhs until PVRs/IVF 19.  Chronic left shoulder contrature, incomplete abd pt states he wore some type of brace ? Cuff sling will discuss with OT  LOS (Days) 16 A FACE TO FACE EVALUATION WAS PERFORMED  AnCharlett Blake/21/2019, 10:02 AM

## 2017-09-04 NOTE — Patient Care Conference (Signed)
Inpatient RehabilitationTeam Conference and Plan of Care Update Date: 09/04/2017   Time: 11:20 AM    Patient Name: [name]      Medical Record Number: [id number]  Date of Birth: 02-Dec-1935 Sex: Male         Room/Bed: 4W20C/4W20C-01 Payor Info: Payor: [employer] / Plan: [employer] MEDICARE / Product Type: *No Product type* /    Admitting Diagnosis: Rt CVA  Admit Date/Time:  08/19/2017  2:58 PM Admission Comments: No comment available   Primary Diagnosis:  <principal problem not specified> Principal Problem: <principal problem not specified>  Patient Active Problem List   Diagnosis Date Noted  . Wheezing   . Left foot drop   . Anemia of chronic disease   . Acute blood loss anemia   . Urinary frequency   . 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 Acute brainstem infarction ([place])   . Generalized OA   . Gastritis and gastroduodenitis   . History of GI bleed   . Chronic obstructive pulmonary disease ([place])   . AKI (acute kidney injury) ([place])   . Stage 3 chronic kidney disease ([place])   . Perianal abscess   . CVA (cerebral vascular accident) ([place]) 08/14/2017  . Renal failure (ARF), acute on chronic ([facility]) 08/13/2017  . Thoracic aortic aneurysm without rupture ([place]) 12/04/2016  . Essential hypertension 12/04/2016    Expected Discharge Date: Expected Discharge Date: 09/07/17  Team Members Present: Physician leading conference: Dr. Alysia Penna Social Worker Present: Ovidio Kin, LCSW Nurse Present: Junius Creamer, RN PT Present: Michaelene Song, PT OT Present: Willeen Cass, OT;Roanna Epley, COTA SLP Present: Weston Anna, SLP PPS Coordinator present : Daiva Nakayama, RN, CRRN     Current Status/Progress Goal Weekly Team Focus  Medical   Blood pressure normalized no week episodes, chronic kidney disease  Maintain adequate cerebral perfusion, reduce fall risk  Discharge planning, and determine family's ability to care for patient    Bowel/Bladder   Continent of bowel and bladder; LBM 09/02/2017  Remain continent of bowel and bladder  Assess bowel and bladder function q shift and PRN   Swallow/Nutrition/ Hydration             ADL's   functional transfers-min A; bathing-supervision; dressing-supervision; limited activity tolerance  supervision overall, CGA - shower transfer  functional transfers, standing balance, education, activity tolerance, discharge planning   Mobility   min assist to CGA transfers, mod-max assist short distances gait w/ RW  supervision to min assist household gait, will likely keep d/c recommendations at w/c level for safety w/ BP issues  discharge planning, family education, w/c level mobility, positioning for BP management   Communication             Safety/Cognition/ Behavioral Observations            Pain   No complaints of pain at this time  Pain </= 2/10  Assess pain q shift and PRN and treat as needed   Skin   MASD to groin area treated with barrier cream, boil/small abcess on buttocks treated with warm compress and Hibiclens  Remain free of infection and breakdown  Assess skin q shift and PRN      *See Care Plan and progress notes for long and short-term goals.     Barriers to Discharge  Current Status/Progress Possible Resolutions Date Resolved   Physician          Slow progress towards goals  SNF versus  home with wife, will need follow-up with nephrology      Nursing                  PT  Decreased caregiver support;Medical stability;Inaccessible home environment  BP fluctuates, affecting safety. Wife unable to provide more than supervision level assist. 2 steps to enter home              OT                  SLP                SW                Discharge Planning/Teaching Needs:    Home versus NHP. Pt and wife to decide today when she is here for more PT. She is not able to assist him but pt wants to go home no matter what.     Team Discussion:  Pt continues to be  supervision-min with transfers, now wheelchair level at discharge and not safe to ambulate with family only therapies. MD feels medically stable renal function more stable and hydrating self. Fatigues quickly and this is concerning for home and moving around. Will need to decide today option home versus NH and address stair issue.  Revisions to Treatment Plan:  DC 8/24-home versus NH    Continued Need for Acute Rehabilitation Level of Care: The patient requires daily medical management by a physician with specialized training in physical medicine and rehabilitation for the following conditions: Daily direction of a multidisciplinary physical rehabilitation program to ensure safe treatment while eliciting the highest outcome that is of practical value to the patient.: Yes Daily medical management of patient stability for increased activity during participation in an intensive rehabilitation regime.: Yes Daily analysis of laboratory values and/or radiology reports with any subsequent need for medication adjustment of medical intervention for : Neurological problems;Blood pressure problems  Guillaume Weninger, Gardiner Rhyme 09/04/2017, 4:16 PM

## 2017-09-04 NOTE — Progress Notes (Signed)
Occupational Therapy Session Note  Patient Details  Name: MONIQUE GIFT MRN: [id number] Date of Birth: January 03, 1936  Today's Date: 09/04/2017 OT Individual Time: 0700-0800 OT Individual Time Calculation (min): 60 min    Short Term Goals: Week 2:  OT Short Term Goal 1 (Week 2): STG=LTG secondary to ELOS  Skilled Therapeutic Interventions/Progress Updates:    OT intervention with focus on self feeding, functional transfers, bathing at shower level and dressing with sit<>stand from w/c.  Pt requires steady A for tub bench transfers into walk-in shower. Pt completed bathing tasks with supervision using lateral leans to bathe buttocks.  Pt required steady A for LB dressing tasks when standing to pull pants over hips. Pt fatigues quickly with SOB and requires multiple rest breaks during bathing/dressing tasks. Pt remained in w/c with bel alarm activated and all needs within reach.   Therapy Documentation Precautions:  Precautions Precautions: Fall Restrictions Weight Bearing Restrictions: No   Pain:  Pt denies pain  See Function Navigator for Current Functional Status.   Therapy/Group: Individual Therapy  Leroy Libman 09/04/2017, 10:50 AM

## 2017-09-04 NOTE — Progress Notes (Signed)
Social Work Patient ID: [name], male   DOB: 1935-04-19, 82 y.o.   MRN: [id number]  Met with pt, wife and granddaughter who were here for PT session. Has sen pt in therapies and watched him transfer and move around. Pt plans on going home at discharge. Pt states: " I would rather die than go to a nursing home." Wife states: " We can try it at home." She will not be able to provide any care to him. Granddaughter has been checked off on getting into and out of a car along with up and down steps. Pt will need someone to do this with him and not his wife. Discussed the safety concerns and risk of falling at home if he doesn't slow down and take his time. Aware team wants to extend to Sat 8/24 discharge and work until then. Plan is for pt to go home Sat. Will make referral to [facility] care due to their daughter receives services from them and they know the PT there. Will also order equipment and work toward discharge.

## 2017-09-05 ENCOUNTER — Encounter (HOSPITAL_COMMUNITY): Payer: Self-pay | Admitting: Anesthesiology

## 2017-09-05 ENCOUNTER — Inpatient Hospital Stay (HOSPITAL_COMMUNITY): Payer: Medicare Other | Admitting: Physical Therapy

## 2017-09-05 ENCOUNTER — Inpatient Hospital Stay (HOSPITAL_COMMUNITY): Payer: Medicare Other

## 2017-09-05 DIAGNOSIS — D649 Anemia, unspecified: Secondary | ICD-10-CM

## 2017-09-05 DIAGNOSIS — K219 Gastro-esophageal reflux disease without esophagitis: Secondary | ICD-10-CM

## 2017-09-05 LAB — CBC
HCT: 26.2 % — ABNORMAL LOW (ref 39.0–52.0)
HEMATOCRIT: 25.1 % — AB (ref 39.0–52.0)
Hemoglobin: 7.7 g/dL — ABNORMAL LOW (ref 13.0–17.0)
Hemoglobin: 7.9 g/dL — ABNORMAL LOW (ref 13.0–17.0)
MCH: 27.8 pg (ref 26.0–34.0)
MCH: 27.3 pg (ref 26.0–34.0)
MCHC: 30.7 g/dL (ref 30.0–36.0)
MCHC: 30.2 g/dL (ref 30.0–36.0)
MCV: 90.6 fL (ref 78.0–100.0)
MCV: 90.7 fL (ref 78.0–100.0)
PLATELETS: 204 10*3/uL (ref 150–400)
Platelets: 219 10*3/uL (ref 150–400)
RBC: 2.77 MIL/uL — ABNORMAL LOW (ref 4.22–5.81)
RBC: 2.89 MIL/uL — AB (ref 4.22–5.81)
RDW: 16.4 % — AB (ref 11.5–15.5)
RDW: 16.6 % — ABNORMAL HIGH (ref 11.5–15.5)
WBC: 5.9 10*3/uL (ref 4.0–10.5)
WBC: 6.5 10*3/uL (ref 4.0–10.5)

## 2017-09-05 MED ORDER — SALINE SPRAY 0.65 % NA SOLN: 1.0000 | NASAL | 0 refills | Status: AC | PRN

## 2017-09-05 MED ORDER — RENA-VITE PO TABS: 1.0000 | ORAL_TABLET | Freq: Every day | ORAL | 0 refills | Status: DC

## 2017-09-05 MED ORDER — ACETAMINOPHEN 325 MG PO TABS: 325.0000 mg | ORAL_TABLET | ORAL | Status: AC | PRN

## 2017-09-05 MED ORDER — MUSCLE RUB 10-15 % EX CREA
1.0000 | TOPICAL_CREAM | Freq: Two times a day (BID) | CUTANEOUS | 0 refills | Status: DC | PRN
Start: 2017-09-05 — End: 2017-12-04

## 2017-09-05 MED ORDER — POLYVINYL ALCOHOL 1.4 % OP SOLN: 1.0000 [drp] | OPHTHALMIC | 0 refills | Status: AC | PRN

## 2017-09-05 MED ORDER — LORATADINE 10 MG PO TABS: 10.0000 mg | ORAL_TABLET | Freq: Every day | ORAL | Status: DC

## 2017-09-05 NOTE — Progress Notes (Signed)
Social Work Patient ID: [name], male   DOB: 12/06/1935, 82 y.o.   MRN: [id number]  Met with pt, wife and granddaughter who were here again for PT session they are feeling better about going home Sat. Pt is doing better with his transfers and taking a few steps. Granddaughter or son in-law will be the one to get him up the steps at home-upon discharge. Plan for discharge on Sat.  

## 2017-09-05 NOTE — Consult Note (Signed)
[facility] Consult: 1:06 PM 09/05/2017  LOS: 17 days    Referring Provider: Dr  Letta Pate  Primary Care Physician:  Rusty Aus, MD Primary Gastroenterologist:   Althia Forts. Cardiologist: Mosetta Pigeon MD in [facility]   Reason for Consultation: Acute on chronic anemia.   HPI: [name] is a 82 y.o. male.  PMH CKD stage 4.   COPD,severe.  CAD, not a surgical candidate in 2017 due to lung disease and overall poor health..  HTN.  Peripheral neuropathy. No prior colonoscopy or EGD.   Patient admitted to [facility] 08/13/2017 -08/19/2017 with acute stroke, left-sided weakness.  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  Seen by nephrology for stage 4 CKD.  He received Augmentin and dressing changes for a draining perianal abscess. Problems post stroke include dizziness and balance problems affecting his mobility. On Plavix, 325 mg ASA, SQ Lovenox since the stroke.  Patient's Hgb has drifted.   Since 7/30 hemoglobins gone from 12.5.. 11.2.. 10.4.. 10.1 on 8/15 .Marland Kitchen 7.7 and 7.9 today 8/22. CV 90.  Platelets in the 200s. Stool FOB + on 8/9 and 8/10 Continues with stage 4-5 CKD.  BUN and creatinine have been steadily rising over the course of the hospitalization.  GI for single episode of painless, large-volume hematochezia 01/2011.  Hgb then was 7.5 with MCV of 71. GI consult obtained and they suggested EGD and colonoscopy but he never pursued this.  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  Colon diverticulois.   Liver normal.    The only interval Hgb between 2013 now is Hgb 12.2, MCV 80 in 09/2013.  2 or 3 years ago transfused with a single  unit of blood for anemia, this was not associated with GI bleeding.  At home he is on an iron/vitamin C supplements which include 65 mg of iron once daily.  Has never been been treated with erythropoietin analog products.  Patient normally has brown, formed stool every day.  Within the last week his stools were loose, dark blood in the last 3 days they have turned formed and more brown in color.  Has chronic, intermittent boils erupting on his buttocks.  Has never seen blood in his stool.  Denies nausea, abdominal pain, dysphagia.  Occasionally has heartburn if he eats and lays down to go to bed right away, he treats successfully with Mylanta.  No dysphagia.  Weight fluctuates but has not had significant weight loss lately.  Does not drink alcohol or use NSAIDs.  Home med list includes omeprazole 20 mg BID, 81 ASA, Vitron-C.    Family history negative for ulcers, anemia, colon cancer, colitis. Patient does not drink alcohol.    Past Medical History:  Diagnosis Date  . Aortic stenosis   . CAD (coronary artery disease)   . CKD (chronic kidney disease) stage 4, GFR 15-29 ml/min (HCC)   . Hereditary and idiopathic peripheral neuropathy   .  HTN (hypertension)   . Pulmonary nodules     Past Surgical History:  Procedure Laterality Date  . CARDIAC CATHETERIZATION N/A 08/04/2015   Procedure: Right/Left Heart Cath and Coronary Angiography;  Surgeon: Yolonda Kida, MD;  Location: [facility];  Service: Cardiovascular;  Laterality: N/A;    Prior to Admission medications   Medication Sig Start Date End Date Taking? Authorizing Provider  acetaminophen (TYLENOL) 325 MG tablet Take 1-2 tablets (325-650 mg total) by mouth every 4 (four) hours as needed for mild pain. 09/05/17   Love, Ivan Anchors, PA-C  albuterol (PROVENTIL) (2.5 MG/3ML) 0.083% nebulizer solution Inhale 3 mLs into the lungs every 6 (six) hours as needed for wheezing or shortness of breath. 08/19/17   Gladstone Lighter, MD    ALPRAZolam Duanne Moron) 0.5 MG tablet Take 1 tablet (0.5 mg total) by mouth 2 (two) times daily as needed for anxiety. 08/19/17   Gladstone Lighter, MD  ANORO ELLIPTA 62.5-25 MCG/INH AEPB Inhale 1 puff into the lungs daily.  12/01/16   [provider]  aspirin 81 MG chewable tablet Chew 1 tablet (81 mg total) by mouth daily. 08/20/17   Gladstone Lighter, MD  atorvastatin (LIPITOR) 40 MG tablet Take 1 tablet (40 mg total) by mouth daily at 6 PM. 08/19/17   Gladstone Lighter, MD  calcitRIOL (ROCALTROL) 0.25 MCG capsule Take 1 capsule (0.25 mcg total) by mouth every Monday, Wednesday, and Friday. 08/21/17   Gladstone Lighter, MD  citalopram (CELEXA) 20 MG tablet Take 30 mg by mouth daily.  07/29/15   [provider]  gabapentin (NEURONTIN) 100 MG capsule Take 2 capsules (200 mg total) by mouth 2 (two) times daily. 08/19/17   Gladstone Lighter, MD  Iron-Vitamin C (VITRON-C) 65-125 MG TABS Take 1 tablet by mouth daily.    [provider]  loratadine (CLARITIN) 10 MG tablet Take 1 tablet (10 mg total) by mouth daily. 09/06/17   Love, Ivan Anchors, PA-C  Menthol-Methyl Salicylate (MUSCLE RUB) 10-15 % CREA Apply 1 application topically 2 (two) times daily as needed for muscle pain. 09/05/17   Love, Ivan Anchors, PA-C  metoprolol tartrate (LOPRESSOR) 25 MG tablet Take 25 mg by mouth 2 (two) times daily.  06/27/15   [provider]  multivitamin (RENA-VIT) TABS tablet Take 1 tablet by mouth at bedtime. 09/05/17   Love, Ivan Anchors, PA-C  omeprazole (PRILOSEC) 20 MG capsule Take 20 mg by mouth 2 (two) times daily. 07/29/15   [provider]  polyvinyl alcohol (LIQUIFILM TEARS) 1.4 % ophthalmic solution Place 1 drop into both eyes as needed for dry eyes (Q 6 HRS). 09/05/17   Love, Ivan Anchors, PA-C  PROAIR HFA 108 [phone] Base) MCG/ACT inhaler Inhale 2 puffs into the lungs every 6 (six) hours as needed for wheezing or shortness of breath.  06/13/15   [provider]  sodium chloride (OCEAN)  0.65 % SOLN nasal spray Place 1 spray into both nostrils as needed for congestion. 09/05/17   Love, Ivan Anchors, PA-C  tamsulosin (FLOMAX) 0.4 MG CAPS capsule Take 0.4 mg by mouth daily after breakfast.  10/25/16 10/25/17  [provider]  traMADol (ULTRAM) 50 MG tablet Take 1 tablet (50 mg total) by mouth every 6 (six) hours as needed for moderate pain. 08/19/17   Gladstone Lighter, MD    Scheduled Meds: . albuterol  3 mL Inhalation BID BM & HS  . aspirin EC  81 mg Oral Daily   And  . clopidogrel  75 mg  Oral Daily  . atorvastatin  40 mg Oral q1800  . calcitRIOL  0.25 mcg Oral Q M,W,F  . chlorhexidine   Topical Daily  . citalopram  30 mg Oral Daily  . feeding supplement (PRO-STAT SUGAR FREE 64)  30 mL Oral BID  . gabapentin  100 mg Oral BID  . loratadine  10 mg Oral Daily  . multivitamin  1 tablet Oral QHS  . pantoprazole  40 mg Oral QAC breakfast  . senna-docusate  2 tablet Oral QHS  . tamsulosin  0.4 mg Oral QPC supper  . umeclidinium-vilanterol  1 puff Inhalation Daily   Infusions:  PRN Meds: acetaminophen, alum & mag hydroxide-simeth, bisacodyl, diphenhydrAMINE, guaiFENesin-dextromethorphan, MUSCLE RUB, polyethylene glycol, polyvinyl alcohol, prochlorperazine **OR** prochlorperazine **OR** prochlorperazine, sodium chloride, traMADol, traZODone   Allergies as of 08/19/2017 - Review Complete 08/19/2017  Allergen Reaction Noted  . Ace inhibitors  08/12/2013  . Isosorbide nitrate  09/06/2015  . Nsaids  08/12/2013  . Prednisone Other (See Comments) 08/02/2015    Family History  Problem Relation Age of Onset  . Hypertension Mother   . Heart attack Father     Social History   Socioeconomic History  . Marital status: Married    Spouse name: Not on file  . Number of children: Not on file  . Years of education: Not on file  . Highest education level: Not on file  Occupational History  . Not on file  Social Needs  . Financial resource strain: Not on file  . Food  insecurity:    Worry: Not on file    Inability: Not on file  . Transportation needs:    Medical: Not on file    Non-medical: Not on file  Tobacco Use  . Smoking status: Never Smoker  . Smokeless tobacco: Never Used  Substance and Sexual Activity  . Alcohol use: No    Frequency: Never  . Drug use: Never  . Sexual activity: Not on file  Lifestyle  . Physical activity:    Days per week: Not on file    Minutes per session: Not on file  . Stress: Not on file  Relationships  . Social connections:    Talks on phone: Not on file    Gets together: Not on file    Attends religious service: Not on file    Active member of club or organization: Not on file    Attends meetings of clubs or organizations: Not on file    Relationship status: Not on file  . Intimate partner violence:    Fear of current or ex partner: Not on file    Emotionally abused: Not on file    Physically abused: Not on file    Forced sexual activity: Not on file  Other Topics Concern  . Not on file  Social History Narrative  . Not on file    REVIEW OF SYSTEMS: Constitutional: Generally patient denies weakness, fatigue.  Since getting to rehab his left sided weakness has improved, balance slightly better but needing to use a walker for ambulation. ENT:  No nose or gum bleeding Pulm: No significant shortness of breath or cough CV:  No palpitations, no LE edema.  GU:  No hematuria, no frequency GI:  Per HPI Heme:  Per HPI.  Denies excessive bleeding or bruising other than since receiving Lovenox he acquired medium bruises in his lower abdomen. Transfusions:  Per HPI Neuro: Seizures.  No headaches.  No double vision.  Compromised balance.  Left-sided  weakness. Derm: Periodic eruption of boils on his buttocks. Endocrine:  No sweats or chills.  No polyuria or dysuria Immunization: Did not inquire as to recent or previous vaccinations. Travel:  None beyond local counties in last few months.    PHYSICAL  EXAM: Vital signs in last 24 hours: Vitals:   09/05/17 0448 09/05/17 0814  BP: 116/81   Pulse: 98   Resp: 18   Temp: 98.4 F (36.9 C)   SpO2: 97% 99%   Wt Readings from Last 3 Encounters:  08/28/17 93.2 kg  08/13/17 86.2 kg  12/04/16 84.4 kg    General: Pleasant, elderly WM.  He looks pretty good for 82 and for just having had a stroke.  Thin being cachectic. Head: Facial asymmetry or swelling.  No signs of head trauma. Eyes: Conjunctiva pale.  No scleral icterus. Ears: Not hard of hearing. Nose: No congestion or discharge. Mouth: Full dentures in place, not removed for exam.  Tongue midline.  Oral mucosa moist, clear, pink. Neck: No JVD, no masses, no thyromegaly. Lungs: Clear to auscultation bilaterally.  No cough, no labored breathing. Heart: RRR with 1/6 systolic murmur. Abdomen: Soft, nontender, nondistended.  Active bowel sounds.  No HSM, organomegaly, masses, bruits.  All to medium bruises bilaterally in the lower abdomen consistent with Lovenox injection Rectal: Healing boil on the right inner buttocks.  Small visible external hemorrhoids as well as palpable internal hemorrhoids.  No rectal masses.  Stool is dark brown, 3+ FOBT + but no red blood seen Musc/Skeltl: No joint redness, swelling or gross deformity. Extremities: No CCE. Neurologic: Alert.  Oriented x3.  Good historian.  Moves all 4 limbs, strength not tested.  No tremors.  No gross neurologic deficits. Skin: No telangiectasia, rashes. Nodes: No cervical or inguinal adenopathy. Psych: Pleasant, calm, cooperative, in good spirits.  Intake/Output from previous day: 08/21 0701 - 08/22 0700 In: 840 [P.O.:840] Out: 1000 [Urine:1000] Intake/Output this shift: Total I/O In: 240 [P.O.:240] Out: 225 [Urine:225]  LAB RESULTS: Recent Labs    09/05/17 0502 09/05/17 0907  WBC 5.9 6.5  HGB 7.7* 7.9*  HCT 25.1* 26.2*  PLT 219 204   BMET Lab Results  Component Value Date   NA 145 09/04/2017   NA 142  08/29/2017   NA 137 08/26/2017   K 4.5 09/04/2017   K 4.3 08/29/2017   K 4.3 08/26/2017   CL 119 (H) 09/04/2017   CL 114 (H) 08/29/2017   CL 110 08/26/2017   CO2 20 (L) 09/04/2017   CO2 21 (L) 08/29/2017   CO2 18 (L) 08/26/2017   GLUCOSE 100 (H) 09/04/2017   GLUCOSE 109 (H) 08/29/2017   GLUCOSE 127 (H) 08/26/2017   BUN 68 (H) 09/04/2017   BUN 64 (H) 08/29/2017   BUN 53 (H) 08/26/2017   CREATININE 3.85 (H) 09/04/2017   CREATININE 3.53 (H) 08/29/2017   CREATININE 3.31 (H) 08/26/2017   CALCIUM 8.4 (L) 09/04/2017   CALCIUM 8.5 (L) 08/29/2017   CALCIUM 8.5 (L) 08/26/2017   LFT No results for input(s): PROT, ALBUMIN, AST, ALT, ALKPHOS, BILITOT, BILIDIR, IBILI in the last 72 hours. PT/INR Lab Results  Component Value Date   INR 1.1 02/08/2011   Hepatitis Panel No results for input(s): HEPBSAG, HCVAB, HEPAIGM, HEPBIGM in the last 72 hours. C-Diff No components found for: CDIFF Lipase  No results found for: LIPASE  Drugs of Abuse  No results found for: LABOPIA, COCAINSCRNUR, LABBENZ, AMPHETMU, THCU, LABBARB   RADIOLOGY STUDIES: No results found.  IMPRESSION:   *   FOBT + anemia.  Not melena, not hematochezia or burgundy stool.  No previous EGD or colonoscopy.  Interestingly, despite his existing CAD, he is not particularly symptomatic from the anemia.  No blood transfusion yet ordered.  *   CVA.  On new Plavix and mg ASA (was 325 mg, now 81).  These are not on hold.   *   Stage 4/5 CKD.  This is certainly contributing to his anemia but not the blood intact in the stool  *   GERD.  Takes Prilosec at home, currently on Protonix 40 mg daily.  *   CAD.  Cardiac cath 07/2015  Mid RCA lesion, 100% stenosed. The lesion was not previously treated.  Mid LAD lesion, 75% stenosed. The lesion was not previously treated.  Ramus lesion, 100% stenosed. The lesion was not previously treated.  Ost Cx to Dist Cx lesion, 75% stenosed. The lesion was not previously  treated.  Dist LAD-2 lesion, 70% stenosed. The lesion was not previously treated.  Dist LAD-1 lesion, 50% stenosed. The lesion was not previously treated.  normal right pulmonary pressures  normal left ventricular function ejection fraction of 60%  multivessel coronary disease probably requiring coronary bypass surger Due to his lung disease and overall health status was not deemed a candidate for OHS/CABG   PLAN:     *  Should we hold the Plavix and/or aspirin?  *   Given his inoperable CAD, and that was in 2017, is he a candidate for colonoscopy/EGD?  *    Check a CBC again tomorrow morning.  Consider transfusing given his known CAD despite his being asymptomatic,    Azucena Freed  09/05/2017, 1:06 PM Phone [phone]

## 2017-09-05 NOTE — Progress Notes (Signed)
Physical Therapy Discharge Summary  Patient Details  Name: [name] MRN: [id number] Date of Birth: 12-Aug-1935  Today's Date: 09/06/2017 PT Individual Time: 0900-0940 PT Individual Time Calculation (min): 40 min   Pt in w/c and agreeable to therapy, denies pain. Session focused on functional mobility and d/c planning. Performed functional mobility as detailed below. Pt is supervision at the w/c level but requires min assist w/ gait and negotiating stairs. Pt limited by fatigue this session, requiring prolonged seated rest breaks to recover increased work of breathing. Per chart review, pt anemic and awaiting blood transfusion. Educated pt on how this would affect his fatigue level, he denied dizziness and stated he felt strong otherwise. Returned to room and ended session in supine, call bell within reach and all needs met. Missed 20 min of skilled PT 2/2 fatigue level.   Patient has met 8 of 8 long term goals due to improved activity tolerance, improved balance, improved postural control, increased strength, ability to compensate for deficits, improved attention, improved awareness and improved coordination.  Patient to discharge at a wheelchair level Supervision.   Patient's care partner is independent to provide the necessary physical assistance at discharge. Pt's wife is able to provide supervision level assist for stand pivot transfers while pt remains at w/c level. Pt's granddaughter has been trained on providing min assist for negotiating 2 steps and for car transfer. Pt and wife verbalize understanding of safety risks with going home vs going to SNF, they continued to decline SNF option despite increased safety risk in the home environment.   Reasons goals not met: n/a  Recommendation:  Patient will benefit from ongoing skilled PT services in home health setting to continue to advance safe functional mobility, address ongoing impairments in functional mobility, balance, postural control,  gait, and coordination, and minimize fall risk.  Equipment: 18x18 w/c  Reasons for discharge: treatment goals met and discharge from hospital  Patient/family agrees with progress made and goals achieved: Yes  PT Discharge Precautions/Restrictions Precautions Precautions: Fall Restrictions Weight Bearing Restrictions: No Vital Signs Oxygen Therapy SpO2: 97 % O2 Device: Room Air Vision/Perception  Perception Perception: Within Functional Limits Praxis Praxis: Intact  Cognition Overall Cognitive Status: Within Functional Limits for tasks assessed Arousal/Alertness: Awake/alert Orientation Level: Oriented X4 Attention: Selective Selective Attention: Appears intact Memory: Appears intact Awareness: Appears intact Awareness Impairment: Emergent impairment;Anticipatory impairment Problem Solving: Appears intact Safety/Judgment: Appears intact Sensation Sensation Light Touch: Appears Intact Hot/Cold: Appears Intact Proprioception: Appears Intact Stereognosis: Not tested Coordination Gross Motor Movements are Fluid and Coordinated: No Fine Motor Movements are Fluid and Coordinated: No Coordination and Movement Description: Overshoots, decreased coordination entire L side Finger Nose Finger Test: slow  Heel Shin Test: LLE remains impaired Motor  Motor Motor: Hemiplegia;Ataxia Motor - Skilled Clinical Observations: L hemi, generalized weakness Motor - Discharge Observations: Mild L hemi, mild ataxia w/ gait, generalized weakness  Mobility Bed Mobility Bed Mobility: Rolling Right;Rolling Left;Supine to Sit;Sit to Supine Rolling Right: Supervision/verbal cueing Rolling Left: Supervision/Verbal cueing Supine to Sit: Supervision/Verbal cueing Sit to Supine: Supervision/Verbal cueing Transfers Transfers: Stand to Sit;Sit to Stand;Stand Pivot Transfers Sit to Stand: Supervision/Verbal cueing Stand to Sit: Supervision/Verbal cueing Stand Pivot Transfers:  Supervision/Verbal cueing Stand Pivot Transfer Details: Verbal cues for safe use of DME/AE;Verbal cues for precautions/safety Transfer (Assistive device): Rolling walker Locomotion  Gait Ambulation: Yes Gait Assistance: Contact Guard/Touching assist Gait Distance (Feet): 25 Feet Assistive device: Rolling walker Gait Assistance Details: Verbal cues for safe use of DME/AE;Verbal cues for  gait pattern Gait Gait: Yes Gait Pattern: Impaired Gait Pattern: Ataxic;Right foot flat;Left foot flat Gait velocity: decreased Stairs / Additional Locomotion Stairs: Yes Stairs Assistance: Contact Guard/Touching assist Stair Management Technique: One rail Left Number of Stairs: 2 Height of Stairs: 6 Wheelchair Mobility Wheelchair Mobility: Yes Wheelchair Assistance: Chartered loss adjuster: Both upper extremities Wheelchair Parts Management: Supervision/cueing Distance: 150'  Trunk/Postural Assessment  Cervical Assessment Cervical Assessment: Within Functional Limits Thoracic Assessment Thoracic Assessment: Within Functional Limits Lumbar Assessment Lumbar Assessment: Within Functional Limits Postural Control Postural Control: Deficits on evaluation Righting Reactions: delayed, although improved  Balance Balance Balance Assessed: Yes Static Sitting Balance Static Sitting - Balance Support: No upper extremity supported;Feet supported Static Sitting - Level of Assistance: 6: Modified independent (Device/Increase time) Dynamic Sitting Balance Dynamic Sitting - Balance Support: No upper extremity supported;Feet supported Dynamic Sitting - Level of Assistance: 5: Stand by assistance Static Standing Balance Static Standing - Balance Support: During functional activity;Bilateral upper extremity supported Static Standing - Level of Assistance: 5: Stand by assistance Dynamic Standing Balance Dynamic Standing - Balance Support: Left upper extremity supported;During  functional activity Dynamic Standing - Level of Assistance: 4: Min assist Extremity Assessment  RUE Assessment RUE Assessment: Within Functional Limits LUE Assessment LUE Assessment: Exceptions to [facility] Passive Range of Motion (PROM) Comments: limited PROM at 90 degreessecondary to rotator cuff injury Active Range of Motion (AROM) Comments: limited AROM at 90 degreessecondary to rotator cuff injury General Strength Comments: 2-/5 shoulder, 4/5 elbow, wrist, and digits LUE Body System: Neuro Brunstrum levels for arm and hand: Arm;Hand Brunstrum level for arm: Stage V Relative Independence from Synergy Brunstrum level for hand: Stage VI Isolated joint movements RLE Assessment RLE Assessment: Exceptions to [facility] Passive Range of Motion (PROM) Comments: Ankle limited to 0 deg DF, pt states this is baseline for him 2/2 arthritis General Strength Comments: WFL LLE Assessment LLE Assessment: Exceptions to [facility] Passive Range of Motion (PROM) Comments: Ankle limited to 0 deg DF, pt states this is baseline for him 2/2 arthritis General Strength Comments: 4/5 hip and knee musculature, 3/5 ankle    See Function Navigator for Current Functional Status.  Glendene Wyer K Arnette 09/06/2017, 9:48 AM

## 2017-09-05 NOTE — Discharge Instructions (Signed)
Inpatient Rehab Discharge Instructions  [name] Discharge date and time:    Activities/Precautions/ Functional Status: Activity: no lifting, driving, or strenuous exercise  till cleared by MD Diet: cardiac diet Wound Care: Apply warm/moist compresses to area couple of times a day and attempt to express if cyst flares up again.    Functional status:  ___ No restrictions     ___ Walk up steps independently ___ 24/7 supervision/assistance   ___ Walk up steps with assistance ___ Intermittent supervision/assistance  ___ Bathe/dress independently ___ Walk with walker     ___ Bathe/dress with assistance ___ Walk Independently    ___ Shower independently ___ Walk with assistance    ___ Shower with assistance ___ No alcohol     ___ Return to work/school ________  Special Instructions:    COMMUNITY REFERRALS UPON DISCHARGE:    Home Health:   PT, OT, RN, [place]   Date of last service:09/07/2017   Medical Equipment/Items [place]  [phone]   GENERAL COMMUNITY RESOURCES FOR PATIENT/FAMILY: Support Groups:[facility] SUPPORT GROUP EVERY SECOND Thursday @ 6:00-7:00 PM ON THE REHAB UNIT QUESTIONS CALL [phone]  STROKE/TIA DISCHARGE INSTRUCTIONS SMOKING Cigarette smoking nearly doubles your risk of having a stroke & is the single most alterable risk factor  If you smoke or have smoked in the last 12 months, you are advised to quit smoking for your health.  Most of the excess cardiovascular risk related to smoking disappears within a year of stopping.  Ask you doctor about anti-smoking medications  [place] Quit Line: 1-800-QUIT NOW  Free Smoking Cessation Classes [phone]  CHOLESTEROL Know your levels; limit fat & cholesterol in your diet  Lipid Panel     Component Value Date/Time   CHOL 157 08/15/2017 0410   CHOL 136 02/08/2011 0441   TRIG 204 (H) 08/15/2017 0410   TRIG  130 02/08/2011 0441   HDL 24 (L) 08/15/2017 0410   HDL 23 (L) 02/08/2011 0441   CHOLHDL 6.5 08/15/2017 0410   VLDL 41 (H) 08/15/2017 0410   VLDL 26 02/08/2011 0441   LDLCALC 92 08/15/2017 0410   LDLCALC 87 02/08/2011 0441      Many patients benefit from treatment even if their cholesterol is at goal.  Goal: Total Cholesterol (CHOL) less than 160  Goal:  Triglycerides (TRIG) less than 150  Goal:  HDL greater than 40  Goal:  LDL (LDLCALC) less than 100   BLOOD PRESSURE American Stroke Association blood pressure target is less that 120/80 mm/Hg  Your discharge blood pressure is:  BP: 135/88  Monitor your blood pressure  Limit your salt and alcohol intake  Many individuals will require more than one medication for high blood pressure  DIABETES (A1c is a blood sugar average for last 3 months) Goal HGBA1c is under 7% (HBGA1c is blood sugar average for last 3 months)  Diabetes: No known diagnosis of diabetes    Lab Results  Component Value Date   HGBA1C 5.4 08/16/2017     Your HGBA1c can be lowered with medications, healthy diet, and exercise.  Check your blood sugar as directed by your physician  Call your physician if you experience unexplained or low blood sugars.  PHYSICAL ACTIVITY/REHABILITATION Goal is 30 minutes at least 4 days per week  Activity: No driving, Therapies: see above Return to work: N/A  Activity decreases your risk of heart attack and stroke and makes your heart stronger.  It helps  control your weight and blood pressure; helps you relax and can improve your mood.  Participate in a regular exercise program.  Talk with your doctor about the best form of exercise for you (dancing, walking, swimming, cycling).  DIET/WEIGHT Goal is to maintain a healthy weight  Your discharge diet is:  Diet Order           Diet Heart Room service appropriate? Yes; Fluid consistency: Thin  Diet effective now         liquids Your height is:  Height: 5\' 9"  (175.3  cm) Your current weight is: Weight: 93.1 kg (205 lb 4 oz) Your Body Mass Index (BMI) is:  BMI (Calculated): 30.3  Following the type of diet specifically designed for you will help prevent another stroke.  Your goal weight is:    Your goal Body Mass Index (BMI) is 19-24.  Healthy food habits can help reduce 3 risk factors for stroke:  High cholesterol, hypertension, and excess weight.  RESOURCES Stroke/Support Group:  Call [phone]   STROKE EDUCATION PROVIDED/REVIEWED AND GIVEN TO PATIENT Stroke warning signs and symptoms How to activate emergency medical system (call 911). Medications prescribed at discharge. Need for follow-up after discharge. Personal risk factors for stroke. Pneumonia vaccine given:  Flu vaccine given:  My questions have been answered, the writing is legible, and I understand these instructions.  I will adhere to these goals & educational materials that have been provided to me after my discharge from the hospital.     My questions have been answered and I understand these instructions. I will adhere to these goals and the provided educational materials after my discharge from the hospital.  Patient/Caregiver Signature _______________________________ Date __________  Clinician Signature _______________________________________ Date __________  Please bring this form and your medication list with you to all your follow-up doctor's appointments.

## 2017-09-05 NOTE — Anesthesia Preprocedure Evaluation (Deleted)
Anesthesia Evaluation    Reviewed: Allergy & Precautions, Patient's Chart, lab work & pertinent test results  Airway        Dental   Pulmonary COPD,  COPD inhaler,           Cardiovascular hypertension, Pt. on home beta blockers + CAD and + Peripheral Vascular Disease  + Valvular Problems/Murmurs (mild AS) AS   ECHO: Left ventricle: The cavity size was normal. There was mild concentric hypertrophy. Systolic function was normal. The estimated ejection fraction was in the range of 60% to 65%. Wall motion was normal; there were no regional wall motion abnormalities. Doppler parameters are consistent with abnormal left ventricular relaxation (grade 1 diastolic dysfunction). Aortic valve: moderately calcified leaflets. There was very mild stenosis. Valve area (VTI): 1.61 cm^2. Left atrium: The atrium was normal in size. Right ventricle: Systolic function was normal. Pulmonary arteries: Systolic pressure was within the normal range.  CATH: Mid RCA lesion, 100% stenosed. The lesion was not previously treated. Mid LAD lesion, 75% stenosed. The lesion was not previously treated. Ramus lesion, 100% stenosed. The lesion was not previously treated. Ost Cx to Dist Cx lesion, 75% stenosed. The lesion was not previously treated. Dist LAD-2 lesion, 70% stenosed. The lesion was not previously treated. Dist LAD-1 lesion, 50% stenosed. The lesion was not previously treated. 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  He does have some balance deficits. CVA, Residual Symptoms negative psych ROS   GI/Hepatic Neg liver ROS, GERD  Medicated,  Endo/Other  negative endocrine ROS  Renal/GU CRFRenal disease     Musculoskeletal negative musculoskeletal ROS (+)    Abdominal (+) + obese,   Peds  Hematology  (+) anemia , HLD   Anesthesia Other Findings gi bleed anemia  Reproductive/Obstetrics                             Anesthesia Physical Anesthesia Plan  ASA: IV  Anesthesia Plan: MAC   Post-op Pain Management:    Induction:   PONV Risk Score and Plan: 1 and Propofol infusion and Treatment may vary due to age or medical condition  Airway Management Planned: Nasal Cannula  Additional Equipment:   Intra-op Plan:   Post-operative Plan:   Informed Consent:   Plan Discussed with:   Anesthesia Plan Comments:         Anesthesia Quick Evaluation

## 2017-09-05 NOTE — Progress Notes (Signed)
Occupational Therapy Session Note  Patient Details  Name: [name] MRN: [id number] Date of Birth: 1935/06/22  Today's Date: 09/05/2017 OT Individual Time: 1300-1345 OT Individual Time Calculation (min): 45 min    Short Term Goals: Week 2:  OT Short Term Goal 1 (Week 2): STG=LTG secondary to ELOS  Skilled Therapeutic Interventions/Progress Updates:    Pt resting in w/c upon arrival and requested to use toilet.  Pt performed stand pivot transfer to toilet with steady A and completed all toileting tasks at supervision level.  Pt transitioned to gym to practice stand pivot transfers to simulate home setup.  Pt completed stand pivot transfers X 5 with RW, progressing to supervision.  Pt returned to room at request of GI MD to perform exam. Pt performed squat pivot transfer to bed with supervision and remained in bed with family present.   Therapy Documentation Precautions:  Precautions Precautions: Fall Restrictions Weight Bearing Restrictions: No General: General OT Amount of Missed Time: 15 Minutes Pain:  Pt denies pain  See Function Navigator for Current Functional Status.   Therapy/Group: Individual Therapy  Leroy Libman 09/05/2017, 1:54 PM

## 2017-09-05 NOTE — Progress Notes (Signed)
Subjective/Complaints: Pt's wife ok with taking pt home, per conference PT, OT feel Sup level can be achieved by Sat Reviewed CBC results with pt ROS:  Denies CP SOB N/V/D  Objective: Vital Signs: Blood pressure 116/81, pulse 98, temperature 98.4 F (36.9 C), temperature source Oral, resp. rate 18, height '5\' 9"'  (1.753 m), weight 93.2 kg, SpO2 99 %. No results found. Results for orders placed or performed during the hospital encounter of 08/19/17 (from the past 72 hour(s))  Basic metabolic panel     Status: Abnormal   Collection Time: 09/04/17  5:59 AM  Result Value Ref Range   Sodium 145 135 - 145 mmol/L   Potassium 4.5 3.5 - 5.1 mmol/L   Chloride 119 (H) 98 - 111 mmol/L   CO2 20 (L) 22 - 32 mmol/L   Glucose, Bld 100 (H) 70 - 99 mg/dL   BUN 68 (H) 8 - 23 mg/dL   Creatinine, Ser 3.85 (H) 0.61 - 1.24 mg/dL   Calcium 8.4 (L) 8.9 - 10.3 mg/dL   GFR calc non Af Amer 14 (L) >60 mL/min   GFR calc Af Amer 16 (L) >60 mL/min    Comment: (NOTE) The eGFR has been calculated using the CKD EPI equation. This calculation has not been validated in all clinical situations. eGFR's persistently <60 mL/min signify possible Chronic Kidney Disease.    Anion gap 6 5 - 15    Comment: Performed at [facility] [address] [place], [place] [address]  CBC     Status: Abnormal   Collection Time: 09/05/17  5:02 AM  Result Value Ref Range   WBC 5.9 4.0 - 10.5 K/uL   RBC 2.77 (L) 4.22 - 5.81 MIL/uL   Hemoglobin 7.7 (L) 13.0 - 17.0 g/dL   HCT 25.1 (L) 39.0 - 52.0 %   MCV 90.6 78.0 - 100.0 fL   MCH 27.8 26.0 - 34.0 pg   MCHC 30.7 30.0 - 36.0 g/dL   RDW 16.4 (H) 11.5 - 15.5 %   Platelets 219 150 - 400 K/uL    Comment: Performed at [facility] [address] [place], [place] [address]     HENT: Normocephalic, atraumatic Eyes: EOMI. No discharge. Cardio: RRR. No JVD. Resp: CTA B/L and unlabored GI: BS and ND Skin: Warm and dry. Intact. Neuro: Alert/Oriented Motor: Motor: Left  delt 3-/5, bi, tri 2+/5, grip 3+/5  LLE: 4+/5 proximal 2/5 distally Musc/Skel:  No edema or tenderness in extremities, decreased ROM Left shoulder abd (pt states this is chronic from "rotary cuff problem") Gen NAD. Vital signs reviewed   Assessment/Plan: 1. 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 Physiatrist is providing close team supervision and 24 hour management of active medical problems listed below. Physiatrist and rehab team continue to assess barriers to discharge/monitor patient progress toward functional and medical goals. FIM: Function - Bathing Bathing activity did not occur: Refused Position: Shower Body parts bathed by patient: Right arm, Left arm, Chest, Abdomen, Front perineal area, Right upper leg, Buttocks, Left upper leg, Right lower leg, Left lower leg Body parts bathed by helper: Right arm, Buttocks, Back Bathing not applicable: Back Assist Level: Touching or steadying assistance(Pt > 75%)  Function- Upper Body Dressing/Undressing What is the patient wearing?: Pull over shirt/dress Pull over shirt/dress - Perfomed by patient: Put head through opening, Pull shirt over trunk, Thread/unthread right sleeve, Thread/unthread left  sleeve Pull over shirt/dress - Perfomed by helper: Thread/unthread right sleeve, Thread/unthread left sleeve Button up shirt - Perfomed by patient: Thread/unthread right sleeve, Thread/unthread left sleeve, Pull shirt around back Button up shirt - Perfomed by helper: Button/unbutton shirt Assist Level: Supervision or verbal cues Function - Lower Body Dressing/Undressing What is the patient wearing?: Underwear, Pants, Non-skid slipper socks, Ted Hose Position: Wheelchair/chair at [employer] - Performed by patient: Thread/unthread right underwear leg, Thread/unthread left underwear leg, Pull underwear up/down Pants-  Performed by patient: Thread/unthread right pants leg, Thread/unthread left pants leg, Pull pants up/down Pants- Performed by helper: Pull pants up/down Non-skid slipper socks- Performed by patient: Don/doff right sock, Don/doff left sock Non-skid slipper socks- Performed by helper: Don/doff right sock, Don/doff left sock Socks - Performed by patient: Don/doff right sock Socks - Performed by helper: Don/doff right sock, Don/doff left sock Shoes - Performed by patient: Don/doff right shoe, Don/doff left shoe Shoes - Performed by helper: Fasten right, Fasten left TED Hose - Performed by helper: Don/doff right TED hose, Don/doff left TED hose Assist for footwear: Partial/moderate assist Assist for lower body dressing: Supervision or verbal cues  Function - Toileting Toileting activity did not occur: No continent bowel/bladder event Toileting steps completed by patient: Adjust clothing prior to toileting, Performs perineal hygiene, Adjust clothing after toileting Toileting steps completed by helper: Adjust clothing after toileting Toileting Assistive Devices: Grab bar or rail, Toilet aid Assist level: Touching or steadying assistance (Pt.75%)  Function - Air cabin crew transfer assistive device: Elevated toilet seat/BSC over toilet, Walker, Grab bar Assist level to toilet: Touching or steadying assistance (Pt > 75%) Assist level from toilet: Touching or steadying assistance (Pt > 75%)  Function - Chair/bed transfer Chair/bed transfer method: Stand pivot Chair/bed transfer assist level: Touching or steadying assistance (Pt > 75%) Chair/bed transfer assistive device: Armrests, Walker Chair/bed transfer details: Verbal cues for safe use of DME/AE, Verbal cues for precautions/safety, Verbal cues for technique  Function - Locomotion: Wheelchair Will patient use wheelchair at discharge?: (TBD) Type: Manual Max wheelchair distance: 150' Assist Level: Supervision or verbal cues Assist  Level: Supervision or verbal cues Wheel 150 feet activity did not occur: Safety/medical concerns Assist Level: Supervision or verbal cues Turns around,maneuvers to table,bed, and toilet,negotiates 3% grade,maneuvers on rugs and over doorsills: No Function - Locomotion: Ambulation Assistive device: Walker-rolling Max distance: 10 ft Assist level: Touching or steadying assistance (Pt > 75%) Assist level: Touching or steadying assistance (Pt > 75%) Walk 50 feet with 2 turns activity did not occur: Safety/medical concerns Walk 150 feet activity did not occur: Safety/medical concerns Walk 10 feet on uneven surfaces activity did not occur: Safety/medical concerns  Function - Comprehension Comprehension: Auditory Comprehension assist level: Follows complex conversation/direction with extra time/assistive device  Function - Expression Expression: Verbal Expression assist level: Expresses complex ideas: With extra time/assistive device  Function - Social Interaction Social Interaction assist level: Interacts appropriately 90% of the time - Needs monitoring or encouragement for participation or interaction.  Function - Problem Solving Problem solving assist level: Solves basic problems with no assist  Function - Memory Memory assist level: Recognizes or recalls 90% of the time/requires cueing < 10% of the time Patient normally able to recall (first 3 days only): Current season, Location of own room, Staff names and faces, That he or she is in a hospital  Medical Problem List and Plan: 1.  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 Extend stay to 8/24   2.  DVT Prophylaxis/Anticoagulation: Pharmaceutical: Lovenox 3. Idiopathic neuropathy/Pain Management: Gabapentin was added for LUE dysesthesias. Continue ultram prn as at home.   4. Mood: LCSW to follow for evaluation and support.  5. Neuropsych: This patient is capable of making decisions on his own behalf. 6. Skin/Wound Care: routine pressure relief measures.  7. Fluids/Electrolytes/Nutrition: Monitor I/O. D/C IV enc po in preparation for home d/c 8. COPD: Increased albuterol to every 4 hours prn. Continue Onoro. Encourage IS to help with atelectasis 9.  Acute on chronic renal failure--stage IV:  Baseline SCr-3.65. On calcitriol for secondary hyperparathyroidism. GFR stable, BUN a little up. Avoid ARB/ No ACE due to allergy.   Currently at baseline  Encourage fluid intake- 963m yesterday           IVF daily at bedtime started on 8/8 THis may be contributing to anemia 10. H/o gastritis due to NSAIDs/ GERD: Continue PPI.  11. Dyslipidemia: on Lipitor.  12. CAD/AS: Treated medically with metoprolol, ASA and lipitor.  13. Peri anal cyst:  With acute on chronic inflammation. No prior treatment per reports. Started on Augmentin on 8/3 for  7-10 days treatment. Continue moist warm compresses to help drain.    14. ABLA on Anemia of chronic disease: Hgb- 11.2-12.5 range. Continue to monitor.   Given large drop in absence of no visible bleed (urine clear , no epistaxis or hematochezia/melena/hematemesis)- will repeat CBC stat  Stool OB pos,is on Lovenox, given further drop to 7.7 ill stop lovenox, at this time will not change ASA Plavix  and consult GI- await further rec 15. Constipation: Scheduled Senna S 2 at bedtime. 16.  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 Multifocal neuropathy, hx of sciatica, may benefit from outpt EMG to further evaluate 17. Blood pressure- off antihypertensive meds Vitals:   09/05/17 0448 09/05/17 0814  BP: 116/81   Pulse: 98   Resp: 18   Temp: 98.4 F (36.9 C)   SpO2: 97% 99%   18. Urinary frequency  I: 5667m   PVRs ordered  Condom cath qhs until PVRs/IVF 19.  Chronic  left shoulder contrature, incomplete abd pt states he wore some type of brace ? Cuff sling will discuss with OT  LOS (Days) 17 A FACE TO FACE EVALUATION WAS PERFORMED  AnCharlett Blake/22/2019, 8:48 AM

## 2017-09-05 NOTE — Progress Notes (Signed)
Physical Therapy Weekly Progress Note  Patient Details  Name: [name] MRN: [id number] Date of Birth: 02/06/1935  Beginning of progress report period: August 27, 2017 End of progress report period: September 05, 2017  Today's Date: 09/05/2017 PT Individual Time: 1000-1055 AND 1400-1505 PT Individual Time Calculation (min): 55 min AND 65 min  Patient has met 3 of 8 long term goals. He continues to make slow progress towards LTGs due to fluctuating BP that can worsen L hemiplegia and leads to decreased safety. Despite this, he is supervision at a seated, w/c level and performing transfers and short distance gait w/ min assist using RW. Family has been present over last few sessions to observe. Wife plans to provide supervision level assist at the w/c level and pt's granddaughter has been trained on car transfers and steps to access home. The pt has adamantly refused option of SNF and himself and his family have accepted safety risks of d/c to home at this time. All in agreement that pt will remain at w/c level until PT at next level of care approves in-home ambulation.   Patient continues to demonstrate the following deficits muscle weakness, decreased cardiorespiratoy endurance, impaired timing and sequencing, unbalanced muscle activation, ataxia, decreased coordination and decreased motor planning, central origin and decreased standing balance, decreased postural control, hemiplegia and decreased balance strategies and therefore will continue to benefit from skilled PT intervention to increase functional independence with mobility.  Patient progressing toward long term goals..  Continue plan of care.  PT Short Term Goals Week 2:  PT Short Term Goal 1 (Week 2): STG=LTG due to ELOS Week 3:  PT Short Term Goal 1 (Week 3): =LTGs due to ELOS  Skilled Therapeutic Interventions/Progress Updates:   Session 1:  Pt in supine and agreeable to therapy, no c/o pain. Session focused on increasing  independence w/ functional tasks and education on energy conservation techniques and taking frequent rest breaks in between self-care and mobility tasks. Provided total assist to don TED hose at EOB, otherwise all other dressing tasks performed w/ supervision and verbal cues to take rests in between each step. Pt w/ increased work of breathing that resolves w/ brief rest. BP systolic stayed 140-150 while sitting at EOB w/o abdominal binder on. Pt self-propelled w/c to/from therapy gym w/ supervision using BUEs, discussed w/c propulsion in the house - educated on maneuvering tight spaces and carpets. Safer speed and more control when using BUEs vs R hemi technique. Practiced stand pivot transfer to/from recliner in ADL apartment. Recliner is similar to his one at home, except his does not rock. Min guard to close supervision for transfer w/ min assist needed to stabilize when standing from the recliner. However, anticipate pt will not need assistance w/ recliner at home as his does not rock. Total assist w/c transport back to room for energy conservation, pt w/ SOB throughout today's session, requiring longer rest breaks to recover. Improves when abdominal binder is off. RN agreeable to binder being off while sitting up in w/c. Ended session in w/c, call bell within reach and all needs met.   Session 2:  Pt in supine and agreeable to therapy, denies pain. Session focused on functional mobility, d/c planning, and family education. Wife and granddaughter present throughout session. Performed bed mobility, stand pivot transfers w/ RW, and w/c mobility at supervision level. Occasional verbal reminders for safety. Educated pt and family on energy conservation, especially considering pt's fatigue greatly affects safety. Verbal reminders throughout session to decrease   work of breathing prior to mobilizing. No c/o dizziness this session. Negotiated 2 steps as per home set-up w/ L rail, min guard from therapist x1 and from  granddaughter x1 rep. Problem solved transitioning to w/c at top of steps to enter home as well. Worked on gait training, ambulated 10'x2 w/ min guard. Discussed pt's progress w/ family and how postural control in standing has greatly improved and carries over to transfers. Returned to room and ended session in w/c and in care of family, all needs met.   Therapy Documentation Precautions:  Precautions Precautions: Fall Restrictions Weight Bearing Restrictions: No Vital Signs: Therapy Vitals Temp: 98.4 F (36.9 C) Temp Source: Oral Pulse Rate: 98 Resp: 18 BP: 116/81 Patient Position (if appropriate): Sitting Oxygen Therapy SpO2: 97 % O2 Device: Room Air  See Function Navigator for Current Functional Status.  Therapy/Group: Individual Therapy  Pallavi Clifton K Arnette 09/05/2017, 8:06 AM

## 2017-09-06 ENCOUNTER — Inpatient Hospital Stay (HOSPITAL_COMMUNITY): Payer: Medicare Other

## 2017-09-06 ENCOUNTER — Encounter (HOSPITAL_COMMUNITY): Admission: RE | Payer: Self-pay | Source: Ambulatory Visit

## 2017-09-06 ENCOUNTER — Ambulatory Visit (HOSPITAL_COMMUNITY): Admission: RE | Admit: 2017-09-06 | Payer: Medicare Other | Source: Ambulatory Visit | Admitting: Gastroenterology

## 2017-09-06 ENCOUNTER — Inpatient Hospital Stay (HOSPITAL_COMMUNITY): Payer: Medicare Other | Admitting: Physical Therapy

## 2017-09-06 DIAGNOSIS — Z7901 Long term (current) use of anticoagulants: Secondary | ICD-10-CM

## 2017-09-06 DIAGNOSIS — R195 Other fecal abnormalities: Secondary | ICD-10-CM

## 2017-09-06 LAB — BASIC METABOLIC PANEL
ANION GAP: 7 (ref 5–15)
BUN: 64 mg/dL — ABNORMAL HIGH (ref 8–23)
CALCIUM: 8.4 mg/dL — AB (ref 8.9–10.3)
CO2: 19 mmol/L — ABNORMAL LOW (ref 22–32)
Chloride: 117 mmol/L — ABNORMAL HIGH (ref 98–111)
Creatinine, Ser: 3.6 mg/dL — ABNORMAL HIGH (ref 0.61–1.24)
GFR, EST AFRICAN AMERICAN: 17 mL/min — AB (ref >60)
GFR, EST NON AFRICAN AMERICAN: 15 mL/min — AB (ref >60)
Glucose, Bld: 96 mg/dL (ref 70–99)
POTASSIUM: 3.9 mmol/L (ref 3.5–5.1)
Sodium: 143 mmol/L (ref 135–145)

## 2017-09-06 LAB — IRON AND TIBC
Iron: 40 ug/dL — ABNORMAL LOW (ref 45–182)
Saturation Ratios: 12 % — ABNORMAL LOW (ref 17.9–39.5)
TIBC: 342 ug/dL (ref 250–450)
UIBC: 302 ug/dL

## 2017-09-06 LAB — CBC
HCT: 24.2 % — ABNORMAL LOW (ref 39.0–52.0)
Hemoglobin: 7.3 g/dL — ABNORMAL LOW (ref 13.0–17.0)
MCH: 27.3 pg (ref 26.0–34.0)
MCHC: 30.2 g/dL (ref 30.0–36.0)
MCV: 90.6 fL (ref 78.0–100.0)
PLATELETS: 204 10*3/uL (ref 150–400)
RBC: 2.67 MIL/uL — AB (ref 4.22–5.81)
RDW: 16.6 % — AB (ref 11.5–15.5)
WBC: 5.9 10*3/uL (ref 4.0–10.5)

## 2017-09-06 LAB — VITAMIN B12: Vitamin B-12: 319 pg/mL (ref 180–914)

## 2017-09-06 LAB — RETICULOCYTES
RBC.: 2.66 MIL/uL — AB (ref 4.22–5.81)
RETIC COUNT ABSOLUTE: 79.8 10*3/uL (ref 19.0–186.0)
RETIC CT PCT: 3 % (ref 0.4–3.1)

## 2017-09-06 LAB — HEMOGLOBIN AND HEMATOCRIT, BLOOD
HCT: 30.4 % — ABNORMAL LOW (ref 39.0–52.0)
Hemoglobin: 9.4 g/dL — ABNORMAL LOW (ref 13.0–17.0)

## 2017-09-06 LAB — FERRITIN: FERRITIN: 3 ng/mL — AB (ref 24–336)

## 2017-09-06 LAB — PREPARE RBC (CROSSMATCH)

## 2017-09-06 LAB — FOLATE: FOLATE: 21.8 ng/mL (ref >5.9)

## 2017-09-06 SURGERY — Surgical Case
Anesthesia: *Unknown

## 2017-09-06 SURGERY — ESOPHAGOGASTRODUODENOSCOPY (EGD) WITH PROPOFOL
Anesthesia: Monitor Anesthesia Care

## 2017-09-06 MED ORDER — SODIUM CHLORIDE 0.9% IV SOLUTION
Freq: Once | INTRAVENOUS | Status: AC
  Administered 2017-09-06: 13:00:00 via INTRAVENOUS

## 2017-09-06 MED ORDER — PANTOPRAZOLE SODIUM 40 MG PO TBEC: 40.0000 mg | DELAYED_RELEASE_TABLET | Freq: Every day | ORAL | 1 refills | Status: DC

## 2017-09-06 MED ORDER — TRAMADOL HCL 50 MG PO TABS: 50.0000 mg | ORAL_TABLET | Freq: Four times a day (QID) | ORAL | 0 refills | Status: AC | PRN

## 2017-09-06 MED ORDER — GABAPENTIN 100 MG PO CAPS: 100.0000 mg | ORAL_CAPSULE | Freq: Two times a day (BID) | ORAL | 1 refills | Status: DC

## 2017-09-06 MED ORDER — ATORVASTATIN CALCIUM 40 MG PO TABS: 40.0000 mg | ORAL_TABLET | Freq: Every day | ORAL | 2 refills | Status: AC

## 2017-09-06 MED ORDER — ACETAMINOPHEN 325 MG PO TABS
650.0000 mg | ORAL_TABLET | Freq: Once | ORAL | Status: AC
  Administered 2017-09-06: 650 mg via ORAL
  Filled 2017-09-06: qty 2

## 2017-09-06 MED ORDER — PANTOPRAZOLE SODIUM 40 MG IV SOLR
40.0000 mg | Freq: Two times a day (BID) | INTRAVENOUS | Status: DC
  Administered 2017-09-06: 40 mg via INTRAVENOUS
  Filled 2017-09-06 (×2): qty 40

## 2017-09-06 MED ORDER — CLOPIDOGREL BISULFATE 75 MG PO TABS: 75.0000 mg | ORAL_TABLET | Freq: Every day | ORAL | 1 refills | Status: DC

## 2017-09-06 MED ORDER — ASPIRIN 81 MG PO TBEC: 81.0000 mg | DELAYED_RELEASE_TABLET | Freq: Every day | ORAL | Status: DC

## 2017-09-06 MED ORDER — SENNOSIDES-DOCUSATE SODIUM 8.6-50 MG PO TABS: 2.0000 | ORAL_TABLET | Freq: Every day | ORAL | Status: DC

## 2017-09-06 MED ORDER — PANTOPRAZOLE SODIUM 40 MG PO TBEC: 40.0000 mg | DELAYED_RELEASE_TABLET | Freq: Every day | ORAL | 1 refills | Status: AC

## 2017-09-06 MED ORDER — TAMSULOSIN HCL 0.4 MG PO CAPS
0.4000 mg | ORAL_CAPSULE | Freq: Every day | ORAL | 11 refills | Status: AC
Start: 2017-09-06 — End: 2018-09-06

## 2017-09-06 MED ORDER — FUROSEMIDE 10 MG/ML IJ SOLN
20.0000 mg | Freq: Once | INTRAMUSCULAR | Status: AC
  Administered 2017-09-06: 20 mg via INTRAVENOUS
  Filled 2017-09-06: qty 2

## 2017-09-06 MED ORDER — PANTOPRAZOLE SODIUM 40 MG PO TBEC: 40.0000 mg | DELAYED_RELEASE_TABLET | Freq: Two times a day (BID) | ORAL | 1 refills | Status: DC

## 2017-09-06 MED ORDER — CITALOPRAM HYDROBROMIDE 20 MG PO TABS: 30.0000 mg | ORAL_TABLET | Freq: Every day | ORAL | 0 refills | Status: AC

## 2017-09-06 NOTE — Progress Notes (Signed)
Occupational Therapy Session Note  Patient Details  Name: DEAIRE MCWHIRTER MRN: [id number] Date of Birth: 17-Jun-1935  Today's Date: 09/06/2017 OT Individual Time: 1400-1443 OT Individual Time Calculation (min): 43 min    Skilled Therapeutic Interventions/Progress Updates:    1;1. Pt with no c/o pain reciveing transfusion. RN cleared to participate in tx. Pt completes supine>sitting with supervision and squat pivot transfer with min A to w/c. Pt completes LUE NMR with scrabble tiles practicing palm<>finger translation of tiles onto table and tiles onto scrabble board to spell name. Pt requries cuing to decrease compensatory strategies as well as decrease shoulder hike. Pt completes 9HPT 34 seconds on R and 2 min on L. Exited session with pt seated in w/c, call light in reach, belt alarm on and all needs met  Therapy Documentation Precautions:  Precautions Precautions: Fall Restrictions Weight Bearing Restrictions: No General:   Vital Signs: Therapy Vitals Temp: 99 F (37.2 C) Temp Source: Oral Pulse Rate: 94 Resp: 20 BP: (!) 149/90 Patient Position (if appropriate): Sitting Oxygen Therapy SpO2: 96 % O2 Device: Room Air Pain: Pain Assessment Faces Pain Scale: No hurt  See Function Navigator for Current Functional Status.   Therapy/Group: Individual Therapy  Tonny Branch 09/06/2017, 2:42 PM

## 2017-09-06 NOTE — Progress Notes (Signed)
Social Work  Discharge Note  The overall goal for the admission was met for:DC SAT 8/24   Discharge location: Yes-HOME WITH WIFE WHO CAN NOT PROVIDE ASSISTANCE-DISTANT SUPERVISION  Length of Stay: Yes-19 DAYS  Discharge activity level: Yes-SUPERVISION TRANSFERS-MIN AMBULATION  Home/community participation: Yes  Services provided included: MD, RD, PT, OT, SLP, RN, CM, Pharmacy, Neuropsych and SW  Financial Services: Private Insurance: [facility]  Follow-up services arranged: Home Health: [facility] CARE-PT,OT,RN,AIDE, DME: [facility] and Patient/Family request agency HH: USED [facility], DME: NO PREF  Comments (or additional information):PT PROGRESSED AND FAMILY FEELS [place]. WIFE IS NOT ABLE TO PROVIDE CARE AND PT NEEDS TO STAY IN A WHEELCHAIR AT HOME UNLESS A THERAPIST IS THERE.AWARE HIGH RISK TO FALL AT HOME AND TO SLOW DOWN WHEN MOVING/TRANSFERRING. PT WOULD RATHER GO HOME AND DIE THEN GO TO A NH, SO WILL TRY AT HOME AND SEE [facility]. ALL IN AGREEMENT WITH THIS.  Patient/Family verbalized understanding of follow-up arrangements: Yes  Individual responsible for coordination of the follow-up plan: JOEL-WIFE  Confirmed correct DME delivered: Elease Hashimoto 09/06/2017    Elease Hashimoto

## 2017-09-06 NOTE — Progress Notes (Signed)
Social Work Patient ID: [name], male   DOB: [birth year], 82 y.o.   MRN: [id number] Met with pt who reports he is feeling much better since getting blood he is on his second transfusion. He reports ready to go home tomorrow. He feels comfortable with this plan.

## 2017-09-06 NOTE — Progress Notes (Signed)
Subjective/Complaints: Appreciate GI note, no N/V/D no abd pain , "dark stools" but no BRBPR Pt does not wish to have endoscopy based on anesthetic risk, has CAD was told by MD at [facility] "you'll never wake up from anesthesia" ROS:  Denies CP SOB N/V/D  Objective: Vital Signs: Blood pressure 123/72, pulse 82, temperature 97.8 F (36.6 C), resp. rate 20, height '5\' 9"'  (1.753 m), weight 93.2 kg, SpO2 97 %. No results found. Results for orders placed or performed during the hospital encounter of 08/19/17 (from the past 72 hour(s))  Basic metabolic panel     Status: Abnormal   Collection Time: 09/04/17  5:59 AM  Result Value Ref Range   Sodium 145 135 - 145 mmol/L   Potassium 4.5 3.5 - 5.1 mmol/L   Chloride 119 (H) 98 - 111 mmol/L   CO2 20 (L) 22 - 32 mmol/L   Glucose, Bld 100 (H) 70 - 99 mg/dL   BUN 68 (H) 8 - 23 mg/dL   Creatinine, Ser 3.85 (H) 0.61 - 1.24 mg/dL   Calcium 8.4 (L) 8.9 - 10.3 mg/dL   GFR calc non Af Amer 14 (L) >60 mL/min   GFR calc Af Amer 16 (L) >60 mL/min    Comment: (NOTE) The eGFR has been calculated using the CKD EPI equation. This calculation has not been validated in all clinical situations. eGFR's persistently <60 mL/min signify possible Chronic Kidney Disease.    Anion gap 6 5 - 15    Comment: Performed at [facility] [address], [place], [place] [address]  CBC     Status: Abnormal   Collection Time: 09/05/17  5:02 AM  Result Value Ref Range   WBC 5.9 4.0 - 10.5 K/uL   RBC 2.77 (L) 4.22 - 5.81 MIL/uL   Hemoglobin 7.7 (L) 13.0 - 17.0 g/dL   HCT 25.1 (L) 39.0 - 52.0 %   MCV 90.6 78.0 - 100.0 fL   MCH 27.8 26.0 - 34.0 pg   MCHC 30.7 30.0 - 36.0 g/dL   RDW 16.4 (H) 11.5 - 15.5 %   Platelets 219 150 - 400 K/uL    Comment: Performed at [facility] [address], [place], [place] [address]  CBC     Status: Abnormal   Collection Time: 09/05/17  9:07 AM  Result Value Ref Range   WBC 6.5 4.0 - 10.5 K/uL   RBC 2.89 (L) 4.22 - 5.81 MIL/uL    Hemoglobin 7.9 (L) 13.0 - 17.0 g/dL   HCT 26.2 (L) 39.0 - 52.0 %   MCV 90.7 78.0 - 100.0 fL   MCH 27.3 26.0 - 34.0 pg   MCHC 30.2 30.0 - 36.0 g/dL   RDW 16.6 (H) 11.5 - 15.5 %   Platelets 204 150 - 400 K/uL    Comment: Performed at [facility], [place] [address] [place], [place] [address]  CBC     Status: Abnormal   Collection Time: 09/06/17  5:28 AM  Result Value Ref Range   WBC 5.9 4.0 - 10.5 K/uL   RBC 2.67 (L) 4.22 - 5.81 MIL/uL   Hemoglobin 7.3 (L) 13.0 - 17.0 g/dL   HCT 24.2 (L) 39.0 - 52.0 %   MCV 90.6 78.0 - 100.0 fL   MCH 27.3 26.0 - 34.0 pg   MCHC 30.2 30.0 - 36.0 g/dL   RDW 16.6 (H) 11.5 - 15.5 %   Platelets 204 150 - 400 K/uL    Comment:  Performed at [facility], [place] [address], [place], [place] [address]  Basic metabolic panel     Status: Abnormal   Collection Time: 09/06/17  5:28 AM  Result Value Ref Range   Sodium 143 135 - 145 mmol/L   Potassium 3.9 3.5 - 5.1 mmol/L   Chloride 117 (H) 98 - 111 mmol/L   CO2 19 (L) 22 - 32 mmol/L   Glucose, Bld 96 70 - 99 mg/dL   BUN 64 (H) 8 - 23 mg/dL   Creatinine, Ser 3.60 (H) 0.61 - 1.24 mg/dL   Calcium 8.4 (L) 8.9 - 10.3 mg/dL   GFR calc non Af Amer 15 (L) >60 mL/min   GFR calc Af Amer 17 (L) >60 mL/min    Comment: (NOTE) The eGFR has been calculated using the CKD EPI equation. This calculation has not been validated in all clinical situations. eGFR's persistently <60 mL/min signify possible Chronic Kidney Disease.    Anion gap 7 5 - 15    Comment: Performed at [facility] [address], [place], [place] [address]     HENT: Normocephalic, atraumatic Eyes: EOMI. No discharge. Cardio: RRR. No JVD. Resp: CTA B/L and unlabored GI: BS and ND Skin: Warm and dry. Intact. Neuro: Alert/Oriented Motor: Motor: Left delt 3-/5, bi, tri 2+/5, grip 3+/5  LLE: 4+/5 proximal 2/5 distally Musc/Skel:  No edema or tenderness in extremities, decreased ROM Left shoulder abd (pt states this is chronic from  "rotary cuff problem") Gen NAD. Vital signs reviewed   Assessment/Plan: 1. 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 Physiatrist is providing close team supervision and 24 hour management of active medical problems listed below. Physiatrist and rehab team continue to assess barriers to discharge/monitor patient progress toward functional and medical goals. FIM: Function - Bathing Bathing activity did not occur: Refused Position: Shower Body parts bathed by patient: Right arm, Left arm, Chest, Abdomen, Front perineal area, Right upper leg, Buttocks, Left upper leg, Right lower leg, Left lower leg Body parts bathed by helper: Right arm, Buttocks, Back Bathing not applicable: Back Assist Level: Touching or steadying assistance(Pt > 75%)  Function- Upper Body Dressing/Undressing What is the patient wearing?: Pull over shirt/dress Pull over shirt/dress - Perfomed by patient: Put head through opening, Pull shirt over trunk, Thread/unthread right sleeve, Thread/unthread left sleeve Pull over shirt/dress - Perfomed by helper: Thread/unthread right sleeve, Thread/unthread left sleeve Button up shirt - Perfomed by patient: Thread/unthread right sleeve, Thread/unthread left sleeve, Pull shirt around back Button up shirt - Perfomed by helper: Button/unbutton shirt Assist Level: Supervision or verbal cues Function - Lower Body Dressing/Undressing What is the patient wearing?: Underwear, Pants, Non-skid slipper socks, Ted Hose Position: Wheelchair/chair at [employer] - Performed by patient: Thread/unthread right underwear leg, Thread/unthread left underwear leg, Pull underwear up/down Pants- Performed by patient: Thread/unthread right pants leg, Thread/unthread left pants leg, Pull pants up/down Pants- Performed by helper: Pull pants up/down Non-skid slipper socks- Performed by  patient: Don/doff right sock, Don/doff left sock Non-skid slipper socks- Performed by helper: Don/doff right sock, Don/doff left sock Socks - Performed by patient: Don/doff right sock Socks - Performed by helper: Don/doff right sock, Don/doff left sock Shoes - Performed by patient: Don/doff right shoe, Don/doff left shoe Shoes - Performed by helper: Fasten right, Fasten left TED Hose - Performed by helper: Don/doff right TED hose, Don/doff left TED hose Assist  for footwear: Partial/moderate assist Assist for lower body dressing: Supervision or verbal cues  Function - Toileting Toileting activity did not occur: No continent bowel/bladder event Toileting steps completed by patient: Adjust clothing prior to toileting, Performs perineal hygiene, Adjust clothing after toileting Toileting steps completed by helper: Adjust clothing after toileting Toileting Assistive Devices: Grab bar or rail, Toilet aid Assist level: Supervision or verbal cues  Function - Toilet Transfers Toilet transfer assistive device: Elevated toilet seat/BSC over toilet Assist level to toilet: Touching or steadying assistance (Pt > 75%) Assist level from toilet: Touching or steadying assistance (Pt > 75%)  Function - Chair/bed transfer Chair/bed transfer method: Stand pivot Chair/bed transfer assist level: Supervision or verbal cues Chair/bed transfer assistive device: Armrests, Walker Chair/bed transfer details: Verbal cues for safe use of DME/AE, Verbal cues for precautions/safety, Verbal cues for technique  Function - Locomotion: Wheelchair Will patient use wheelchair at discharge?: (TBD) Type: Manual Max wheelchair distance: 150' Assist Level: Supervision or verbal cues Assist Level: Supervision or verbal cues Wheel 150 feet activity did not occur: Safety/medical concerns Assist Level: Supervision or verbal cues Turns around,maneuvers to table,bed, and toilet,negotiates 3% grade,maneuvers on rugs and over  doorsills: No Function - Locomotion: Ambulation Assistive device: Walker-rolling Max distance: 10 ft Assist level: Touching or steadying assistance (Pt > 75%) Assist level: Touching or steadying assistance (Pt > 75%) Walk 50 feet with 2 turns activity did not occur: Safety/medical concerns Walk 150 feet activity did not occur: Safety/medical concerns Walk 10 feet on uneven surfaces activity did not occur: Safety/medical concerns  Function - Comprehension Comprehension: Auditory Comprehension assist level: Follows complex conversation/direction with extra time/assistive device  Function - Expression Expression: Verbal Expression assist level: Expresses complex ideas: With extra time/assistive device  Function - Social Interaction Social Interaction assist level: Interacts appropriately 90% of the time - Needs monitoring or encouragement for participation or interaction.  Function - Problem Solving Problem solving assist level: Solves basic problems with no assist  Function - Memory Memory assist level: Recognizes or recalls 90% of the time/requires cueing < 10% of the time Patient normally able to recall (first 3 days only): Current season, Location of own room, Staff names and faces, That he or she is in a hospital  Medical Problem List and Plan: 1.  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 Extend stay to 8/24- should be stable after transfusion   2.  DVT Prophylaxis/Anticoagulation: Pharmaceutical: Lovenox 3. Idiopathic neuropathy/Pain Management: Gabapentin was added for LUE dysesthesias. Continue ultram prn as at home.  4. Mood: LCSW to follow for evaluation and support.  5. Neuropsych: This patient is capable of making decisions on his own behalf. 6. Skin/Wound Care: routine pressure relief measures.  7.  Fluids/Electrolytes/Nutrition: Monitor I/O. D/C IV enc po in preparation for home d/c 8. COPD: Increased albuterol to every 4 hours prn. Continue Onoro. Encourage IS to help with atelectasis 9.  Acute on chronic renal failure--stage IV:  Baseline SCr-3.65. On calcitriol for secondary hyperparathyroidism. GFR stable, BUN a little up. Avoid ARB/ No ACE due to allergy.   Currently at baseline  Encourage fluid intake- 978m yesterday           IVF daily at bedtime started on 8/8 THis may be contributing to anemia 10. H/o gastritis due to NSAIDs/ GERD: Continue PPI.  11. Dyslipidemia: on Lipitor.  12. CAD/AS: Treated  medically with metoprolol, ASA and lipitor.  13. Peri anal cyst:  With acute on chronic inflammation. No prior treatment per reports. Started on Augmentin on 8/3 for  7-10 days treatment. Continue moist warm compresses to help drain.    14. ABLA on Anemia of chronic disease: Hgb- 11.2-12.5 range. Continue to monitor.     Stool OB pos,is on Lovenox,  at this time will not change ASA Plavix, appreciate GI- rec EGD but they note pt's preference, please see note.  Have suggested CT abd/pelvis to R/o retroperitoneal bleed, will order.   Will also transfuse 2 U PRBC slowly to guard against demand ischemia if Hgb drops lower after D/C 15. Constipation: Scheduled Senna S 2 at bedtime. 16.  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 Multifocal neuropathy, hx of sciatica, may benefit from outpt EMG to further evaluate 17. Blood pressure- off antihypertensive meds Vitals:   09/05/17 2053 09/06/17 0540  BP:  123/72  Pulse:  82  Resp:  20  Temp:  97.8 F (36.6 C)  SpO2: 99% 97%   18. Urinary frequency  I: 585m    PVRs ordered  Condom cath qhs until PVRs/IVF 19.  Chronic left shoulder contrature, incomplete abd pt states he wore some type of brace ? Cuff sling will discuss with OT  LOS (Days) 18 A FACE TO FACE EVALUATION WAS  PERFORMED  ACharlett Blake8/23/2019, 7:51 AM

## 2017-09-06 NOTE — Progress Notes (Signed)
          Daily Rounding Note  09/06/2017, 8:17 AM  LOS: 18 days   SUBJECTIVE:   Feels well, no SOB or CP   No BM's today.  No abd pain.   Confirms he does not want EGD  OBJECTIVE:         Vital signs in last 24 hours:    Temp:  [97.8 F (36.6 C)-98.6 F (37 C)] 97.8 F (36.6 C) (08/23 0540) Pulse Rate:  [82-93] 82 (08/23 0540) Resp:  [18-20] 20 (08/23 0540) BP: (108-123)/(71-74) 123/72 (08/23 0540) SpO2:  [96 %-99 %] 97 % (08/23 0759) Last BM Date: 09/05/17 Filed Weights   08/19/17 1512 08/28/17 0508  Weight: 93.1 kg 93.2 kg   General: looks old, comfortable, not acutely ill.  Alert, NAD   Chest: no labored breathing Abdomen: ND  Extremities: no CCE Neuro/Psych:  Oriented x 3, no memory lapse, speech fluid, moving all limbs  Intake/Output from previous day: 08/22 0701 - 08/23 0700 In: 720 [P.O.:720] Out: 925 [Urine:925]  Intake/Output this shift: No intake/output data recorded.  Lab Results: Recent Labs    09/05/17 0502 09/05/17 0907 09/06/17 0528  WBC 5.9 6.5 5.9  HGB 7.7* 7.9* 7.3*  HCT 25.1* 26.2* 24.2*  PLT 219 204 204   BMET Recent Labs    09/04/17 0559 09/06/17 0528  NA 145 143  K 4.5 3.9  CL 119* 117*  CO2 20* 19*  GLUCOSE 100* 96  BUN 68* 64*  CREATININE 3.85* 3.60*  CALCIUM 8.4* 8.4*     ASSESMENT:   *   Dark FOBT + and progressive blood loss and chronic dz anemia in setting of new Plavix and higher dose ASA for new CVA  *  CAD, inoperable per prior pulmonary assessment.       PLAN   *  CT abdomen/pelvis to r/o RP bleed.   *  Agree with plan to transfuse 2 U PRBCs to prevent demand ischemia.    *  Will add anemia studies on to labs.    *   GI will sign off.  Call back for questions or reinvolvement.       Azucena Freed  09/06/2017, 8:17 AM Phone [phone]

## 2017-09-06 NOTE — Progress Notes (Signed)
Occupational Therapy Discharge Summary  Patient Details  Name: [name] MRN: [id number] Date of Birth: 03/25/1935   Patient has met 12 of 12 long term goals due to improved activity tolerance, improved balance, postural control, ability to compensate for deficits, functional use of  LEFT upper and LEFT lower extremity, improved attention, improved awareness and improved coordination.  Pt made steady progress with BADLs and functional transfers during this admission.  Pt completes all bathing/dressing tasks at supervision level.  Pt performs toilet transfers to [facility] over toilet and toileting tasks with supervision.  Pt uses his LUE at diminished level but has limited shoulder AROM/PROM secondary to rotator cuff injury.  Pt's wife has been present for therapy and provides the appropriate level of supervision.  Pt and family have verbalized understanding of recommendation for 24 hour supervision.  Patient to discharge at overall Supervision level.  Patient's care partner is independent to provide the necessary physical and cognitive assistance at discharge.      Recommendation:  Patient will benefit from ongoing skilled OT services in home health setting to continue to advance functional skills in the area of BADL and iADL.  Equipment: BSC  Reasons for discharge: treatment goals met  Patient/family agrees with progress made and goals achieved: Yes  OT Discharge Precautions/Restrictions    General   Vital Signs Therapy Vitals Temp: 97.8 F (36.6 C) Pulse Rate: 82 Resp: 20 BP: 123/72 Patient Position (if appropriate): Lying Oxygen Therapy SpO2: 97 % O2 Device: Room Air Pain   ADL   Vision Baseline Vision/History: Wears glasses Wears Glasses: At all times Patient Visual Report: No change from baseline Vision Assessment?: No apparent visual deficits Perception  Perception: Within Functional Limits Praxis Praxis: Intact Cognition Overall Cognitive Status: Within Functional  Limits for tasks assessed Arousal/Alertness: Awake/alert Orientation Level: Oriented X4 Attention: Selective Selective Attention: Appears intact Memory: Appears intact Awareness: Appears intact Awareness Impairment: Emergent impairment;Anticipatory impairment Problem Solving: Appears intact Safety/Judgment: Appears intact Sensation Sensation Light Touch: Appears Intact Hot/Cold: Appears Intact Proprioception: Appears Intact Stereognosis: Not tested Coordination Gross Motor Movements are Fluid and Coordinated: No Fine Motor Movements are Fluid and Coordinated: No Coordination and Movement Description: LUE with decreased coordination Finger Nose Finger Test: slow  Motor  Motor Motor: Hemiplegia Motor - Skilled Clinical Observations: L hemi, generalized weakness     Trunk/Postural Assessment  Cervical Assessment Cervical Assessment: Within Functional Limits Thoracic Assessment Thoracic Assessment: Within Functional Limits Lumbar Assessment Lumbar Assessment: Within Functional Limits Postural Control Righting Reactions: delayed  Balance Static Sitting Balance Static Sitting - Balance Support: No upper extremity supported;Feet supported Static Sitting - Level of Assistance: 6: Modified independent (Device/Increase time) Dynamic Sitting Balance Dynamic Sitting - Balance Support: No upper extremity supported;Feet supported Dynamic Sitting - Level of Assistance: 5: Stand by assistance Extremity/Trunk Assessment RUE Assessment RUE Assessment: Within Functional Limits LUE Assessment LUE Assessment: Exceptions to [facility] Passive Range of Motion (PROM) Comments: limited PROM at 90 degreessecondary to rotator cuff injury Active Range of Motion (AROM) Comments: limited AROM at 90 degreessecondary to rotator cuff injury General Strength Comments: 2-/5 shoulder, 4/5 elbow, wrist, and digits LUE Body System: Neuro Brunstrum levels for arm and hand: Arm;Hand Brunstrum level for arm:  Stage V Relative Independence from Synergy Brunstrum level for hand: Stage VI Isolated joint movements   See Function Navigator for Current Functional Status.  Leotis Shames [facility] 09/06/2017, 8:23 AM

## 2017-09-06 NOTE — Progress Notes (Signed)
Occupational Therapy Session Note  Patient Details  Name: [name] MRN: [id number] Date of Birth: 1935-04-12  Today's Date: 09/06/2017 OT Individual Time: 0700-0826 OT Individual Time Calculation (min): 86 min    Short Term Goals: Week 2:  OT Short Term Goal 1 (Week 2): STG=LTG secondary to ELOS  Skilled Therapeutic Interventions/Progress Updates:    Pt asleep in bed upon arrival but easily aroused.  OT intervention with focus on bed mobility, functional transfers, sit<>stand, standing balance, BADL retraining, activity tolerance, and safety awareness to increase independence with BADLs. Pt completed all functional transfers and bathing/dressing at supervision level.  Pt requires more than a reasonable amount of time to complete tasks with multiple rest breaks secondary to SOB. Pt uses his LUE at diminished level and is able to open all containers for meals. Pt is pleased with his progress and ready for discharge tomorrow.   Therapy Documentation Precautions:  Precautions Precautions: Fall Restrictions Weight Bearing Restrictions: No Pain:  Pt denies pain  See Function Navigator for Current Functional Status.   Therapy/Group: Individual Therapy  Leroy Libman 09/06/2017, 8:27 AM

## 2017-09-06 NOTE — Progress Notes (Signed)
IV TEAM: Consult received, arrived to room- pt is out with PT. Unavailable for IV start until 10.

## 2017-09-07 LAB — TYPE AND SCREEN
ABO/RH(D): B POS
Antibody Screen: NEGATIVE
UNIT DIVISION: 0
UNIT DIVISION: 0

## 2017-09-07 LAB — ABO/RH: ABO/RH(D): B POS

## 2017-09-07 LAB — BPAM RBC
BLOOD PRODUCT EXPIRATION DATE: 201908302359
Blood Product Expiration Date: 201909272359
ISSUE DATE / TIME: 201908231049
ISSUE DATE / TIME: 201908231314
UNIT TYPE AND RH: 7300
Unit Type and Rh: 7300

## 2017-09-07 MED ORDER — PANTOPRAZOLE SODIUM 40 MG PO TBEC
40.0000 mg | DELAYED_RELEASE_TABLET | Freq: Two times a day (BID) | ORAL | Status: DC
  Administered 2017-09-07: 40 mg via ORAL
  Filled 2017-09-07: qty 1

## 2017-09-07 MED ORDER — ALBUTEROL SULFATE (2.5 MG/3ML) 0.083% IN NEBU: 3.0000 mL | INHALATION_SOLUTION | RESPIRATORY_TRACT | Status: DC | PRN

## 2017-09-07 NOTE — Progress Notes (Signed)
Subjective/Complaints: Patient seen lying in bed this morning. He states he slept well overnight. He is ready for discharge.  ROS:  Denies CP, SOB, N/V/D  Objective: Vital Signs: Blood pressure 127/81, pulse (!) 101, temperature 98 F (36.7 C), temperature source Oral, resp. rate 20, height _0  (1.753 m), weight 93.2 kg, SpO2 96 %. Ct Abdomen Pelvis Wo Contrast  Result Date: 09/06/2017 CLINICAL DATA:  Unexplained anemia.  Asymptomatic. EXAM: CT ABDOMEN AND PELVIS WITHOUT CONTRAST TECHNIQUE: Multidetector CT imaging of the abdomen and pelvis was performed following the standard protocol without IV contrast. COMPARISON:  02/07/2011 FINDINGS: Lower chest: Normal heart size without pericardial effusion. Subsegmental atelectasis at each lung base. No effusion or pneumothorax. Hepatobiliary: Subtle surface nodularity of the liver may represent morphologic changes of cirrhosis. Given limitations of a noncontrast CT, no space-occupying mass of the liver is apparent. No biliary dilatation. Gallbladder is decompressed without stones. Pancreas: Normal Spleen: No splenomegaly Adrenals/Urinary Tract: Normal bilateral adrenal glands. Redemonstration of bilateral renal cysts likely representing stigmata of polycystic renal disease. No definite solid lesions identified though study is limited by lack of IV contrast. No nephrolithiasis nor hydroureteronephrosis. The urinary bladder is unremarkable. Stomach/Bowel: Moderate distention of the stomach with ingested food. The duodenal sweep and ligament of Treitz are normal. No small bowel obstruction or inflammation. The distal and terminal ileum are unremarkable. Stool retention is seen throughout the colon with descending and sigmoid colonic diverticulosis identified. No large bowel obstruction or definite annular constricting lesions. Normal-appearing appendix. Vascular/Lymphatic: Moderate aortoiliac atherosclerosis without aneurysm. No lymphadenopathy. Reproductive:  Enlarged prostate measuring approximately 6.6 cm diameter. This impresses upon the base of the bladder. Other: No free air nor free fluid. Musculoskeletal: Grade 1 anterolisthesis of L4 on L5 likely on the basis of degenerative disc and facet arthropathy. More severe disc flattening at L5-S1 with facet arthropathy. No aggressive osseous lesions. IMPRESSION: 1. Surface nodularity of the liver which may represent morphologic changes of cirrhosis. 2. Redemonstration of multiple bilateral renal cysts likely representing polycystic renal disease. 3. Enlarged prostate. 4. Descending and sigmoid colonic diverticulosis without acute diverticulitis. 5. Lower lumbar degenerative disc and facet arthropathy. Grade 1 anterolisthesis of L4 on L5. Electronically Signed   By: Ashley Royalty M.D.   On: 09/06/2017 20:12   Results for orders placed or performed during the hospital encounter of 08/19/17 (from the past 72 hour(s))  CBC     Status: Abnormal   Collection Time: 09/05/17  5:02 AM  Result Value Ref Range   WBC 5.9 4.0 - 10.5 K/uL   RBC 2.77 (L) 4.22 - 5.81 MIL/uL   Hemoglobin 7.7 (L) 13.0 - 17.0 g/dL   HCT 25.1 (L) 39.0 - 52.0 %   MCV 90.6 78.0 - 100.0 fL   MCH 27.8 26.0 - 34.0 pg   MCHC 30.7 30.0 - 36.0 g/dL   RDW 16.4 (H) 11.5 - 15.5 %   Platelets 219 150 - 400 K/uL    Comment: Performed at [facility], [place] [address] [place] [address]  CBC     Status: Abnormal   Collection Time: 09/05/17  9:07 AM  Result Value Ref Range   WBC 6.5 4.0 - 10.5 K/uL   RBC 2.89 (L) 4.22 - 5.81 MIL/uL   Hemoglobin 7.9 (L) 13.0 - 17.0 g/dL   HCT 26.2 (L) 39.0 - 52.0 %   MCV 90.7 78.0 - 100.0 fL   MCH 27.3 26.0 - 34.0 pg   MCHC 30.2 30.0 - 36.0  g/dL   RDW 16.6 (H) 11.5 - 15.5 %   Platelets 204 150 - 400 K/uL    Comment: Performed at [facility], [place] [address], [place], [place] [address]  CBC     Status: Abnormal   Collection Time: 09/06/17  5:28 AM  Result Value Ref Range   WBC 5.9 4.0 - 10.5  K/uL   RBC 2.67 (L) 4.22 - 5.81 MIL/uL   Hemoglobin 7.3 (L) 13.0 - 17.0 g/dL   HCT 24.2 (L) 39.0 - 52.0 %   MCV 90.6 78.0 - 100.0 fL   MCH 27.3 26.0 - 34.0 pg   MCHC 30.2 30.0 - 36.0 g/dL   RDW 16.6 (H) 11.5 - 15.5 %   Platelets 204 150 - 400 K/uL    Comment: Performed at [facility], [place] [address] [place], [place] [address]  Basic metabolic panel     Status: Abnormal   Collection Time: 09/06/17  5:28 AM  Result Value Ref Range   Sodium 143 135 - 145 mmol/L   Potassium 3.9 3.5 - 5.1 mmol/L   Chloride 117 (H) 98 - 111 mmol/L   CO2 19 (L) 22 - 32 mmol/L   Glucose, Bld 96 70 - 99 mg/dL   BUN 64 (H) 8 - 23 mg/dL   Creatinine, Ser 3.60 (H) 0.61 - 1.24 mg/dL   Calcium 8.4 (L) 8.9 - 10.3 mg/dL   GFR calc non Af Amer 15 (L) >60 mL/min   GFR calc Af Amer 17 (L) >60 mL/min    Comment: (NOTE) The eGFR has been calculated using the CKD EPI equation. This calculation has not been validated in all clinical situations. eGFR's persistently <60 mL/min signify possible Chronic Kidney Disease.    Anion gap 7 5 - 15    Comment: Performed at [facility] [address] [place], [place] [address]  Type and screen [place]     Status: None   Collection Time: 09/06/17  8:15 AM  Result Value Ref Range   ABO/RH(D) B POS    Antibody Screen NEG    Sample Expiration 09/09/2017    Unit Number [id number]    Blood Component Type RED CELLS,LR    Unit division 00    Status of Unit ISSUED,FINAL    Transfusion Status OK TO TRANSFUSE    Crossmatch Result      Compatible Performed at [facility], [place] [address] [place], [place] [address]    Unit Number [id number]    Blood Component Type RED CELLS,LR    Unit division 00    Status of Unit ISSUED,FINAL    Transfusion Status OK TO TRANSFUSE    Crossmatch Result Compatible   Prepare RBC     Status: None   Collection Time: 09/06/17  8:15 AM  Result Value Ref Range   Order Confirmation      ORDER  PROCESSED BY BLOOD BANK Performed at [facility], [place] [address] [place], [place] [address]   ABO/Rh     Status: None   Collection Time: 09/06/17  8:15 AM  Result Value Ref Range   ABO/RH(D)      B POS Performed at [facility] [address] [place], [place] [address]   Vitamin B12     Status: None   Collection Time: 09/06/17  8:44 AM  Result Value Ref Range   Vitamin B-12 319 180 - 914 pg/mL  Comment: (NOTE) This assay is not validated for testing neonatal or myeloproliferative syndrome specimens for Vitamin B12 levels. Performed at [facility], [place] [address], [place], [place] [address]   Iron and TIBC     Status: Abnormal   Collection Time: 09/06/17  8:44 AM  Result Value Ref Range   Iron 40 (L) 45 - 182 ug/dL   TIBC 342 250 - 450 ug/dL   Saturation Ratios 12 (L) 17.9 - 39.5 %   UIBC 302 ug/dL    Comment: Performed at [facility], [place] [address], [place], [place] [address]  Ferritin     Status: Abnormal   Collection Time: 09/06/17  8:44 AM  Result Value Ref Range   Ferritin 3 (L) 24 - 336 ng/mL    Comment: Performed at [facility], [place] [address], [place], [place] [address]  Reticulocytes     Status: Abnormal   Collection Time: 09/06/17  8:44 AM  Result Value Ref Range   Retic Ct Pct 3.0 0.4 - 3.1 %   RBC. 2.66 (L) 4.22 - 5.81 MIL/uL   Retic Count, Absolute 79.8 19.0 - 186.0 K/uL    Comment: Performed at [facility] [address], [place], [place] [address]  Folate     Status: None   Collection Time: 09/06/17  8:44 AM  Result Value Ref Range   Folate 21.8 >5.9 ng/mL    Comment: Performed at [facility] [address], [place], [place] [address]  Hemoglobin and hematocrit, blood     Status: Abnormal   Collection Time: 09/06/17  8:19 PM  Result Value Ref Range   Hemoglobin 9.4 (L) 13.0 - 17.0 g/dL    Comment: REPEATED TO VERIFY POST TRANSFUSION SPECIMEN    HCT 30.4 (L) 39.0 - 52.0 %    Comment: Performed at  [facility] [address] [place], [place] [address]     Constitutional: No distress . Vital signs reviewed. HENT: Normocephalic.  Atraumatic. Eyes: EOMI. No discharge. Cardiovascular: RRR. No JVD. Respiratory: CTA Bilaterally. Normal effort. GI: BS +. Non-distended. Musc: No edema or tenderness in extremities. Motor: Motor: Left delt 3/5, bi, tri 3/5, grip 3/5  LLE: 4/5 proximal 2/5 distally Musc/Skel:  No edema or tenderness in extremities Gen NAD. Vital signs reviewed   Assessment/Plan: 1. 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 Physiatrist is providing close team supervision and 24 hour management of active medical problems listed below. Physiatrist and rehab team continue to assess barriers to discharge/monitor patient progress toward functional and medical goals. FIM: Function - Bathing Bathing activity did not occur: Refused Position: Shower Body parts bathed by patient: Right arm, Left arm, Chest, Abdomen, Front perineal area, Right upper leg, Buttocks, Left upper leg, Right lower leg, Left lower leg Body parts bathed by helper: Right arm, Buttocks, Back Bathing not applicable: Back Assist Level: Supervision or verbal cues  Function- Upper Body Dressing/Undressing What is the patient wearing?: Pull over shirt/dress Pull over shirt/dress - Perfomed by patient: Put head through opening, Pull shirt over trunk, Thread/unthread right sleeve, Thread/unthread left sleeve Pull over shirt/dress - Perfomed by helper: Thread/unthread right sleeve, Thread/unthread left sleeve Button up shirt - Perfomed by patient: Thread/unthread right sleeve, Thread/unthread left sleeve, Pull shirt around back Button up shirt - Perfomed by helper: Button/unbutton shirt Assist Level: Supervision or verbal cues Function -  Lower Body Dressing/Undressing What is the  patient wearing?: Underwear, Pants, [employer], Shoes, Socks Position: Education officer, museum at [employer] - Performed by patient: Thread/unthread right underwear leg, Thread/unthread left underwear leg, Pull underwear up/down Pants- Performed by patient: Thread/unthread right pants leg, Thread/unthread left pants leg, Pull pants up/down Pants- Performed by helper: Pull pants up/down Non-skid slipper socks- Performed by patient: Don/doff right sock, Don/doff left sock Non-skid slipper socks- Performed by helper: Don/doff right sock, Don/doff left sock Socks - Performed by patient: Don/doff right sock, Don/doff left sock Socks - Performed by helper: Don/doff right sock, Don/doff left sock Shoes - Performed by patient: Don/doff right shoe, Don/doff left shoe(elastic laces) Shoes - Performed by helper: Fasten right, Fasten left TED Hose - Performed by helper: Don/doff right TED hose, Don/doff left TED hose Assist for footwear: Partial/moderate assist Assist for lower body dressing: Supervision or verbal cues  Function - Toileting Toileting activity did not occur: No continent bowel/bladder event Toileting steps completed by patient: Adjust clothing prior to toileting, Performs perineal hygiene, Adjust clothing after toileting Toileting steps completed by helper: Adjust clothing after toileting Toileting Assistive Devices: Grab bar or rail, Toilet aid Assist level: Supervision or verbal cues  Function - Air cabin crew transfer assistive device: Elevated toilet seat/BSC over toilet Assist level to toilet: Supervision or verbal cues Assist level from toilet: Supervision or verbal cues  Function - Chair/bed transfer Chair/bed transfer method: Stand pivot, Ambulatory Chair/bed transfer assist level: Supervision or verbal cues Chair/bed transfer assistive device: Armrests, Walker Chair/bed transfer details: Verbal cues for safe use of DME/AE, Verbal cues for precautions/safety  Function  - Locomotion: Wheelchair Will patient use wheelchair at discharge?: Yes Type: Manual Max wheelchair distance: 150' Assist Level: Supervision or verbal cues Assist Level: Supervision or verbal cues Wheel 150 feet activity did not occur: Safety/medical concerns Assist Level: Supervision or verbal cues Turns around,maneuvers to table,bed, and toilet,negotiates 3% grade,maneuvers on rugs and over doorsills: No Function - Locomotion: Ambulation Assistive device: Walker-rolling Max distance: 25' Assist level: Touching or steadying assistance (Pt > 75%) Assist level: Touching or steadying assistance (Pt > 75%) Walk 50 feet with 2 turns activity did not occur: Safety/medical concerns Walk 150 feet activity did not occur: Safety/medical concerns Walk 10 feet on uneven surfaces activity did not occur: Safety/medical concerns  Function - Comprehension Comprehension: Auditory Comprehension assist level: Understands complex 90% of the time/cues 10% of the time  Function - Expression Expression: Verbal Expression assist level: Expresses complex 90% of the time/cues < 10% of the time  Function - Social Interaction Social Interaction assist level: Interacts appropriately 90% of the time - Needs monitoring or encouragement for participation or interaction.  Function - Problem Solving Problem solving assist level: Solves basic problems with no assist  Function - Memory Memory assist level: Recognizes or recalls 90% of the time/requires cueing < 10% of the time Patient normally able to recall (first 3 days only): Current season, Location of own room, Staff names and faces, That he or she is in a hospital  Medical Problem List and Plan: 1.  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 Extend stay to 8/24  CT abd/pelvis reviewed, no  evidence of bleeding 2.  DVT Prophylaxis/Anticoagulation: Pharmaceutical: Lovenox 3. Idiopathic neuropathy/Pain Management: Gabapentin was added for LUE dysesthesias. Continue ultram prn as at home.  4. Mood: LCSW to follow  for evaluation and support.  5. Neuropsych: This patient is capable of making decisions on his own behalf. 6. Skin/Wound Care: routine pressure relief measures.  7. Fluids/Electrolytes/Nutrition: Monitor I/O. D/Ced IV enc po  8. COPD: Increased albuterol to every 4 hours prn. Continue Onoro. Encourage IS to help with atelectasis 9.  Acute on chronic renal failure--stage IV:  Baseline SCr-3.65. On calcitriol for secondary hyperparathyroidism. Avoid ARB/ No ACE due to allergy.   Currently at baseline  Encourage fluid intake 10. H/o gastritis due to NSAIDs/ GERD: Continue PPI.  11. Dyslipidemia: on Lipitor.  12. CAD/AS: Treated medically with metoprolol, ASA and lipitor.  13. Peri anal cyst:  With acute on chronic inflammation. No prior treatment per reports. Started on Augmentin on 8/3 for  7-10 days treatment. Continue moist warm compresses to help drain.    14. ABLA on Anemia of chronic disease:   Hgb- 11.2-12.5 range. Continue to monitor.   Stool OB pos,is on Lovenox,  at this time will not change ASA Plavix, appreciate GI- rec EGD but they note pt's preference, please see note.  CT abd/pelvis neg for retroperitoneal bleed  Transfused 2 U PRBC slowly to guard against demand ischemia if Hgb drops lower after D/C 15. Constipation: Scheduled Senna S 2 at bedtime. 16.  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 Multifocal neuropathy, hx of sciatica, may benefit from outpt EMG to further evaluate 17. Blood pressure- off antihypertensive meds Vitals:   09/07/17 0502 09/07/17 0845  BP: 127/81   Pulse: (!) 101   Resp: 20   Temp:    SpO2: 96% 96%   18. Urinary frequency  ?Improving 19.  Chronic left shoulder  contrature,   May need outpt follow up  LOS (Days) 19 A FACE TO FACE EVALUATION WAS PERFORMED  Kaitlynne Wenz Lorie Phenix 09/07/2017, 10:41 AM

## 2017-09-07 NOTE — Progress Notes (Signed)
Uneventful night. No problems or C/O, post transfusion. Mostly complains of weak grip to left hand. LBM 08/23. [name] A.

## 2017-09-07 NOTE — Progress Notes (Signed)
Patient was discharged with wife and all discharge instructions via Dan at 1140. Reviewed all discharge instructions this morning with patient. IV removed by NT. All patient's questions answered. This nurse off floor with patient and family adamant that they needed to leave. Patient and family did not wait for NT assistance.

## 2017-09-09 LAB — H. PYLORI ANTIBODY, IGG: H Pylori IgG: <0.8 Index Value (ref 0.00–0.79)

## 2017-10-01 ENCOUNTER — Inpatient Hospital Stay: Payer: Medicare Other | Admitting: Physical Medicine & Rehabilitation

## 2017-10-08 ENCOUNTER — Ambulatory Visit: Payer: Medicare Other | Admitting: Adult Health

## 2017-12-04 ENCOUNTER — Emergency Department: Payer: Medicare Other

## 2017-12-04 ENCOUNTER — Encounter: Payer: Self-pay | Admitting: Emergency Medicine

## 2017-12-04 ENCOUNTER — Observation Stay
Admission: EM | Admit: 2017-12-04 | Discharge: 2017-12-06 | Disposition: A | Discharge status: Home / Self Care | Payer: Medicare Other | Attending: Internal Medicine | Admitting: Internal Medicine

## 2017-12-04 ENCOUNTER — Other Ambulatory Visit: Payer: Self-pay

## 2017-12-04 DIAGNOSIS — I651 Occlusion and stenosis of basilar artery: Secondary | ICD-10-CM | POA: Diagnosis not present

## 2017-12-04 DIAGNOSIS — D631 Anemia in chronic kidney disease: Secondary | ICD-10-CM | POA: Diagnosis not present

## 2017-12-04 DIAGNOSIS — Z7902 Long term (current) use of antithrombotics/antiplatelets: Secondary | ICD-10-CM | POA: Diagnosis not present

## 2017-12-04 DIAGNOSIS — I129 Hypertensive chronic kidney disease with stage 1 through stage 4 chronic kidney disease, or unspecified chronic kidney disease: Secondary | ICD-10-CM | POA: Insufficient documentation

## 2017-12-04 DIAGNOSIS — R531 Weakness: Secondary | ICD-10-CM | POA: Diagnosis not present

## 2017-12-04 DIAGNOSIS — N184 Chronic kidney disease, stage 4 (severe): Secondary | ICD-10-CM | POA: Insufficient documentation

## 2017-12-04 DIAGNOSIS — J449 Chronic obstructive pulmonary disease, unspecified: Secondary | ICD-10-CM | POA: Insufficient documentation

## 2017-12-04 DIAGNOSIS — N400 Enlarged prostate without lower urinary tract symptoms: Secondary | ICD-10-CM | POA: Diagnosis not present

## 2017-12-04 DIAGNOSIS — Z886 Allergy status to analgesic agent status: Secondary | ICD-10-CM | POA: Insufficient documentation

## 2017-12-04 DIAGNOSIS — D62 Acute posthemorrhagic anemia: Principal | ICD-10-CM | POA: Insufficient documentation

## 2017-12-04 DIAGNOSIS — Z888 Allergy status to other drugs, medicaments and biological substances status: Secondary | ICD-10-CM | POA: Diagnosis not present

## 2017-12-04 DIAGNOSIS — G609 Hereditary and idiopathic neuropathy, unspecified: Secondary | ICD-10-CM | POA: Diagnosis not present

## 2017-12-04 DIAGNOSIS — D649 Anemia, unspecified: Secondary | ICD-10-CM

## 2017-12-04 DIAGNOSIS — R2681 Unsteadiness on feet: Secondary | ICD-10-CM | POA: Insufficient documentation

## 2017-12-04 DIAGNOSIS — I350 Nonrheumatic aortic (valve) stenosis: Secondary | ICD-10-CM | POA: Insufficient documentation

## 2017-12-04 DIAGNOSIS — Z66 Do not resuscitate: Secondary | ICD-10-CM | POA: Insufficient documentation

## 2017-12-04 DIAGNOSIS — K922 Gastrointestinal hemorrhage, unspecified: Secondary | ICD-10-CM

## 2017-12-04 DIAGNOSIS — R778 Other specified abnormalities of plasma proteins: Secondary | ICD-10-CM

## 2017-12-04 DIAGNOSIS — Z8673 Personal history of transient ischemic attack (TIA), and cerebral infarction without residual deficits: Secondary | ICD-10-CM | POA: Insufficient documentation

## 2017-12-04 DIAGNOSIS — I2510 Atherosclerotic heart disease of native coronary artery without angina pectoris: Secondary | ICD-10-CM | POA: Insufficient documentation

## 2017-12-04 DIAGNOSIS — I712 Thoracic aortic aneurysm, without rupture: Secondary | ICD-10-CM | POA: Insufficient documentation

## 2017-12-04 DIAGNOSIS — Z79899 Other long term (current) drug therapy: Secondary | ICD-10-CM | POA: Diagnosis not present

## 2017-12-04 DIAGNOSIS — R7989 Other specified abnormal findings of blood chemistry: Secondary | ICD-10-CM | POA: Insufficient documentation

## 2017-12-04 DIAGNOSIS — Z7982 Long term (current) use of aspirin: Secondary | ICD-10-CM | POA: Diagnosis not present

## 2017-12-04 HISTORY — DX: Cerebral infarction, unspecified: I63.9

## 2017-12-04 LAB — COMPREHENSIVE METABOLIC PANEL
ALBUMIN: 3.7 g/dL (ref 3.5–5.0)
ALT: 10 U/L (ref 0–44)
AST: 12 U/L — AB (ref 15–41)
Alkaline Phosphatase: 87 U/L (ref 38–126)
Anion gap: 12 (ref 5–15)
BILIRUBIN TOTAL: 0.5 mg/dL (ref 0.3–1.2)
BUN: 31 mg/dL — AB (ref 8–23)
CHLORIDE: 113 mmol/L — AB (ref 98–111)
CO2: 18 mmol/L — ABNORMAL LOW (ref 22–32)
Calcium: 8.4 mg/dL — ABNORMAL LOW (ref 8.9–10.3)
Creatinine, Ser: 3.38 mg/dL — ABNORMAL HIGH (ref 0.61–1.24)
GFR calc Af Amer: 18 mL/min — ABNORMAL LOW (ref >60)
GFR calc non Af Amer: 16 mL/min — ABNORMAL LOW (ref >60)
GLUCOSE: 112 mg/dL — AB (ref 70–99)
POTASSIUM: 3.8 mmol/L (ref 3.5–5.1)
Sodium: 143 mmol/L (ref 135–145)
TOTAL PROTEIN: 6.9 g/dL (ref 6.5–8.1)

## 2017-12-04 LAB — PROTIME-INR
INR: 1.14
Prothrombin Time: 14.5 seconds (ref 11.4–15.2)

## 2017-12-04 LAB — CBC
HEMATOCRIT: 20.8 % — AB (ref 39.0–52.0)
HEMOGLOBIN: 6.1 g/dL — AB (ref 13.0–17.0)
MCH: 25.3 pg — AB (ref 26.0–34.0)
MCHC: 29.3 g/dL — ABNORMAL LOW (ref 30.0–36.0)
MCV: 86.3 fL (ref 80.0–100.0)
Platelets: 240 10*3/uL (ref 150–400)
RBC: 2.41 MIL/uL — ABNORMAL LOW (ref 4.22–5.81)
RDW: 15.1 % (ref 11.5–15.5)
WBC: 5.9 10*3/uL (ref 4.0–10.5)
nRBC: 0 % (ref 0.0–0.2)

## 2017-12-04 LAB — PREPARE RBC (CROSSMATCH)

## 2017-12-04 LAB — APTT: aPTT: 28 seconds (ref 24–36)

## 2017-12-04 LAB — TROPONIN I: TROPONIN I: 0.15 ng/mL — AB (ref <0.03)

## 2017-12-04 MED ORDER — CITALOPRAM HYDROBROMIDE 20 MG PO TABS
30.0000 mg | ORAL_TABLET | Freq: Every day | ORAL | Status: DC
  Administered 2017-12-05 – 2017-12-06 (×2): 30 mg via ORAL
  Filled 2017-12-04: qty 1.5
  Filled 2017-12-04: qty 2

## 2017-12-04 MED ORDER — ACETAMINOPHEN 325 MG PO TABS: 325.0000 mg | ORAL_TABLET | ORAL | Status: DC | PRN

## 2017-12-04 MED ORDER — POLYVINYL ALCOHOL 1.4 % OP SOLN
1.0000 [drp] | Freq: Four times a day (QID) | OPHTHALMIC | Status: DC | PRN
  Administered 2017-12-05: 1 [drp] via OPHTHALMIC
  Filled 2017-12-04 (×4): qty 15

## 2017-12-04 MED ORDER — SODIUM CHLORIDE 0.9 % IV SOLN
10.0000 mL/h | Freq: Once | INTRAVENOUS | Status: AC
  Administered 2017-12-05: 10 mL/h via INTRAVENOUS

## 2017-12-04 MED ORDER — INFLUENZA VAC SPLIT HIGH-DOSE 0.5 ML IM SUSY
0.5000 mL | PREFILLED_SYRINGE | INTRAMUSCULAR | Status: DC
  Filled 2017-12-04: qty 0.5

## 2017-12-04 MED ORDER — CALCITRIOL 0.25 MCG PO CAPS
0.2500 ug | ORAL_CAPSULE | ORAL | Status: DC
  Administered 2017-12-06: 0.25 ug via ORAL
  Filled 2017-12-04: qty 1

## 2017-12-04 MED ORDER — UMECLIDINIUM-VILANTEROL 62.5-25 MCG/INH IN AEPB
1.0000 | INHALATION_SPRAY | Freq: Every day | RESPIRATORY_TRACT | Status: DC
  Administered 2017-12-05 – 2017-12-06 (×2): 1 via RESPIRATORY_TRACT
  Filled 2017-12-04: qty 14

## 2017-12-04 MED ORDER — SALINE SPRAY 0.65 % NA SOLN
1.0000 | NASAL | Status: DC | PRN
  Filled 2017-12-04: qty 44

## 2017-12-04 MED ORDER — TRAMADOL HCL 50 MG PO TABS
50.0000 mg | ORAL_TABLET | Freq: Four times a day (QID) | ORAL | Status: DC | PRN
  Administered 2017-12-05: 50 mg via ORAL
  Filled 2017-12-04: qty 1

## 2017-12-04 MED ORDER — ATORVASTATIN CALCIUM 20 MG PO TABS
40.0000 mg | ORAL_TABLET | Freq: Every day | ORAL | Status: DC
  Administered 2017-12-04 – 2017-12-05 (×2): 40 mg via ORAL
  Filled 2017-12-04 (×2): qty 2

## 2017-12-04 MED ORDER — CLOPIDOGREL BISULFATE 75 MG PO TABS
75.0000 mg | ORAL_TABLET | Freq: Every day | ORAL | Status: DC
  Administered 2017-12-05 – 2017-12-06 (×2): 75 mg via ORAL
  Filled 2017-12-04 (×2): qty 1

## 2017-12-04 MED ORDER — VITAMIN C 500 MG PO TABS
250.0000 mg | ORAL_TABLET | Freq: Every day | ORAL | Status: DC
  Administered 2017-12-05 – 2017-12-06 (×2): 250 mg via ORAL
  Filled 2017-12-04: qty 1
  Filled 2017-12-04: qty 0.5

## 2017-12-04 MED ORDER — ALPRAZOLAM 0.5 MG PO TABS
0.5000 mg | ORAL_TABLET | Freq: Two times a day (BID) | ORAL | Status: DC | PRN
  Administered 2017-12-05: 0.5 mg via ORAL
  Filled 2017-12-04: qty 1

## 2017-12-04 MED ORDER — PANTOPRAZOLE SODIUM 40 MG PO TBEC
40.0000 mg | DELAYED_RELEASE_TABLET | Freq: Two times a day (BID) | ORAL | Status: DC
  Administered 2017-12-05 – 2017-12-06 (×3): 40 mg via ORAL
  Filled 2017-12-04 (×3): qty 1

## 2017-12-04 MED ORDER — SENNOSIDES-DOCUSATE SODIUM 8.6-50 MG PO TABS
2.0000 | ORAL_TABLET | Freq: Every day | ORAL | Status: DC
  Administered 2017-12-05: 2 via ORAL
  Filled 2017-12-04: qty 2

## 2017-12-04 MED ORDER — LORATADINE 10 MG PO TABS
10.0000 mg | ORAL_TABLET | Freq: Every day | ORAL | Status: DC
  Administered 2017-12-05 – 2017-12-06 (×2): 10 mg via ORAL
  Filled 2017-12-04 (×2): qty 1

## 2017-12-04 MED ORDER — SODIUM CHLORIDE 0.9 % IV BOLUS
1000.0000 mL | Freq: Once | INTRAVENOUS | Status: AC
  Administered 2017-12-04: 1000 mL via INTRAVENOUS

## 2017-12-04 MED ORDER — RENA-VITE PO TABS
1.0000 | ORAL_TABLET | Freq: Every day | ORAL | Status: DC
  Administered 2017-12-05: 1 via ORAL
  Filled 2017-12-04 (×3): qty 1

## 2017-12-04 MED ORDER — GABAPENTIN 100 MG PO CAPS
100.0000 mg | ORAL_CAPSULE | Freq: Two times a day (BID) | ORAL | Status: DC
  Administered 2017-12-04 – 2017-12-06 (×4): 100 mg via ORAL
  Filled 2017-12-04 (×4): qty 1

## 2017-12-04 MED ORDER — IRON-VITAMIN C 65-125 MG PO TABS: 1.0000 | ORAL_TABLET | Freq: Every day | ORAL | Status: DC

## 2017-12-04 MED ORDER — FERROUS SULFATE 325 (65 FE) MG PO TABS
325.0000 mg | ORAL_TABLET | Freq: Every day | ORAL | Status: DC
  Administered 2017-12-05 – 2017-12-06 (×2): 325 mg via ORAL
  Filled 2017-12-04 (×3): qty 1

## 2017-12-04 MED ORDER — ASPIRIN EC 81 MG PO TBEC
81.0000 mg | DELAYED_RELEASE_TABLET | Freq: Every day | ORAL | Status: DC
  Administered 2017-12-05 – 2017-12-06 (×2): 81 mg via ORAL
  Filled 2017-12-04 (×2): qty 1

## 2017-12-04 MED ORDER — TAMSULOSIN HCL 0.4 MG PO CAPS
0.4000 mg | ORAL_CAPSULE | Freq: Every day | ORAL | Status: DC
  Administered 2017-12-04 – 2017-12-05 (×2): 0.4 mg via ORAL
  Filled 2017-12-04 (×2): qty 1

## 2017-12-04 MED ORDER — ALBUTEROL SULFATE (2.5 MG/3ML) 0.083% IN NEBU
2.5000 mg | INHALATION_SOLUTION | Freq: Four times a day (QID) | RESPIRATORY_TRACT | Status: DC | PRN
  Administered 2017-12-04 – 2017-12-05 (×2): 2.5 mg via RESPIRATORY_TRACT
  Filled 2017-12-04 (×2): qty 3

## 2017-12-04 MED ORDER — PANTOPRAZOLE SODIUM 40 MG PO TBEC: 40.0000 mg | DELAYED_RELEASE_TABLET | Freq: Every day | ORAL | Status: DC

## 2017-12-04 MED ORDER — DOCUSATE SODIUM 100 MG PO CAPS: 100.0000 mg | ORAL_CAPSULE | Freq: Two times a day (BID) | ORAL | Status: DC | PRN

## 2017-12-04 NOTE — ED Notes (Signed)
Second blood type sent per blood bank orders for type specific blood

## 2017-12-04 NOTE — Progress Notes (Signed)
Family Meeting Note  Advance Directive:yes  Today a meeting took place with the Patient and spouse.   The following clinical team members were present during this meeting:MD  The following were discussed:Patient's diagnosis: Acute blood loss anemia, GI bleed, recent stroke, chronic kidney disease stage IV, coronary artery disease, Patient's progosis: Unable to determine and Goals for treatment: DNR  Additional follow-up to be provided: GI and cardiology  Time spent during discussion:20 minutes  Vaughan Basta, MD

## 2017-12-04 NOTE — ED Provider Notes (Signed)
[facility] Emergency Department Provider Note  ____________________________________________  Time seen: Approximately 5:22 PM  I have reviewed the triage vital signs and the nursing notes.   HISTORY  Chief Complaint Weakness and Anemia    HPI [name] is a 82 y.o. 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  The patient reports that over the last several days, he has noticed progressively worsening exertional shortness of breath, lightheadedness with standing, and generalized weakness.  He saw his PMD, Dr. Sabra Heck, who recommended ER evaluation for anemia.  The patient had anemia requiring transfusion 8/19, but never had any GI follow-up including endoscopy or colonoscopy, which was recommended but refused due to concerns about sedation.  The patient denies any bloody, black or tarry stools or hematemesis.  Past Medical History:  Diagnosis Date  . Aortic stenosis   . CAD (coronary artery disease)   . CKD (chronic kidney disease) stage 4, GFR 15-29 ml/min (HCC)   . Hereditary and idiopathic peripheral neuropathy   . HTN (hypertension)   . Pulmonary nodules     Patient Active Problem List   Diagnosis Date Noted  . Wheezing   . Left foot drop   . Anemia of chronic disease   . Acute blood loss anemia   . Urinary frequency   . 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 Acute brainstem infarction ([place])   . Generalized OA   . History of gastritis   . History of GI bleed   . Chronic obstructive pulmonary disease ([place])   . AKI (acute kidney injury) ([place])   . Stage 3 chronic kidney disease ([place])   . Perianal abscess   . CVA (cerebral vascular accident) ([place]) 08/14/2017  . Acute renal failure superimposed on chronic kidney disease ([place]) 08/13/2017  . Thoracic aortic aneurysm without rupture ([place]) 12/04/2016  . Essential hypertension 12/04/2016     Past Surgical History:  Procedure Laterality Date  . CARDIAC CATHETERIZATION N/A 08/04/2015   Procedure: Right/Left Heart Cath and Coronary Angiography;  Surgeon: Yolonda Kida, MD;  Location: [facility];  Service: Cardiovascular;  Laterality: N/A;    Current Outpatient Rx  . Order #: [id number] Class: OTC  . Order #: [id number] Class: Historical Med  . Order #: [id number] Class: No Print  . Order #: [id number] Class: Print  . Order #: [id number] Class: Normal  . Order #: [id number] Class: Print  . Order #: [id number] Class: Print  . Order #: [id number] Class: Print  . Order #: [id number] Class: Historical Med  . Order #: [id number] Class: OTC  . Order #: [id number] Class: OTC  . Order #: [id number] Class: No Print  . Order #: [id number] Class: Normal  . Order #: [id number] Class: Normal  . Order #: [id number] Class: Print  . Order #: [id number] Class: OTC  . Order #: [id number] Class: Historical Med  . Order #: [id number] Class: No Print  . Order #: [id number] Class: OTC  . Order #: [id number] Class: Print  . Order #: [id number] Class: Print    Allergies Ace inhibitors; Nsaids; Isosorbide nitrate; and Prednisone  Family History  Problem Relation Age of Onset  . Hypertension Mother   . Heart attack Father     Social History Social History   Tobacco Use  . Smoking status: Never Smoker  . Smokeless tobacco: Current User    Types: Chew  Substance Use Topics  . Alcohol use: No    Frequency: Never  . Drug use: Never  Review of Systems Constitutional: No fever/chills.  Positive lightheadedness with standing.  Positive decreased exercise tolerance.  Positive generalized weakness. Eyes: No visual changes. ENT: No sore throat. No congestion or rhinorrhea. Cardiovascular: Denies chest pain. Denies palpitations. Respiratory: Denies shortness of breath.  No cough. Gastrointestinal: No abdominal pain.  No nausea, no vomiting.  No diarrhea.  No constipation.  No melena or bright red blood per  rectum. Genitourinary: Negative for dysuria. Musculoskeletal: Negative for back pain. Skin: Negative for rash. Neurological: Negative for headaches. No focal numbness, tingling or weakness.     ____________________________________________   PHYSICAL EXAM:  VITAL SIGNS: ED Triage Vitals  Enc Vitals Group     BP 12/04/17 1553 129/73     Pulse Rate 12/04/17 1553 84     Resp 12/04/17 1553 16     Temp 12/04/17 1553 98.2 F (36.8 C)     Temp Source 12/04/17 1553 Oral     SpO2 12/04/17 1552 100 %     Weight 12/04/17 1553 190 lb (86.2 kg)     Height 12/04/17 1553 5\' 11"  (1.803 m)     Head Circumference --      Peak Flow --      Pain Score 12/04/17 1553 0     Pain Loc --      Pain Edu? --      Excl. in [place]? --     Constitutional: Alert and oriented. Answers questions appropriately.  Pale appearing lying in the stretcher. Eyes: Conjunctivae are pale.  EOMI. No scleral icterus. Head: Atraumatic. Nose: No congestion/rhinnorhea. Mouth/Throat: Mucous membranes are moist.  Neck: No stridor.  Supple.  No JVD.  No meningismus. Cardiovascular: Normal rate, regular rhythm. No murmurs, rubs or gallops.  Respiratory: Normal respiratory effort.  No accessory muscle use or retractions. Lungs CTAB.  No wheezes, rales or ronchi. Gastrointestinal: Soft, nontender and nondistended.  No guarding or rebound.  No peritoneal signs. Genitourinary: No evidence of external or palpable internal hemorrhoids.  The patient does have brown stool that is guaiac positive. Musculoskeletal: No LE edema.  Neurologic:  A&Ox3.  Speech is clear.  Face and smile are symmetric.  EOMI.  Moves all extremities well. Skin:  Skin is warm, dry and intact. No rash noted. Psychiatric: Mood and affect are normal. Speech and behavior are normal.  Normal judgement  ____________________________________________   LABS (all labs ordered are listed, but only abnormal results are displayed)  Labs Reviewed  COMPREHENSIVE  METABOLIC PANEL - Abnormal; Notable for the following components:      Result Value   Chloride 113 (*)    CO2 18 (*)    Glucose, Bld 112 (*)    BUN 31 (*)    Creatinine, Ser 3.38 (*)    Calcium 8.4 (*)    AST 12 (*)    GFR calc non Af Amer 16 (*)    GFR calc Af Amer 18 (*)    All other components within normal limits  CBC - Abnormal; Notable for the following components:   RBC 2.41 (*)    Hemoglobin 6.1 (*)    HCT 20.8 (*)    MCH 25.3 (*)    MCHC 29.3 (*)    All other components within normal limits  TROPONIN I - Abnormal; Notable for the following components:   Troponin I 0.15 (*)    All other components within normal limits  PROTIME-INR  APTT  TYPE AND SCREEN  PREPARE RBC (CROSSMATCH)  ABO/RH   ____________________________________________  EKG  ED ECG REPORT I, Anne-Caroline Mariea Clonts, the attending physician, personally viewed and interpreted this ECG.   Date: 12/04/2017  EKG Time: 1555  Rate: 86  Rhythm: normal sinus rhythm  Axis: normal  Intervals:none  ST&T Change: No STEMI; patient does have 0.5-1 mm ST depressions in V4, V5 and V6.  The most recent prior EKG is 09/30/2013, which does not show these 0.51mmdepressions. ____________________________________________  [place]  Dg Chest 2 View  Result Date: 12/04/2017 CLINICAL DATA:  Weakness EXAM: CHEST - 2 VIEW COMPARISON:  08/26/2017 FINDINGS: Mild linear opacity at the bases. Large lung volumes with diaphragm flattening. There is no edema, consolidation, effusion, or pneumothorax. Normal heart size. Mild aortic tortuosity. IMPRESSION: Minor atelectasis. Electronically Signed   By: Monte Fantasia M.D.   On: 12/04/2017 16:41    ____________________________________________   PROCEDURES  Procedure(s) performed: None  Procedures  Critical Care performed: Yes, see critical care note(s) ____________________________________________   INITIAL IMPRESSION / ASSESSMENT AND PLAN / ED COURSE  Pertinent labs &  imaging results that were available during my care of the patient were reviewed by me and considered in my medical decision making (see chart for details).  82 y.o. male with a history of basilar CVA on Plavix and aspirin presenting for anemia, found to be guaiac positive on rectal examination.  Patient likely has a GI bleed which may be related to his anticoagulant use.  In the past, he has refused GI evaluation.  Here, we will give the patient a transfusion.  He also has an elevated troponin of 0.15 without any chest pain or shortness of breath; this needs to be closely monitored because he also has 1 mm ST changes in the lateral leads.  It is likely related to demand ischemia, and will need to be reevaluated by the hospitalist upon admission.  At this time I will plan to admit the patient for continued evaluation and treatment.  CRITICAL CARE Performed by: Eula Listen   Total critical care time: 35 minutes  Critical care time was exclusive of separately billable procedures and treating other patients.  Critical care was necessary to treat or prevent imminent or life-threatening deterioration.  Critical care was time spent personally by me on the following activities: development of treatment plan with patient and/or surrogate as well as nursing, discussions with consultants, evaluation of patient's response to treatment, examination of patient, obtaining history from patient or surrogate, ordering and performing treatments and interventions, ordering and review of laboratory studies, ordering and review of radiographic studies, pulse oximetry and re-evaluation of patient's condition.   ____________________________________________  FINAL CLINICAL IMPRESSION(S) / ED DIAGNOSES  Final diagnoses:  Gastrointestinal hemorrhage, unspecified gastrointestinal hemorrhage type  Anemia, unspecified type  Elevated troponin         NEW MEDICATIONS STARTED DURING THIS VISIT:  New  Prescriptions   No medications on file      Eula Listen, MD 12/04/17 1731

## 2017-12-04 NOTE — ED Triage Notes (Signed)
Pt in via POV, sent over from PCP.  Pt with complaints of generalized weakness and shortness of breath x 3-4 days.  [place] blood work shows Hgb 6.2, Creatinine 3.1.  Results placed in chart.  Vitals WDL.  NAD noted at this time.

## 2017-12-04 NOTE — H&P (Signed)
[place] [facility] [place] NAME: [name]    MR#:  [id number]  DATE OF BIRTH:  [birth year]  DATE OF ADMISSION:  12/04/2017  PRIMARY CARE PHYSICIAN: Rusty Aus, MD   REQUESTING/REFERRING PHYSICIAN: Mariea Clonts  CHIEF COMPLAINT:   Chief Complaint  Patient presents with  . Weakness  . Anemia    HISTORY OF PRESENT ILLNESS: [name]  is a 82 y.o. male with a known history of aortic stenosis, coronary artery disease, chronic kidney disease stage IV, hypertension, pulmonary nodule, stroke-was admitted to [facility] 2 months ago for acute stroke where he was also found to have GI bleed and low hemoglobin and anemia.  He was offered to have GI evaluation but he refused as in the past cardiologist at [facility] told him that he would be extremely high risk for any kind of procedure and should not be put under anesthesia at any time. They had discharged him after giving some blood transfusion and stabilizing his hemoglobin. Due to acute stroke and history of coronary artery disease he was discharged on aspirin and Plavix.  For last few days he is feeling increasingly weak.  He has some leftover left-sided weakness from the stroke but more week now and he had a small fall incidence so decided to come to emergency room. Hemoglobin noted to be less than 7 again and ER physician ordered 2 units of transfusion advised to admit to hospitalist service. Initially patient refused to get admitted as he said they not going to do anything on me so I can just get the transfusion and go home.  But after explaining the noninvasive options, and getting a formal evaluation by GI and cardiologist-he agreed to stay in the hospital.  PAST MEDICAL HISTORY:   Past Medical History:  Diagnosis Date  . Aortic stenosis   . CAD (coronary artery disease)   . CKD (chronic kidney disease) stage 4, GFR 15-29 ml/min (HCC)   . Hereditary and idiopathic peripheral neuropathy   . HTN  (hypertension)   . Pulmonary nodules   . Stroke [facility])     PAST SURGICAL HISTORY:  Past Surgical History:  Procedure Laterality Date  . CARDIAC CATHETERIZATION N/A 08/04/2015   Procedure: Right/Left Heart Cath and Coronary Angiography;  Surgeon: Yolonda Kida, MD;  Location: [facility];  Service: Cardiovascular;  Laterality: N/A;    SOCIAL HISTORY:  Social History   Tobacco Use  . Smoking status: Never Smoker  . Smokeless tobacco: Current User    Types: Chew  Substance Use Topics  . Alcohol use: No    Frequency: Never    FAMILY HISTORY:  Family History  Problem Relation Age of Onset  . Hypertension Mother   . Heart attack Father     DRUG ALLERGIES:  Allergies  Allergen Reactions  . Ace Inhibitors Other (See Comments)    Renal Failure   . Nsaids Other (See Comments)    GI Bleed  . Isosorbide Nitrate Nausea Only    Headache    REVIEW OF SYSTEMS:   CONSTITUTIONAL: No fever, fatigue or weakness.  EYES: No blurred or double vision.  EARS, NOSE, AND THROAT: No tinnitus or ear pain.  RESPIRATORY: No cough, shortness of breath, wheezing or hemoptysis.  CARDIOVASCULAR: No chest pain, orthopnea, edema.  GASTROINTESTINAL: No nausea, vomiting, diarrhea or abdominal pain.  GENITOURINARY: No dysuria, hematuria.  ENDOCRINE: No polyuria, nocturia,  HEMATOLOGY: No anemia, easy bruising or bleeding SKIN: No rash  or lesion. MUSCULOSKELETAL: No joint pain or arthritis.   NEUROLOGIC: No tingling, numbness, weakness.  PSYCHIATRY: No anxiety or depression.   MEDICATIONS AT HOME:  Prior to Admission medications   Medication Sig Start Date End Date Taking? Authorizing Provider  acetaminophen (TYLENOL) 325 MG tablet Take 1-2 tablets (325-650 mg total) by mouth every 4 (four) hours as needed for mild pain. 09/05/17  Yes Love, Ivan Anchors, PA-C  albuterol (PROVENTIL HFA;VENTOLIN HFA) 108 (90 Base) MCG/ACT inhaler Inhale 2 puffs into the lungs every 6 (six) hours as needed  for wheezing or shortness of breath.   Yes [provider]  ALPRAZolam Duanne Moron) 0.5 MG tablet Take 0.5 mg by mouth 2 (two) times daily as needed for anxiety.    Yes [provider]  aspirin EC 81 MG EC tablet Take 1 tablet (81 mg total) by mouth daily. 09/06/17  Yes Angiulli, Lavon Paganini, PA-C  atorvastatin (LIPITOR) 40 MG tablet Take 1 tablet (40 mg total) by mouth daily at 6 PM. Patient taking differently: Take 40 mg by mouth at bedtime.  09/06/17  Yes Angiulli, Lavon Paganini, PA-C  calcitRIOL (ROCALTROL) 0.25 MCG capsule Take 1 capsule (0.25 mcg total) by mouth every Monday, Wednesday, and Friday. 08/21/17  Yes Gladstone Lighter, MD  citalopram (CELEXA) 20 MG tablet Take 1.5 tablets (30 mg total) by mouth daily. 09/06/17  Yes Angiulli, Lavon Paganini, PA-C  clopidogrel (PLAVIX) 75 MG tablet Take 1 tablet (75 mg total) by mouth daily. 09/06/17  Yes Angiulli, Lavon Paganini, PA-C  gabapentin (NEURONTIN) 100 MG capsule Take 1 capsule (100 mg total) by mouth 2 (two) times daily. 09/06/17  Yes Angiulli, Lavon Paganini, PA-C  Iron-Vitamin C (VITRON-C) 65-125 MG TABS Take 1 tablet by mouth daily.   Yes [provider]  pantoprazole (PROTONIX) 40 MG tablet Take 1 tablet (40 mg total) by mouth daily. 09/06/17 09/06/18 Yes Angiulli, Lavon Paganini, PA-C  polyvinyl alcohol (LIQUIFILM TEARS) 1.4 % ophthalmic solution Place 1 drop into both eyes as needed for dry eyes (Q 6 HRS). Patient taking differently: Place 1 drop into both eyes every 6 (six) hours as needed for dry eyes.  09/05/17  Yes Love, Ivan Anchors, PA-C  sodium chloride (OCEAN) 0.65 % SOLN nasal spray Place 1 spray into both nostrils as needed for congestion. 09/05/17  Yes Love, Ivan Anchors, PA-C  tamsulosin (FLOMAX) 0.4 MG CAPS capsule Take 1 capsule (0.4 mg total) by mouth daily after breakfast. Patient taking differently: Take 0.4 mg by mouth at bedtime.  09/06/17 09/06/18 Yes Angiulli, Lavon Paganini, PA-C  traMADol (ULTRAM) 50 MG tablet Take 1 tablet (50 mg total) by mouth  every 6 (six) hours as needed for moderate pain. 09/06/17  Yes Angiulli, Lavon Paganini, PA-C  umeclidinium-vilanterol (ANORO ELLIPTA) 62.5-25 MCG/INH AEPB Inhale 1 puff into the lungs daily.   Yes [provider]  loratadine (CLARITIN) 10 MG tablet Take 1 tablet (10 mg total) by mouth daily. Patient not taking: Reported on 12/04/2017 09/06/17   Bary Leriche, PA-C  multivitamin (RENA-VIT) TABS tablet Take 1 tablet by mouth at bedtime. Patient not taking: Reported on 12/04/2017 09/05/17   Love, Ivan Anchors, PA-C  senna-docusate (SENOKOT-S) 8.6-50 MG tablet Take 2 tablets by mouth at bedtime. Patient not taking: Reported on 12/04/2017 09/06/17   Angiulli, Lavon Paganini, PA-C      PHYSICAL EXAMINATION:   VITAL SIGNS: Blood pressure (!) 156/85, pulse 92, temperature (!) 97.4 F (36.3 C), temperature source Oral, resp. rate (!) 23, height 5'  11" (1.803 m), weight 86.2 kg, SpO2 100 %.  GENERAL:  82 y.o.-year-old patient lying in the bed with no acute distress.  EYES: Pupils equal, round, reactive to light and accommodation. No scleral icterus. Extraocular muscles intact.  Conjunctive are pale. HEENT: Head atraumatic, normocephalic. Oropharynx and nasopharynx clear.  NECK:  Supple, no jugular venous distention. No thyroid enlargement, no tenderness.  LUNGS: Normal breath sounds bilaterally, no wheezing, rales,rhonchi or crepitation. No use of accessory muscles of respiration.  CARDIOVASCULAR: S1, S2 normal. No murmurs, rubs, or gallops.  ABDOMEN: Soft, nontender, nondistended. Bowel sounds present. No organomegaly or mass.  EXTREMITIES: No pedal edema, cyanosis, or clubbing.  NEUROLOGIC: Cranial nerves II through XII are intact. Muscle strength 5/5 in right extremities-4 out of 5 in left extremities. Sensation intact. Gait not checked.  PSYCHIATRIC: The patient is alert and oriented x 3.  SKIN: No obvious rash, lesion, or ulcer.   LABORATORY PANEL:   CBC Recent Labs  Lab 12/04/17 1606  WBC 5.9   HGB 6.1*  HCT 20.8*  PLT 240  MCV 86.3  MCH 25.3*  MCHC 29.3*  RDW 15.1   ------------------------------------------------------------------------------------------------------------------  Chemistries  Recent Labs  Lab 12/04/17 1606  NA 143  K 3.8  CL 113*  CO2 18*  GLUCOSE 112*  BUN 31*  CREATININE 3.38*  CALCIUM 8.4*  AST 12*  ALT 10  ALKPHOS 87  BILITOT 0.5   ------------------------------------------------------------------------------------------------------------------ estimated creatinine clearance is 18.6 mL/min (A) (by C-G formula based on SCr of 3.38 mg/dL (H)). ------------------------------------------------------------------------------------------------------------------ No results for input(s): TSH, T4TOTAL, T3FREE, THYROIDAB in the last 72 hours.  Invalid input(s): FREET3   Coagulation profile Recent Labs  Lab 12/04/17 1646  INR 1.14   ------------------------------------------------------------------------------------------------------------------- No results for input(s): DDIMER in the last 72 hours. -------------------------------------------------------------------------------------------------------------------  Cardiac Enzymes Recent Labs  Lab 12/04/17 1606  TROPONINI 0.15*   ------------------------------------------------------------------------------------------------------------------ Invalid input(s): POCBNP  ---------------------------------------------------------------------------------------------------------------  Urinalysis    Component Value Date/Time   COLORURINE YELLOW 08/26/2017 [place] 08/26/2017 1851   APPEARANCEUR Cloudy 09/30/2013 2011   LABSPEC 1.015 08/26/2017 1851   LABSPEC 1.017 09/30/2013 2011   PHURINE 5.0 08/26/2017 1851   GLUCOSEU NEGATIVE 08/26/2017 1851   GLUCOSEU Negative 09/30/2013 2011   HGBUR SMALL (A) 08/26/2017 1851   BILIRUBINUR NEGATIVE 08/26/2017 1851   BILIRUBINUR 1+  09/30/2013 2011   KETONESUR NEGATIVE 08/26/2017 1851   PROTEINUR 100 (A) 08/26/2017 1851   NITRITE NEGATIVE 08/26/2017 1851   LEUKOCYTESUR NEGATIVE 08/26/2017 1851   LEUKOCYTESUR Negative 09/30/2013 2011     RADIOLOGY: Dg Chest 2 View  Result Date: 12/04/2017 CLINICAL DATA:  Weakness EXAM: CHEST - 2 VIEW COMPARISON:  08/26/2017 FINDINGS: Mild linear opacity at the bases. Large lung volumes with diaphragm flattening. There is no edema, consolidation, effusion, or pneumothorax. Normal heart size. Mild aortic tortuosity. IMPRESSION: Minor atelectasis. Electronically Signed   By: Monte Fantasia M.D.   On: 12/04/2017 16:41    EKG: Orders placed or performed during the hospital encounter of 12/04/17  . EKG 12-Lead  . EKG 12-Lead    IMPRESSION AND PLAN:  *Acute on chronic anemia due to blood loss Acute GI bleeding  2 units of blood transfusion ordered by ER. Because patient has history of coronary artery disease and recent stroke, I would continue aspirin and Plavix for now I have called GI and cardiology consult to decide further plan. Patient was told by his [facility] cardiologist " he is too high risk to have any kind of  procedure."  So he is refusing to have any procedure where he needs anesthesia. I will get bleeding scan meanwhile.  *Chronic kidney disease stage IV Continue to monitor, stable now.  *Recent stroke Continue aspirin, Plavix, atorvastatin.  *Coronary artery disease Continue aspirin, Plavix, atorvastatin and get cardiologist evaluation.  *BPH Continue Flomax.  *Elevated troponin Patient does not have any chest pain. This seems to be secondary to chronic kidney disease and stress-induced.  All the records are reviewed and case discussed with ED provider. Management plans discussed with the patient, family and they are in agreement.  CODE STATUS: DNR. Code Status History    Date Active Date Inactive Code Status Order ID Comments User Context   08/19/2017 1524  09/07/2017 1447 Full Code [id number]  Flora Lipps Inpatient   08/15/2017 0358 08/19/2017 1458 Full Code [id number]  Arta Silence, MD Inpatient   08/13/2017 1704 08/15/2017 0358 Full Code [id number]  Demetrios Loll, MD Inpatient   08/04/2015 1541 08/04/2015 2020 Full Code [id number]  Yolonda Kida, MD Inpatient    Advance Directive Documentation     Most Recent Value  Type of Advance Directive  Healthcare Power of Attorney, Living will  Pre-existing out of facility DNR order (yellow form or pink MOST form)  -  "MOST" Form in Place?  -     Plan discussed with ER physician, patient and his wife in the room  TOTAL TIME TAKING CARE OF THIS PATIENT: 45 minutes.    Vaughan Basta M.D on 12/04/2017   Between 7am to 6pm - Pager - [phone]  After 6pm go to [email or web address] - password [email or web address] [facility]  Office  [phone]  CC: Primary care physician; Rusty Aus, MD   Note: This dictation was prepared with Dragon dictation along with smaller phrase technology. Any transcriptional errors that result from this process are unintentional.

## 2017-12-04 NOTE — ED Notes (Signed)
First RN: pt sent over from [facility]. Dr. Sabra Heck spoke to this RN. Stated that pt has a creat of 3.2, hemoglobin of 6, which has decreased from 11 in 6 weeks. Dr. Sabra Heck stated pt was c/o of CP, SOB, and weakness. Hx stroke earlier this year and anemia.

## 2017-12-05 LAB — BASIC METABOLIC PANEL
ANION GAP: 6 (ref 5–15)
BUN: 28 mg/dL — ABNORMAL HIGH (ref 8–23)
CO2: 20 mmol/L — AB (ref 22–32)
Calcium: 8 mg/dL — ABNORMAL LOW (ref 8.9–10.3)
Chloride: 116 mmol/L — ABNORMAL HIGH (ref 98–111)
Creatinine, Ser: 3.1 mg/dL — ABNORMAL HIGH (ref 0.61–1.24)
GFR calc non Af Amer: 18 mL/min — ABNORMAL LOW (ref >60)
GFR, EST AFRICAN AMERICAN: 20 mL/min — AB (ref >60)
GLUCOSE: 101 mg/dL — AB (ref 70–99)
POTASSIUM: 4 mmol/L (ref 3.5–5.1)
Sodium: 142 mmol/L (ref 135–145)

## 2017-12-05 LAB — HEMOGLOBIN AND HEMATOCRIT, BLOOD
HEMATOCRIT: 24.8 % — AB (ref 39.0–52.0)
Hemoglobin: 7.6 g/dL — ABNORMAL LOW (ref 13.0–17.0)

## 2017-12-05 LAB — CBC
HEMATOCRIT: 22.5 % — AB (ref 39.0–52.0)
Hemoglobin: 6.8 g/dL — ABNORMAL LOW (ref 13.0–17.0)
MCH: 25.9 pg — AB (ref 26.0–34.0)
MCHC: 30.2 g/dL (ref 30.0–36.0)
MCV: 85.6 fL (ref 80.0–100.0)
NRBC: 0 % (ref 0.0–0.2)
Platelets: 207 10*3/uL (ref 150–400)
RBC: 2.63 MIL/uL — AB (ref 4.22–5.81)
RDW: 14.7 % (ref 11.5–15.5)
WBC: 5.6 10*3/uL (ref 4.0–10.5)

## 2017-12-05 LAB — PREPARE RBC (CROSSMATCH)

## 2017-12-05 MED ORDER — ROPINIROLE HCL 1 MG PO TABS
0.5000 mg | ORAL_TABLET | Freq: Once | ORAL | Status: AC
  Administered 2017-12-05: 0.5 mg via ORAL
  Filled 2017-12-05: qty 1

## 2017-12-05 MED ORDER — ALUM & MAG HYDROXIDE-SIMETH 200-200-20 MG/5ML PO SUSP
15.0000 mL | Freq: Four times a day (QID) | ORAL | Status: DC | PRN
  Administered 2017-12-05: 15 mL via ORAL
  Filled 2017-12-05: qty 30

## 2017-12-05 MED ORDER — SODIUM CHLORIDE 0.9% IV SOLUTION
Freq: Once | INTRAVENOUS | Status: AC
  Administered 2017-12-05: 15:00:00 via INTRAVENOUS

## 2017-12-05 NOTE — Progress Notes (Signed)
RBC transfusion completed with adverse reaction.

## 2017-12-05 NOTE — Progress Notes (Addendum)
Pt hgb went up to 7.6 from 6.8. Notify prime. Will continue to monitor.  Update 0435: CCMD called and notified that pt have no active order for heart monitor. Notify prime. Will continue to monitor.  Update 0700: Incoming shift was notified about pt concern of BP being on high side ( 143/90) and about not having an active order for heart monitor. Will continue to monitor.

## 2017-12-05 NOTE — Progress Notes (Signed)
PT Cancellation Note  Patient Details Name: ILIR MAHRT MRN: [id number] DOB: 1935/05/03   Cancelled Treatment:    Reason Eval/Treat Not Completed: Medical issues which prohibited therapy(Consult received and chart reviewed.  Per chart, current HgB 6.8 (after 2 units); RN preparing to start additional unit at this time.  Will hold PT evaluation until transfusion complete and patient appropriate for exertional activity.)   Heatherly Stenner H. Owens Shark, PT, DPT, NCS 12/05/17, 2:48 PM [phone]

## 2017-12-05 NOTE — Consult Note (Signed)
Consult cancelled.

## 2017-12-05 NOTE — Care Management Note (Signed)
Case Management Note  Patient Details  Name: [name] MRN: [id number] Date of Birth: 1935-07-25  Subjective/Objective:    Patient is from home with wife.  Admitted with a GI bleed/weakness.  Currently receiving 3rd bag of PRBC's.   Initial HGB was 6.1; after two units it was 6.8.  Denies difficulty obtaining medications or with medical care.  His wife drives him to appointments.  He has a wheel chair, walker and a cane at home.  He has used [facility] in the past.  Made a heads up referral to [place] with [facility] for home health RN, PT, SW and aide.  Currently on room air with sats 100%.  RNCM will continue to follow.              Action/Plan:   Expected Discharge Date:                  Expected Discharge Plan:  [place]  In-House Referral:     Discharge planning Services  CM Consult  Post Acute Care Choice:  Home Health Choice offered to:  Patient, Spouse  DME Arranged:    DME Agency:     HH Arranged:  RN, PT, Nurse's Aide, Social Work [employer] Agency:  [facility]  Status of Service:  In process, will continue to follow  If discussed at Long Length of Stay Meetings, dates discussed:    Additional Comments:  Elza Rafter, RN 12/05/2017, 4:25 PM

## 2017-12-05 NOTE — Care Management Obs Status (Signed)
[place] NOTIFICATION   Patient Details  Name: ZAYDRIAN BATTA MRN: [id number] Date of Birth: 01-Jul-1935   Medicare Observation Status Notification Given:  Yes    Elza Rafter, RN 12/05/2017, 4:20 PM

## 2017-12-05 NOTE — Progress Notes (Signed)
[facility] [place] NAME: [name]    MR#:  [id number]  DATE OF BIRTH:  05-Jan-1936  SUBJECTIVE:   Patient came in with generalized weakness and was found to have hemoglobin of 6.1. He received two units of blood transfusion. He feels okay. Patient is reluctant to get any G.I. workup since he was told about a year ago that physician at [facility] that he is at a high risk for any sedation required for procedure. As any bright red blood per rectum. Denies any melena not extolled. REVIEW OF SYSTEMS:   Review of Systems  Constitutional: Negative for chills, fever and weight loss.  HENT: Negative for ear discharge, ear pain and nosebleeds.   Eyes: Negative for blurred vision, pain and discharge.  Respiratory: Negative for sputum production, shortness of breath, wheezing and stridor.   Cardiovascular: Negative for chest pain, palpitations, orthopnea and PND.  Gastrointestinal: Negative for abdominal pain, diarrhea, nausea and vomiting.  Genitourinary: Negative for frequency and urgency.  Musculoskeletal: Negative for back pain and joint pain.  Neurological: Positive for weakness. Negative for sensory change, speech change and focal weakness.  Psychiatric/Behavioral: Negative for depression and hallucinations. The patient is not nervous/anxious.    Tolerating Diet:yes Tolerating PT: pending  DRUG ALLERGIES:   Allergies  Allergen Reactions  . Ace Inhibitors Other (See Comments)    Renal Failure   . Nsaids Other (See Comments)    GI Bleed  . Isosorbide Nitrate Nausea Only    Headache    VITALS:  Blood pressure 130/81, pulse 89, temperature 98 F (36.7 C), temperature source Oral, resp. rate 19, height 5\' 11"  (1.803 m), weight 86.2 kg, SpO2 97 %.  PHYSICAL EXAMINATION:   Physical Exam  GENERAL:  82 y.o.-year-old patient lying in the bed with no acute distress.  EYES: Pupils equal, round, reactive to light and accommodation. No  scleral icterus. Extraocular muscles intact.  HEENT: Head atraumatic, normocephalic. Oropharynx and nasopharynx clear.  NECK:  Supple, no jugular venous distention. No thyroid enlargement, no tenderness.  LUNGS: Normal breath sounds bilaterally, no wheezing, rales, rhonchi. No use of accessory muscles of respiration.  CARDIOVASCULAR: S1, S2 normal. No murmurs, rubs, or gallops.  ABDOMEN: Soft, nontender, nondistended. Bowel sounds present. No organomegaly or mass.  EXTREMITIES: No cyanosis, clubbing or edema b/l.    NEUROLOGIC: Cranial nerves II through XII are intact. No focal Motor or sensory deficits b/l.   PSYCHIATRIC:  patient is alert and oriented x 3.  SKIN: No obvious rash, lesion, or ulcer.   LABORATORY PANEL:  CBC Recent Labs  Lab 12/05/17 0524  WBC 5.6  HGB 6.8*  HCT 22.5*  PLT 207    Chemistries  Recent Labs  Lab 12/04/17 1606 12/05/17 0524  NA 143 142  K 3.8 4.0  CL 113* 116*  CO2 18* 20*  GLUCOSE 112* 101*  BUN 31* 28*  CREATININE 3.38* 3.10*  CALCIUM 8.4* 8.0*  AST 12*  --   ALT 10  --   ALKPHOS 87  --   BILITOT 0.5  --    Cardiac Enzymes Recent Labs  Lab 12/04/17 1606  TROPONINI 0.15*   RADIOLOGY:  Dg Chest 2 View  Result Date: 12/04/2017 CLINICAL DATA:  Weakness EXAM: CHEST - 2 VIEW COMPARISON:  08/26/2017 FINDINGS: Mild linear opacity at the bases. Large lung volumes with diaphragm flattening. There is no edema, consolidation, effusion, or pneumothorax. Normal heart size. Mild aortic tortuosity. IMPRESSION: Minor atelectasis. Electronically  Signed   By: Monte Fantasia M.D.   On: 12/04/2017 16:41   ASSESSMENT AND PLAN:  [name]  is a 82 y.o. male with a known history of aortic stenosis, coronary artery disease, chronic kidney disease stage IV, hypertension, pulmonary nodule, stroke-was admitted to [facility] 2 months ago for acute stroke where he was also found to have GI bleed and low hemoglobin and anemia.  He was offered to have GI  evaluation but he refused as in the past cardiologist at [facility] told him that he would be extremely high risk for any kind of procedure and should not be put under anesthesia at any time. They had discharged him after giving some blood transfusion and stabilizing his hemoglobin. Due to acute stroke and history of coronary artery disease he was discharged on aspirin and Plavix.  *Acute on chronic anemia due to blood loss suspected GI bleeding -2 units of blood transfusion ordered by ER. -Because patient has history of coronary artery disease and recent stroke (august 2019) I would continue aspirin and Plavix for now -Patient was told by his [facility] cardiologist " he is too high risk to have any kind of procedure."  So he is refusing to have any procedure where he needs anesthesia. -Patient not wanting to do any G.I. evaluation. G.I. Consultation canceled. -Globin 6.1--- two unit blood transfusion--- 6.8--- one unit blood transfusion--- hemoglobin -patient was also offered to get G.I. workup done in August 2019 when he was admitted with stroke. He declined at that time as well.  *Chronic kidney disease stage IV Continue to monitor, stable now.  *Recent stroke Continue aspirin, Plavix, atorvastatin.  *Coronary artery disease Continue aspirin, Plavix, atorvastatin and get cardiologist evaluation.  *BPH Continue Flomax.  *Elevated troponin Patient does not have any chest pain. This seems to be secondary to chronic kidney disease and stress-induced  Patient agrees with above plan.  PT to see pt  Case discussed with Care Management/Social Worker. Management plans discussed with the patient, family and they are in agreement.  CODE STATUS: DNR  DVT Prophylaxis: SCD  TOTAL TIME TAKING CARE OF THIS PATIENT: *40* minutes.  >50% time spent on counselling and coordination of care  POSSIBLE D/C IN *1-2* DAYS, DEPENDING ON CLINICAL CONDITION.  Note: This dictation was prepared with Dragon  dictation along with smaller phrase technology. Any transcriptional errors that result from this process are unintentional.  Fritzi Mandes M.D on 12/05/2017 at 11:46 AM  Between 7am to 6pm - Pager - [phone]  After 6pm go to [email or web address] - password [email or web address]  Office  [phone]  CC: Primary care physician; Rusty Aus, MDPatient ID: [name], male   DOB: March 19, 1935, 82 y.o.   MRN: [id number]

## 2017-12-06 NOTE — Evaluation (Signed)
Physical Therapy Evaluation Patient Details Name: [name] MRN: [id number] DOB: November 24, 1935 Today's Date: 12/06/2017   History of Present Illness  82 yo male with onset of GI bleed with transfusions now at hgb 7.6.  Pt has atelectasis, mild troponin elevation and previous but still obvious L hemiparesis.  PMHx:   aortic stenosis, CAD, HTN, CKD 4, PN, Stroke, pulm nodule,   Clinical Impression  Pt was seen for evaluation of mobility and demonstrates a good tolerance for gait but has weakness and stiffness in ankles, presenting therefore a higher fall risk.  Will anticipate he transitions to HHPT to follow up for the difficulties of mobility and to ck O2 sats with exertion. He had O2 sat 98% after gait and pulses were 103-106 with all pre and post gait efforts.  Follow acutely as needed for strength and balance until DC if not occurring today.    Follow Up Recommendations Home health PT;Supervision for mobility/OOB    Equipment Recommendations  None recommended by PT(should have equipment)    Recommendations for Other Services       Precautions / Restrictions Precautions Precautions: Fall Precaution Comments: pt is having difficulty with L hemi and disuse weakness of being in bed Restrictions Weight Bearing Restrictions: No      Mobility  Bed Mobility Overal bed mobility: Modified Independent                Transfers Overall transfer level: Modified independent Equipment used: Rolling walker (2 wheeled)             General transfer comment: using forward momentum to assist standing with hands on RW so is modified technique at baseline likely  Ambulation/Gait Ambulation/Gait assistance: Min guard Gait Distance (Feet): 120 Feet Assistive device: Rolling walker (2 wheeled);1 person hand held assist Gait Pattern/deviations: Step-through pattern;Wide base of support;Trunk flexed Gait velocity: reduced Gait velocity interpretation: <1.31 ft/sec, indicative of  household ambulator General Gait Details: pt is in need of cues for direction of walker on the complete turn  Stairs            Wheelchair Mobility    Modified Rankin (Stroke Patients Only)       Balance Overall balance assessment: Needs assistance Sitting-balance support: Feet supported Sitting balance-Leahy Scale: Good     Standing balance support: Bilateral upper extremity supported;During functional activity Standing balance-Leahy Scale: Fair                               Pertinent Vitals/Pain Pain Assessment: No/denies pain    Home Living Family/patient expects to be discharged to:: Private residence Living Arrangements: Spouse/significant other Available Help at Discharge: Family Type of Home: House Home Access: Stairs to enter Entrance Stairs-Rails: Left Entrance Stairs-Number of Steps: 1 Home Layout: One level Home Equipment: Environmental consultant - 2 wheels;Walker - 4 wheels;Cane - quad;Bedside commode      Prior Function Level of Independence: Needs assistance   Gait / Transfers Assistance Needed: I with RW per pt  ADL's / Homemaking Assistance Needed: has wife to assist with self care as he struggles with L side weakness        Hand Dominance   Dominant Hand: Right    Extremity/Trunk Assessment   Upper Extremity Assessment Upper Extremity Assessment: Generalized weakness;LUE deficits/detail LUE Deficits / Details: L arm can maneuver walker but is in flexed tone posture on the bed LUE Coordination: decreased fine motor;decreased gross motor  Lower Extremity Assessment Lower Extremity Assessment: Generalized weakness    Cervical / Trunk Assessment Cervical / Trunk Assessment: Normal  Communication   Communication: Expressive difficulties(slightly slurred speech)  Cognition Arousal/Alertness: Awake/alert Behavior During Therapy: WFL for tasks assessed/performed Overall Cognitive Status: No family/caregiver present to determine baseline  cognitive functioning                                 General Comments: has some inconsistencies with old chart history and current information      General Comments General comments (skin integrity, edema, etc.): pt is noted to struggle with follow through to put his R arm into the sleeve of his gown    Exercises     Assessment/Plan    PT Assessment Patient needs continued PT services  PT Problem List Decreased strength;Decreased range of motion;Decreased activity tolerance;Decreased balance;Decreased mobility;Decreased coordination;Decreased knowledge of use of DME;Decreased safety awareness;Cardiopulmonary status limiting activity       PT Treatment Interventions DME instruction;Gait training;Stair training;Functional mobility training;Therapeutic activities;Therapeutic exercise;Balance training;Neuromuscular re-education;Patient/family education    PT Goals (Current goals can be found in the Care Plan section)  Acute Rehab PT Goals Patient Stated Goal: to get home to wife and feel better PT Goal Formulation: With patient Time For Goal Achievement: 12/20/17 Potential to Achieve Goals: Good    Frequency Min 2X/week   Barriers to discharge Inaccessible home environment step to enter house    Co-evaluation               AM-PAC PT "6 Clicks" Daily Activity  Outcome Measure Difficulty turning over in bed (including adjusting bedclothes, sheets and blankets)?: A Little Difficulty moving from lying on back to sitting on the side of the bed? : A Little Difficulty sitting down on and standing up from a chair with arms (e.g., wheelchair, bedside commode, etc,.)?: Unable Help needed moving to and from a bed to chair (including a wheelchair)?: A Little Help needed walking in hospital room?: A Little Help needed climbing 3-5 steps with a railing? : A Little 6 Click Score: 16    End of Session Equipment Utilized During Treatment: Gait belt Activity Tolerance:  Patient limited by fatigue Patient left: in bed;with call bell/phone within reach;with family/visitor present Nurse Communication: Mobility status PT Visit Diagnosis: Unsteadiness on feet (R26.81);Muscle weakness (generalized) (M62.81);Difficulty in walking, not elsewhere classified (R26.2)    Time: 8466-5993 PT Time Calculation (min) (ACUTE ONLY): 24 min   Charges:   PT Evaluation $PT Eval Moderate Complexity: 1 Mod PT Treatments $Gait Training: 8-22 mins         Ramond Dial 12/06/2017, 10:48 AM   Mee Hives, PT MS Acute Rehab Dept. Number: [place] and [place]

## 2017-12-06 NOTE — Progress Notes (Signed)
Patient given discharge instructions with wife at bedside. Patient and wife verbalized understanding with no further questions. Both IV's taken out and tele monitor off. Patient going home via family vehicle. Home health set up.

## 2017-12-06 NOTE — Discharge Summary (Signed)
[name] at [place] NAME: [name]    MR#:  [id number]  DATE OF BIRTH:  Oct 04, 1935  DATE OF ADMISSION:  12/04/2017 ADMITTING PHYSICIAN: Vaughan Basta, MD  DATE OF DISCHARGE: 12/06/2017  PRIMARY CARE PHYSICIAN: Rusty Aus, MD    ADMISSION DIAGNOSIS:  Elevated troponin [R79.89] Gastrointestinal hemorrhage, unspecified gastrointestinal hemorrhage type [K92.2] Anemia, unspecified type [D64.9]  DISCHARGE DIAGNOSIS:  acute on chronic anemia suspected slow G.I. Bleed recent CVA history of CAD  SECONDARY DIAGNOSIS:   Past Medical History:  Diagnosis Date  . Aortic stenosis   . CAD (coronary artery disease)   . CKD (chronic kidney disease) stage 4, GFR 15-29 ml/min (HCC)   . Hereditary and idiopathic peripheral neuropathy   . HTN (hypertension)   . Pulmonary nodules   . Stroke [facility])     HOSPITAL COURSE:   JesseColeis an82 y.o.malewith a known history of aortic stenosis, coronary artery disease, chronic kidney disease stage IV, hypertension, pulmonary nodule, stroke-was admitted to [facility] 2 months ago for acute stroke where he was also found to have GI bleed and low hemoglobin and anemia. He was offered to have GI evaluation but he refused as in the past cardiologist at [facility] told him that he would be extremely high risk for any kind of procedure and should not be put under anesthesia at any time. They had discharged him after giving some blood transfusion and stabilizing his hemoglobin. Due to acute stroke and history of coronary artery disease he was discharged on aspirin and Plavix.  *Acute on chronic anemia due to blood loss suspected GI bleeding - patient has history of coronary artery disease and recent stroke (august 2019) I would continue aspirin and Plavix for now -Patient was told by his [facility] cardiologist"he is too high risk to have any kind of procedure."So he is refusing to have any  procedure where he needs anesthesia. -Patient not wanting to do any G.I. evaluation. G.I. Consultation canceled. -hemoGlobin 6.1--- two unit blood transfusion--- 6.8--- one unit blood transfusion--- 7.6 (baseline) -patient was also offered to get G.I. workup done in August 2019 when he was admitted with stroke. He declined at that time as well. -cont PPIT and iron pills  *Chronic kidney disease stage IV Continue to monitor, stable now.  *Recent stroke Continue aspirin, Plavix, atorvastatin.  *Coronary artery disease Continue aspirin, Plavix, atorvastatin and get cardiologist evaluation.  *BPH Continue Flomax.  *Elevated troponin Patient does not have any chest pain. This seems to be secondary to chronic kidney disease and stress-induced  Patient agrees with above plan.  PT recommends home health PT. Will arrange it.  Above was informed to Dr. Emily Filbert patient's primary care physician  CONSULTS OBTAINED:  Treatment Team:  Jonathon Bellows, MD Corey Skains, MD  DRUG ALLERGIES:   Allergies  Allergen Reactions  . Ace Inhibitors Other (See Comments)    Renal Failure   . Nsaids Other (See Comments)    GI Bleed  . Isosorbide Nitrate Nausea Only    Headache    DISCHARGE MEDICATIONS:   Allergies as of 12/06/2017      Reactions   Ace Inhibitors Other (See Comments)   Renal Failure   Nsaids Other (See Comments)   GI Bleed   Isosorbide Nitrate Nausea Only   Headache      Medication List    STOP taking these medications   loratadine 10 MG tablet Commonly known as:  CLARITIN  senna-docusate 8.6-50 MG tablet Commonly known as:  Senokot-S     TAKE these medications   acetaminophen 325 MG tablet Commonly known as:  TYLENOL Take 1-2 tablets (325-650 mg total) by mouth every 4 (four) hours as needed for mild pain.   albuterol 108 (90 Base) MCG/ACT inhaler Commonly known as:  PROVENTIL HFA;VENTOLIN HFA Inhale 2 puffs into the lungs every 6 (six) hours  as needed for wheezing or shortness of breath.   ALPRAZolam 0.5 MG tablet Commonly known as:  XANAX Take 0.5 mg by mouth 2 (two) times daily as needed for anxiety.   aspirin 81 MG EC tablet Take 1 tablet (81 mg total) by mouth daily.   atorvastatin 40 MG tablet Commonly known as:  LIPITOR Take 1 tablet (40 mg total) by mouth daily at 6 PM. What changed:  when to take this   calcitRIOL 0.25 MCG capsule Commonly known as:  ROCALTROL Take 1 capsule (0.25 mcg total) by mouth every Monday, Wednesday, and Friday.   citalopram 20 MG tablet Commonly known as:  CELEXA Take 1.5 tablets (30 mg total) by mouth daily.   clopidogrel 75 MG tablet Commonly known as:  PLAVIX Take 1 tablet (75 mg total) by mouth daily.   gabapentin 100 MG capsule Commonly known as:  NEURONTIN Take 1 capsule (100 mg total) by mouth 2 (two) times daily.   multivitamin Tabs tablet Take 1 tablet by mouth at bedtime.   pantoprazole 40 MG tablet Commonly known as:  PROTONIX Take 1 tablet (40 mg total) by mouth daily.   polyvinyl alcohol 1.4 % ophthalmic solution Commonly known as:  LIQUIFILM TEARS Place 1 drop into both eyes as needed for dry eyes (Q 6 HRS). What changed:    when to take this  reasons to take this   sodium chloride 0.65 % Soln nasal spray Commonly known as:  OCEAN Place 1 spray into both nostrils as needed for congestion.   tamsulosin 0.4 MG Caps capsule Commonly known as:  FLOMAX Take 1 capsule (0.4 mg total) by mouth daily after breakfast. What changed:  when to take this   traMADol 50 MG tablet Commonly known as:  ULTRAM Take 1 tablet (50 mg total) by mouth every 6 (six) hours as needed for moderate pain.   umeclidinium-vilanterol 62.5-25 MCG/INH Aepb Commonly known as:  ANORO ELLIPTA Inhale 1 puff into the lungs daily.   VITRON-C 65-125 MG Tabs Generic drug:  Iron-Vitamin C Take 1 tablet by mouth daily.       If you experience worsening of your admission symptoms,  develop shortness of breath, life threatening emergency, suicidal or homicidal thoughts you must seek medical attention immediately by calling 911 or calling your MD immediately  if symptoms less severe.  You Must read complete instructions/literature along with all the possible adverse reactions/side effects for all the Medicines you take and that have been prescribed to you. Take any new Medicines after you have completely understood and accept all the possible adverse reactions/side effects.   Please note  You were cared for by a hospitalist during your hospital stay. If you have any questions about your discharge medications or the care you received while you were in the hospital after you are discharged, you can call the unit and asked to speak with the hospitalist on call if the hospitalist that took care of you is not available. Once you are discharged, your primary care physician will handle any further medical issues. Please note that NO REFILLS  for any discharge medications will be authorized once you are discharged, as it is imperative that you return to your primary care physician (or establish a relationship with a primary care physician if you do not have one) for your aftercare needs so that they can reassess your need for medications and monitor your lab values. Today   SUBJECTIVE   Patient doing well no complaints  wants to go home  VITAL SIGNS:  Blood pressure (!) 143/92, pulse 98, temperature 97.6 F (36.4 C), temperature source Oral, resp. rate 20, height 5\' 11"  (1.803 m), weight 86.2 kg, SpO2 99 %.  I/O:    Intake/Output Summary (Last 24 hours) at 12/06/2017 1041 Last data filed at 12/06/2017 1028 Gross per 24 hour  Intake 1130 ml  Output 1975 ml  Net -845 ml    PHYSICAL EXAMINATION:  GENERAL:  82 y.o.-year-old patient lying in the bed with no acute distress.  EYES: Pupils equal, round, reactive to light and accommodation. No scleral icterus.pallor+ Extraocular  muscles intact.  HEENT: Head atraumatic, normocephalic. Oropharynx and nasopharynx clear.  NECK:  Supple, no jugular venous distention. No thyroid enlargement, no tenderness.  LUNGS: Normal breath sounds bilaterally, no wheezing, rales,rhonchi or crepitation. No use of accessory muscles of respiration.  CARDIOVASCULAR: S1, S2 normal. No murmurs, rubs, or gallops.  ABDOMEN: Soft, non-tender, non-distended. Bowel sounds present. No organomegaly or mass.  EXTREMITIES: No pedal edema, cyanosis, or clubbing.  NEUROLOGIC: Cranial nerves II through XII are intact. Muscle strength 4/5 in all extremities. Sensation intact. Gait not checked. Generalized weakness PSYCHIATRIC: The patient is alert and oriented x 3.  SKIN: No obvious rash, lesion, or ulcer.   DATA REVIEW:   CBC  Recent Labs  Lab 12/05/17 0524 12/05/17 2040  WBC 5.6  --   HGB 6.8* 7.6*  HCT 22.5* 24.8*  PLT 207  --     Chemistries  Recent Labs  Lab 12/04/17 1606 12/05/17 0524  NA 143 142  K 3.8 4.0  CL 113* 116*  CO2 18* 20*  GLUCOSE 112* 101*  BUN 31* 28*  CREATININE 3.38* 3.10*  CALCIUM 8.4* 8.0*  AST 12*  --   ALT 10  --   ALKPHOS 87  --   BILITOT 0.5  --     Microbiology Results   No results found for this or any previous visit (from the past 240 hour(s)).  RADIOLOGY:  Dg Chest 2 View  Result Date: 12/04/2017 CLINICAL DATA:  Weakness EXAM: CHEST - 2 VIEW COMPARISON:  08/26/2017 FINDINGS: Mild linear opacity at the bases. Large lung volumes with diaphragm flattening. There is no edema, consolidation, effusion, or pneumothorax. Normal heart size. Mild aortic tortuosity. IMPRESSION: Minor atelectasis. Electronically Signed   By: Monte Fantasia M.D.   On: 12/04/2017 16:41     Management plans discussed with the patient, family and they are in agreement.  CODE STATUS:     Code Status Orders  (From admission, onward)         Start     Ordered   12/04/17 2201  Do not attempt resuscitation (DNR)   Continuous    Question Answer Comment  In the event of cardiac or respiratory ARREST Do not call a "code blue"   In the event of cardiac or respiratory ARREST Do not perform Intubation, CPR, defibrillation or ACLS   In the event of cardiac or respiratory ARREST Use medication by any route, position, wound care, and other measures to relive pain and suffering. May  use oxygen, suction and manual treatment of airway obstruction as needed for comfort.      12/04/17 2200        Code Status History    Date Active Date Inactive Code Status Order ID Comments User Context   08/19/2017 1524 09/07/2017 1447 Full Code [id number]  Flora Lipps Inpatient   08/15/2017 0358 08/19/2017 1458 Full Code [id number]  Arta Silence, MD Inpatient   08/13/2017 1704 08/15/2017 0358 Full Code [id number]  Demetrios Loll, MD Inpatient   08/04/2015 1541 08/04/2015 2020 Full Code [id number]  Yolonda Kida, MD Inpatient    Advance Directive Documentation     Most Recent Value  Type of Advance Directive  Living will  Pre-existing out of facility DNR order (yellow form or pink MOST form)  -  "MOST" Form in Place?  -      TOTAL TIME TAKING CARE OF THIS PATIENT: *40* minutes.    Fritzi Mandes M.D on 12/06/2017 at 10:41 AM  Between 7am to 6pm - Pager - [phone] After 6pm go to [email or web address] - password [email or web address] [facility]  Office  [phone]  CC: Primary care physician; Rusty Aus, MD

## 2017-12-06 NOTE — Care Management (Signed)
[facility] cannot take this patient for home health.

## 2017-12-06 NOTE — Plan of Care (Signed)

## 2017-12-06 NOTE — Plan of Care (Signed)
  Problem: Clinical Measurements: Goal: Ability to maintain clinical measurements within normal limits will improve Outcome: Progressing   Problem: Safety: Goal: Ability to remain free from injury will improve Outcome: Progressing   

## 2017-12-08 LAB — TYPE AND SCREEN
ABO/RH(D): B POS
ANTIBODY SCREEN: NEGATIVE
Unit division: 0
Unit division: 0
Unit division: 0
Unit division: 0

## 2017-12-08 LAB — BPAM RBC
BLOOD PRODUCT EXPIRATION DATE: 201912092359
BLOOD PRODUCT EXPIRATION DATE: 201912092359
Blood Product Expiration Date: 201912092359
Blood Product Expiration Date: 201912102359
ISSUE DATE / TIME: 201911201808
ISSUE DATE / TIME: 201911202357
ISSUE DATE / TIME: 201911211516
UNIT TYPE AND RH: 7300
UNIT TYPE AND RH: 7300
Unit Type and Rh: 7300
Unit Type and Rh: 7300

## 2017-12-14 LAB — ABO/RH: ABO/RH(D): B POS

## 2017-12-23 ENCOUNTER — Other Ambulatory Visit: Payer: Self-pay | Admitting: Internal Medicine

## 2017-12-23 DIAGNOSIS — D649 Anemia, unspecified: Secondary | ICD-10-CM

## 2017-12-23 DIAGNOSIS — R972 Elevated prostate specific antigen [PSA]: Secondary | ICD-10-CM

## 2017-12-30 ENCOUNTER — Ambulatory Visit
Admission: RE | Admit: 2017-12-30 | Discharge: 2017-12-30 | Disposition: A | Discharge status: Home / Self Care | Payer: Medicare Other | Source: Ambulatory Visit | Attending: Internal Medicine | Admitting: Internal Medicine

## 2017-12-30 DIAGNOSIS — R933 Abnormal findings on diagnostic imaging of other parts of digestive tract: Secondary | ICD-10-CM | POA: Insufficient documentation

## 2017-12-30 DIAGNOSIS — N281 Cyst of kidney, acquired: Secondary | ICD-10-CM | POA: Diagnosis not present

## 2017-12-30 DIAGNOSIS — I700 Atherosclerosis of aorta: Secondary | ICD-10-CM | POA: Insufficient documentation

## 2017-12-30 DIAGNOSIS — R972 Elevated prostate specific antigen [PSA]: Secondary | ICD-10-CM | POA: Diagnosis present

## 2017-12-30 DIAGNOSIS — I714 Abdominal aortic aneurysm, without rupture: Secondary | ICD-10-CM | POA: Diagnosis not present

## 2017-12-30 DIAGNOSIS — K5730 Diverticulosis of large intestine without perforation or abscess without bleeding: Secondary | ICD-10-CM | POA: Insufficient documentation

## 2017-12-30 DIAGNOSIS — M899 Disorder of bone, unspecified: Secondary | ICD-10-CM | POA: Diagnosis not present

## 2017-12-30 DIAGNOSIS — N400 Enlarged prostate without lower urinary tract symptoms: Secondary | ICD-10-CM | POA: Insufficient documentation

## 2017-12-30 DIAGNOSIS — D649 Anemia, unspecified: Secondary | ICD-10-CM | POA: Insufficient documentation

## 2018-07-10 ENCOUNTER — Emergency Department: Payer: Medicare Other

## 2018-07-10 ENCOUNTER — Observation Stay
Admission: EM | Admit: 2018-07-10 | Discharge: 2018-07-12 | Disposition: A | Discharge status: Home / Self Care | Payer: Medicare Other | Attending: Internal Medicine | Admitting: Internal Medicine

## 2018-07-10 ENCOUNTER — Other Ambulatory Visit: Payer: Self-pay

## 2018-07-10 DIAGNOSIS — I350 Nonrheumatic aortic (valve) stenosis: Secondary | ICD-10-CM | POA: Diagnosis not present

## 2018-07-10 DIAGNOSIS — Z9181 History of falling: Secondary | ICD-10-CM | POA: Diagnosis not present

## 2018-07-10 DIAGNOSIS — I10 Essential (primary) hypertension: Secondary | ICD-10-CM | POA: Diagnosis present

## 2018-07-10 DIAGNOSIS — I739 Peripheral vascular disease, unspecified: Secondary | ICD-10-CM | POA: Insufficient documentation

## 2018-07-10 DIAGNOSIS — J449 Chronic obstructive pulmonary disease, unspecified: Secondary | ICD-10-CM | POA: Insufficient documentation

## 2018-07-10 DIAGNOSIS — Z888 Allergy status to other drugs, medicaments and biological substances status: Secondary | ICD-10-CM | POA: Insufficient documentation

## 2018-07-10 DIAGNOSIS — N1330 Unspecified hydronephrosis: Secondary | ICD-10-CM

## 2018-07-10 DIAGNOSIS — I471 Supraventricular tachycardia: Principal | ICD-10-CM | POA: Insufficient documentation

## 2018-07-10 DIAGNOSIS — E782 Mixed hyperlipidemia: Secondary | ICD-10-CM | POA: Insufficient documentation

## 2018-07-10 DIAGNOSIS — Z886 Allergy status to analgesic agent status: Secondary | ICD-10-CM | POA: Diagnosis not present

## 2018-07-10 DIAGNOSIS — K7460 Unspecified cirrhosis of liver: Secondary | ICD-10-CM | POA: Diagnosis present

## 2018-07-10 DIAGNOSIS — R262 Difficulty in walking, not elsewhere classified: Secondary | ICD-10-CM | POA: Insufficient documentation

## 2018-07-10 DIAGNOSIS — M6281 Muscle weakness (generalized): Secondary | ICD-10-CM | POA: Insufficient documentation

## 2018-07-10 DIAGNOSIS — I129 Hypertensive chronic kidney disease with stage 1 through stage 4 chronic kidney disease, or unspecified chronic kidney disease: Secondary | ICD-10-CM | POA: Diagnosis not present

## 2018-07-10 DIAGNOSIS — I499 Cardiac arrhythmia, unspecified: Secondary | ICD-10-CM | POA: Diagnosis present

## 2018-07-10 DIAGNOSIS — Z1159 Encounter for screening for other viral diseases: Secondary | ICD-10-CM | POA: Insufficient documentation

## 2018-07-10 DIAGNOSIS — G608 Other hereditary and idiopathic neuropathies: Secondary | ICD-10-CM | POA: Insufficient documentation

## 2018-07-10 DIAGNOSIS — K5641 Fecal impaction: Secondary | ICD-10-CM | POA: Diagnosis present

## 2018-07-10 DIAGNOSIS — Z79899 Other long term (current) drug therapy: Secondary | ICD-10-CM | POA: Diagnosis not present

## 2018-07-10 DIAGNOSIS — N184 Chronic kidney disease, stage 4 (severe): Secondary | ICD-10-CM | POA: Insufficient documentation

## 2018-07-10 DIAGNOSIS — I2510 Atherosclerotic heart disease of native coronary artery without angina pectoris: Secondary | ICD-10-CM | POA: Diagnosis not present

## 2018-07-10 DIAGNOSIS — R2689 Other abnormalities of gait and mobility: Secondary | ICD-10-CM | POA: Insufficient documentation

## 2018-07-10 DIAGNOSIS — Z8673 Personal history of transient ischemic attack (TIA), and cerebral infarction without residual deficits: Secondary | ICD-10-CM | POA: Diagnosis not present

## 2018-07-10 DIAGNOSIS — N132 Hydronephrosis with renal and ureteral calculous obstruction: Secondary | ICD-10-CM | POA: Insufficient documentation

## 2018-07-10 DIAGNOSIS — I495 Sick sinus syndrome: Secondary | ICD-10-CM | POA: Insufficient documentation

## 2018-07-10 DIAGNOSIS — R109 Unspecified abdominal pain: Secondary | ICD-10-CM | POA: Diagnosis present

## 2018-07-10 DIAGNOSIS — Z8249 Family history of ischemic heart disease and other diseases of the circulatory system: Secondary | ICD-10-CM | POA: Insufficient documentation

## 2018-07-10 DIAGNOSIS — Z66 Do not resuscitate: Secondary | ICD-10-CM | POA: Insufficient documentation

## 2018-07-10 HISTORY — DX: Unspecified cirrhosis of liver: K74.60

## 2018-07-10 LAB — TROPONIN I (HIGH SENSITIVITY): Troponin I (High Sensitivity): 17 ng/L (ref <18)

## 2018-07-10 LAB — CBC WITH DIFFERENTIAL/PLATELET
Abs Immature Granulocytes: 0.06 10*3/uL (ref 0.00–0.07)
Basophils Absolute: 0 10*3/uL (ref 0.0–0.1)
Basophils Relative: 0 %
Eosinophils Absolute: 0 10*3/uL (ref 0.0–0.5)
Eosinophils Relative: 0 %
HCT: 36.9 % — ABNORMAL LOW (ref 39.0–52.0)
Hemoglobin: 11.7 g/dL — ABNORMAL LOW (ref 13.0–17.0)
Immature Granulocytes: 1 %
Lymphocytes Relative: 5 %
Lymphs Abs: 0.5 10*3/uL — ABNORMAL LOW (ref 0.7–4.0)
MCH: 28.1 pg (ref 26.0–34.0)
MCHC: 31.7 g/dL (ref 30.0–36.0)
MCV: 88.7 fL (ref 80.0–100.0)
Monocytes Absolute: 0.3 10*3/uL (ref 0.1–1.0)
Monocytes Relative: 4 %
Neutro Abs: 8.4 10*3/uL — ABNORMAL HIGH (ref 1.7–7.7)
Neutrophils Relative %: 90 %
Platelets: 191 10*3/uL (ref 150–400)
RBC: 4.16 MIL/uL — ABNORMAL LOW (ref 4.22–5.81)
RDW: 15.1 % (ref 11.5–15.5)
WBC: 9.4 10*3/uL (ref 4.0–10.5)
nRBC: 0 % (ref 0.0–0.2)

## 2018-07-10 LAB — COMPREHENSIVE METABOLIC PANEL
ALT: 12 U/L (ref 0–44)
AST: 13 U/L — ABNORMAL LOW (ref 15–41)
Albumin: 4 g/dL (ref 3.5–5.0)
Alkaline Phosphatase: 100 U/L (ref 38–126)
Anion gap: 10 (ref 5–15)
BUN: 35 mg/dL — ABNORMAL HIGH (ref 8–23)
CO2: 20 mmol/L — ABNORMAL LOW (ref 22–32)
Calcium: 9 mg/dL (ref 8.9–10.3)
Chloride: 109 mmol/L (ref 98–111)
Creatinine, Ser: 3.52 mg/dL — ABNORMAL HIGH (ref 0.61–1.24)
GFR calc Af Amer: 18 mL/min — ABNORMAL LOW (ref >60)
GFR calc non Af Amer: 15 mL/min — ABNORMAL LOW (ref >60)
Glucose, Bld: 136 mg/dL — ABNORMAL HIGH (ref 70–99)
Potassium: 4.9 mmol/L (ref 3.5–5.1)
Sodium: 139 mmol/L (ref 135–145)
Total Bilirubin: 0.6 mg/dL (ref 0.3–1.2)
Total Protein: 7.6 g/dL (ref 6.5–8.1)

## 2018-07-10 LAB — URINALYSIS, COMPLETE (UACMP) WITH MICROSCOPIC
Bacteria, UA: NONE SEEN
Bilirubin Urine: NEGATIVE
Glucose, UA: NEGATIVE mg/dL
Ketones, ur: NEGATIVE mg/dL
Leukocytes,Ua: NEGATIVE
Nitrite: NEGATIVE
Protein, ur: 100 mg/dL — AB
Specific Gravity, Urine: 1.01 (ref 1.005–1.030)
Squamous Epithelial / LPF: NONE SEEN (ref 0–5)
pH: 6 (ref 5.0–8.0)

## 2018-07-10 LAB — MAGNESIUM: Magnesium: 1.7 mg/dL (ref 1.7–2.4)

## 2018-07-10 LAB — LIPASE, BLOOD: Lipase: 51 U/L (ref 11–51)

## 2018-07-10 MED ORDER — MORPHINE SULFATE (PF) 4 MG/ML IV SOLN
4.0000 mg | Freq: Once | INTRAVENOUS | Status: AC
  Administered 2018-07-10: 4 mg via INTRAVENOUS
  Filled 2018-07-10: qty 1

## 2018-07-10 MED ORDER — ONDANSETRON HCL 4 MG/2ML IJ SOLN
4.0000 mg | Freq: Once | INTRAMUSCULAR | Status: AC
  Administered 2018-07-10: 4 mg via INTRAVENOUS
  Filled 2018-07-10: qty 2

## 2018-07-10 MED ORDER — LABETALOL HCL 5 MG/ML IV SOLN: 10.0000 mg | INTRAVENOUS | Status: DC | PRN

## 2018-07-10 NOTE — ED Provider Notes (Signed)
[facility] Emergency Department Provider Note  ____________________________________________   None    (approximate)  I have reviewed the triage vital signs and the nursing notes.   HISTORY  Chief Complaint Abdominal Pain and Back Pain   HPI [name] is a 83 y.o. male who presents to the emergency department for treatment and evaluation of nominal pain.  Pain started approximately 11 AM.  Patient denies ever having had similar pain in the past.  He states that he felt like he needed to use the restroom, when he got up out of his chair and took a few steps he began to have abdominal pain and decided to go sit back down.  He states that initially he had a little feeling of gas and bloating, but has been unable to pass gas since the onset of pain.  Last bowel movement was 2 days ago.  He has not noticed any blood or had any excessively dark stools that he has seen.  No alleviating measures attempted prior to arrival.     Past Medical History:  Diagnosis Date  . Aortic stenosis   . CAD (coronary artery disease)   . Cirrhosis ([place])   . CKD (chronic kidney disease) stage 4, GFR 15-29 ml/min (HCC)   . Hereditary and idiopathic peripheral neuropathy   . HTN (hypertension)   . Pulmonary nodules   . Stroke [facility])     Patient Active Problem List   Diagnosis Date Noted  . CKD (chronic kidney disease), stage IV ([place]) 07/10/2018  . Cirrhosis ([place]) 07/10/2018  . CAD (coronary artery disease) 07/10/2018  . Fecal impaction ([place]) 07/10/2018  . Arrhythmia 07/10/2018  . GI bleed 12/04/2017  . Wheezing   . Left foot drop   . Anemia of chronic disease   . Acute blood loss anemia   . Urinary frequency   . 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 Acute brainstem infarction ([place])   . Generalized OA   . History of gastritis   . History of GI bleed   . Chronic obstructive pulmonary disease ([place])   . AKI (acute kidney injury) ([place])   . Perianal  abscess   . CVA (cerebral vascular accident) ([place]) 08/14/2017  . Acute renal failure superimposed on chronic kidney disease ([place]) 08/13/2017  . Thoracic aortic aneurysm without rupture ([place]) 12/04/2016  . Essential hypertension 12/04/2016    Past Surgical History:  Procedure Laterality Date  . CARDIAC CATHETERIZATION N/A 08/04/2015   Procedure: Right/Left Heart Cath and Coronary Angiography;  Surgeon: Yolonda Kida, MD;  Location: [place] [facility];  Service: Cardiovascular;  Laterality: N/A;    Prior to Admission medications   Medication Sig Start Date End Date Taking? Authorizing Provider  acetaminophen (TYLENOL) 325 MG tablet Take 1-2 tablets (325-650 mg total) by mouth every 4 (four) hours as needed for mild pain. 09/05/17  Yes Love, Ivan Anchors, PA-C  ALPRAZolam Duanne Moron) 0.5 MG tablet Take 0.5 mg by mouth 2 (two) times daily as needed for anxiety.    Yes [provider]  amLODipine (NORVASC) 5 MG tablet Take 5 mg by mouth daily. 06/27/18  Yes [provider]  atorvastatin (LIPITOR) 40 MG tablet Take 1 tablet (40 mg total) by mouth daily at 6 PM. Patient taking differently: Take 40 mg by mouth at bedtime.  09/06/17  Yes Angiulli, Lavon Paganini, PA-C  calcitRIOL (ROCALTROL) 0.25 MCG capsule Take 1 capsule (0.25 mcg total) by mouth every Monday,  Wednesday, and Friday. 08/21/17  Yes Gladstone Lighter, MD  citalopram (CELEXA) 20 MG tablet Take 1.5 tablets (30 mg total) by mouth daily. 09/06/17  Yes Angiulli, Lavon Paganini, PA-C  ferrous gluconate (FERGON) 324 MG tablet Take 324 mg by mouth daily with breakfast. 03/26/18  Yes [provider]  pantoprazole (PROTONIX) 40 MG tablet Take 1 tablet (40 mg total) by mouth daily. Patient taking differently: Take 40 mg by mouth 2 (two) times daily.  09/06/17 09/06/18 Yes Angiulli, Lavon Paganini, PA-C  polyvinyl alcohol (LIQUIFILM TEARS) 1.4 % ophthalmic solution Place 1 drop into both eyes as needed for dry eyes (Q 6 HRS). Patient taking  differently: Place 1 drop into both eyes every 6 (six) hours as needed for dry eyes.  09/05/17  Yes Love, Ivan Anchors, PA-C  sodium chloride (OCEAN) 0.65 % SOLN nasal spray Place 1 spray into both nostrils as needed for congestion. 09/05/17  Yes Love, Ivan Anchors, PA-C  tamsulosin (FLOMAX) 0.4 MG CAPS capsule Take 1 capsule (0.4 mg total) by mouth daily after breakfast. Patient taking differently: Take 0.4 mg by mouth at bedtime.  09/06/17 09/06/18 Yes Angiulli, Lavon Paganini, PA-C  traMADol (ULTRAM) 50 MG tablet Take 1 tablet (50 mg total) by mouth every 6 (six) hours as needed for moderate pain. 09/06/17  Yes Angiulli, Lavon Paganini, PA-C  umeclidinium-vilanterol (ANORO ELLIPTA) 62.5-25 MCG/INH AEPB Inhale 1 puff into the lungs daily.   Yes [provider]  venlafaxine XR (EFFEXOR-XR) 37.5 MG 24 hr capsule Take 37.5 mg by mouth daily. 04/08/18 04/08/19 Yes [provider]  metoprolol tartrate (LOPRESSOR) 25 MG tablet Take 0.5 tablets (12.5 mg total) by mouth 2 (two) times daily. 07/12/18   Dustin Flock, MD  polyethylene glycol (MIRALAX / GLYCOLAX) 17 g packet Take 17 g by mouth daily. 07/12/18   Dustin Flock, MD    Allergies Ace inhibitors, Nsaids, and Isosorbide nitrate  Family History  Problem Relation Age of Onset  . Hypertension Mother   . Heart attack Father     Social History Social History   Tobacco Use  . Smoking status: Never Smoker  . Smokeless tobacco: Current User    Types: Chew  Substance Use Topics  . Alcohol use: No    Frequency: Never  . Drug use: Never    Review of Systems  Constitutional: No fever/chills Eyes: No visual changes. ENT: No sore throat. Cardiovascular: Denies chest pain. Respiratory: Denies shortness of breath. Gastrointestinal: Positive for abdominal pain.  As before nausea, no vomiting.  No diarrhea.  No constipation. Genitourinary: Negative for dysuria. Musculoskeletal: Negative for back pain. Skin: Negative for rash. Neurological:  Negative for headaches, focal weakness or numbness. ____________________________________________   PHYSICAL EXAM:  VITAL SIGNS: ED Triage Vitals  Enc Vitals Group     BP 07/10/18 1922 (!) 155/101     Pulse Rate 07/10/18 1922 (!) 101     Resp 07/10/18 1922 19     Temp --      Temp src --      SpO2 07/10/18 1922 97 %     Weight 07/10/18 1859 193 lb (87.5 kg)     Height 07/10/18 1859 5\' 10"  (1.778 m)     Head Circumference --      Peak Flow --      Pain Score 07/10/18 1859 7     Pain Loc --      Pain Edu? --      Excl. in [place]? --  Constitutional: Alert and oriented.  Chronically ill appearing and in no acute distress. Eyes: Conjunctivae are normal. Head: Atraumatic. Nose: No congestion/rhinnorhea. Mouth/Throat: Mucous membranes are moist.  Oropharynx non-erythematous. Neck: No stridor.   Cardiovascular: Normal rate, regular rhythm. Grossly normal heart sounds.  Good peripheral circulation. Respiratory: Normal respiratory effort.  No retractions. Lungs CTAB. Gastrointestinal: Soft.  Diffuse right mid and lower quadrant tenderness.. No distention. No abdominal bruits.  Bowel sounds absent in the right and left lower quadrant.  Tinkling bowel sounds heard in the epigastric area. Musculoskeletal: No lower extremity tenderness nor edema.  No joint effusions. Neurologic:  Normal speech and language. No gross focal neurologic deficits are appreciated. No gait instability. Skin:  Skin is warm, dry and intact. No rash noted. Psychiatric: Mood and affect are normal. Speech and behavior are normal.  ____________________________________________   LABS (all labs ordered are listed, but only abnormal results are displayed)  Labs Reviewed  CBC WITH DIFFERENTIAL/PLATELET - Abnormal; Notable for the following components:      Result Value   RBC 4.16 (*)    Hemoglobin 11.7 (*)    HCT 36.9 (*)    Neutro Abs 8.4 (*)    Lymphs Abs 0.5 (*)    All other components within normal limits   COMPREHENSIVE METABOLIC PANEL - Abnormal; Notable for the following components:   CO2 20 (*)    Glucose, Bld 136 (*)    BUN 35 (*)    Creatinine, Ser 3.52 (*)    AST 13 (*)    GFR calc non Af Amer 15 (*)    GFR calc Af Amer 18 (*)    All other components within normal limits  URINALYSIS, COMPLETE (UACMP) WITH MICROSCOPIC - Abnormal; Notable for the following components:   Color, Urine STRAW (*)    APPearance CLEAR (*)    Hgb urine dipstick SMALL (*)    Protein, ur 100 (*)    All other components within normal limits  BASIC METABOLIC PANEL - Abnormal; Notable for the following components:   CO2 19 (*)    Glucose, Bld 108 (*)    BUN 37 (*)    Creatinine, Ser 3.92 (*)    Calcium 8.7 (*)    GFR calc non Af Amer 14 (*)    GFR calc Af Amer 16 (*)    All other components within normal limits  CBC - Abnormal; Notable for the following components:   RBC 4.15 (*)    Hemoglobin 11.5 (*)    HCT 36.0 (*)    All other components within normal limits  NOVEL CORONAVIRUS, NAA (HOSPITAL ORDER, SEND-OUT TO REF LAB)  LIPASE, BLOOD  TROPONIN I (HIGH SENSITIVITY)  MAGNESIUM   ____________________________________________  EKG  ED ECG REPORT I, Aldo Sondgeroth, FNP-BC personally viewed and interpreted this ECG.   Date: 07/10/2018  EKG Time: 1900  Rate: 101  Rhythm: RBBB  Axis: normal  Intervals:none  ST&T Change: no ST elevation  ____________________________________________  RADIOLOGY  ED MD interpretation:  Pending  Official radiology report(s): No results found.  ____________________________________________   PROCEDURES  Procedure(s) performed: None  Procedures  Critical Care performed: Care relinquished to Dr. Burlene Arnt  ____________________________________________   INITIAL IMPRESSION / ASSESSMENT AND PLAN / ED COURSE    83 year old male presenting to the emergency department with onset of right mid and lower abdominal pain at approximately 11 AM while he was  sitting watching television.  Patient denies vomiting but states that he has been somewhat nauseated.  He has been unable to pass gas or have a bowel movement since onset of pain.  He denies a history of small bowel obstruction or other intra-abdominal pathology.   Because of his CKD, he will not receive CT of the abdomen and pelvis with contrast.  He will have a noncontrast study.  Care relinquished to Dr. Burlene Arnt prior to CT resultus. He will follow the patient to disposition.     ____________________________________________   FINAL CLINICAL IMPRESSION(S) / ED DIAGNOSES  Final diagnoses:  Abdominal pain, unspecified abdominal location  Hydronephrosis, unspecified hydronephrosis type  Tachy-brady syndrome [facility])     ED Discharge Orders         Ordered    metoprolol tartrate (LOPRESSOR) 25 MG tablet  2 times daily     07/12/18 1244    polyethylene glycol (MIRALAX / GLYCOLAX) 17 g packet  Daily     07/12/18 1246    Increase activity slowly     07/12/18 1246    Diet - low sodium heart healthy     07/12/18 1246           Note:  This document was prepared using Dragon voice recognition software and may include unintentional dictation errors.    Victorino Dike, FNP 07/12/18 1409    Schuyler Amor, MD 07/17/18 [phone]

## 2018-07-10 NOTE — ED Provider Notes (Signed)
ED ECG REPORT I, Hinda Kehr, the attending physician, personally viewed and interpreted this ECG.  Date: 07/10/2018 EKG Time: 23:11 Rate: 98 Rhythm: sinus with first degree AV block QRS Axis: RBBB Intervals: normal ST/T Wave abnormalities: Non-specific ST segment / T-wave changes, but no clear evidence of acute ischemia. Narrative Interpretation: no definitive evidence of acute ischemia; does not meet STEMI criteria.     Hinda Kehr, MD 07/11/18 [phone]

## 2018-07-10 NOTE — ED Notes (Signed)
Patient transported to CT 

## 2018-07-10 NOTE — ED Triage Notes (Signed)
Pt arrives via ems from home. Ems reports pt started having RLQ pain around 11am today, also c/o back pain in center of back, not radiating. 7/10 pain at this time. Also c/o nausea and dizziness. EMS also reports last BM 2 days ago, prior to that stool loose and dark in color. Pt a&o x 4 on arrival. NAD noted at this time.

## 2018-07-10 NOTE — H&P (Signed)
[facility] NAME: [name]    MR#:  [id number]  DATE OF BIRTH:  1936/01/08  DATE OF ADMISSION:  07/10/2018  PRIMARY CARE PHYSICIAN: Rusty Aus, MD   REQUESTING/REFERRING PHYSICIAN: Burlene Arnt, MD  CHIEF COMPLAINT:   Chief Complaint  Patient presents with  . Abdominal Pain  . Back Pain    HISTORY OF PRESENT ILLNESS:  [name]  is a 83 y.o. male who presents with chief complaint as above.  Patient presents the ED with a complaint of abdominal pain.  He is found on imaging to have large stool burden with fecal impaction.  ED physician attempted disimpaction.  When he did so the patient's heart rate rose to close to 200 with an abnormal rhythm on the monitor.  Disimpaction attempt was aborted and hospitalist were called for admission  PAST MEDICAL HISTORY:   Past Medical History:  Diagnosis Date  . Aortic stenosis   . CAD (coronary artery disease)   . Cirrhosis ([place])   . CKD (chronic kidney disease) stage 4, GFR 15-29 ml/min (HCC)   . Hereditary and idiopathic peripheral neuropathy   . HTN (hypertension)   . Pulmonary nodules   . Stroke [facility])      PAST SURGICAL HISTORY:   Past Surgical History:  Procedure Laterality Date  . CARDIAC CATHETERIZATION N/A 08/04/2015   Procedure: Right/Left Heart Cath and Coronary Angiography;  Surgeon: Yolonda Kida, MD;  Location: [place] [facility];  Service: Cardiovascular;  Laterality: N/A;     SOCIAL HISTORY:   Social History   Tobacco Use  . Smoking status: Never Smoker  . Smokeless tobacco: Current User    Types: Chew  Substance Use Topics  . Alcohol use: No    Frequency: Never     FAMILY HISTORY:   Family History  Problem Relation Age of Onset  . Hypertension Mother   . Heart attack Father      DRUG ALLERGIES:   Allergies  Allergen Reactions  . Ace Inhibitors Other (See Comments)    Renal Failure   . Nsaids Other (See Comments)    GI Bleed   . Isosorbide Nitrate Nausea Only    Headache    MEDICATIONS AT HOME:   Prior to Admission medications   Medication Sig Start Date End Date Taking? Authorizing Provider  acetaminophen (TYLENOL) 325 MG tablet Take 1-2 tablets (325-650 mg total) by mouth every 4 (four) hours as needed for mild pain. 09/05/17  Yes Love, Ivan Anchors, PA-C  ALPRAZolam Duanne Moron) 0.5 MG tablet Take 0.5 mg by mouth 2 (two) times daily as needed for anxiety.    Yes [provider]  amLODipine (NORVASC) 5 MG tablet Take 5 mg by mouth daily. 06/27/18  Yes [provider]  atorvastatin (LIPITOR) 40 MG tablet Take 1 tablet (40 mg total) by mouth daily at 6 PM. Patient taking differently: Take 40 mg by mouth at bedtime.  09/06/17  Yes Angiulli, Lavon Paganini, PA-C  calcitRIOL (ROCALTROL) 0.25 MCG capsule Take 1 capsule (0.25 mcg total) by mouth every Monday, Wednesday, and Friday. 08/21/17  Yes Gladstone Lighter, MD  citalopram (CELEXA) 20 MG tablet Take 1.5 tablets (30 mg total) by mouth daily. 09/06/17  Yes Angiulli, Lavon Paganini, PA-C  ferrous gluconate (FERGON) 324 MG tablet Take 324 mg by mouth daily with breakfast. 03/26/18  Yes [provider]  pantoprazole (PROTONIX) 40 MG tablet Take 1 tablet (40 mg total) by mouth daily.  Patient taking differently: Take 40 mg by mouth 2 (two) times daily.  09/06/17 09/06/18 Yes Angiulli, Lavon Paganini, PA-C  polyvinyl alcohol (LIQUIFILM TEARS) 1.4 % ophthalmic solution Place 1 drop into both eyes as needed for dry eyes (Q 6 HRS). Patient taking differently: Place 1 drop into both eyes every 6 (six) hours as needed for dry eyes.  09/05/17  Yes Love, Ivan Anchors, PA-C  sodium chloride (OCEAN) 0.65 % SOLN nasal spray Place 1 spray into both nostrils as needed for congestion. 09/05/17  Yes Love, Ivan Anchors, PA-C  tamsulosin (FLOMAX) 0.4 MG CAPS capsule Take 1 capsule (0.4 mg total) by mouth daily after breakfast. Patient taking differently: Take 0.4 mg by mouth at bedtime.  09/06/17 09/06/18  Yes Angiulli, Lavon Paganini, PA-C  traMADol (ULTRAM) 50 MG tablet Take 1 tablet (50 mg total) by mouth every 6 (six) hours as needed for moderate pain. 09/06/17  Yes Angiulli, Lavon Paganini, PA-C  umeclidinium-vilanterol (ANORO ELLIPTA) 62.5-25 MCG/INH AEPB Inhale 1 puff into the lungs daily.   Yes [provider]  venlafaxine XR (EFFEXOR-XR) 37.5 MG 24 hr capsule Take 37.5 mg by mouth daily. 04/08/18 04/08/19 Yes [provider]    REVIEW OF SYSTEMS:  Review of Systems  Constitutional: Negative for chills, fever, malaise/fatigue and weight loss.  HENT: Negative for ear pain, hearing loss and tinnitus.   Eyes: Negative for blurred vision, double vision, pain and redness.  Respiratory: Negative for cough, hemoptysis and shortness of breath.   Cardiovascular: Negative for chest pain, palpitations, orthopnea and leg swelling.  Gastrointestinal: Positive for abdominal pain. Negative for constipation, diarrhea, nausea and vomiting.  Genitourinary: Negative for dysuria, frequency and hematuria.  Musculoskeletal: Negative for back pain, joint pain and neck pain.  Skin:       No acne, rash, or lesions  Neurological: Negative for dizziness, tremors, focal weakness and weakness.  Endo/Heme/Allergies: Negative for polydipsia. Does not bruise/bleed easily.  Psychiatric/Behavioral: Negative for depression. The patient is not nervous/anxious and does not have insomnia.      VITAL SIGNS:   Vitals:   07/10/18 2100 07/10/18 2200 07/10/18 2230 07/10/18 2300  BP: (!) 164/105 (!) 157/99 (!) 141/87 (!) 160/89  Pulse: 96 98 97   Resp: 15 11 15 15   Temp:  (!) 97.5 F (36.4 C)    TempSrc:  Oral    SpO2: 95% 98% 98%   Weight:      Height:       Wt Readings from Last 3 Encounters:  07/10/18 87.5 kg  12/04/17 86.2 kg  08/28/17 93.2 kg    PHYSICAL EXAMINATION:  Physical Exam  Vitals reviewed. Constitutional: He is oriented to person, place, and time. He appears well-developed and  well-nourished. No distress.  HENT:  Head: Normocephalic and atraumatic.  Mouth/Throat: Oropharynx is clear and moist.  Eyes: Pupils are equal, round, and reactive to light. Conjunctivae and EOM are normal. No scleral icterus.  Neck: Normal range of motion. Neck supple. No JVD present. No thyromegaly present.  Cardiovascular: Normal rate, regular rhythm and intact distal pulses. Exam reveals no gallop and no friction rub.  No murmur heard. Respiratory: Effort normal and breath sounds normal. No respiratory distress. He has no wheezes. He has no rales.  GI: Soft. Bowel sounds are normal. He exhibits no distension. There is abdominal tenderness.  Musculoskeletal: Normal range of motion.        General: No edema.     Comments: No arthritis, no gout  Lymphadenopathy:  He has no cervical adenopathy.  Neurological: He is alert and oriented to person, place, and time. No cranial nerve deficit.  No dysarthria, no aphasia  Skin: Skin is warm and dry. No rash noted. No erythema.  Psychiatric: He has a normal mood and affect. His behavior is normal. Judgment and thought content normal.    LABORATORY PANEL:   CBC Recent Labs  Lab 07/10/18 1959  WBC 9.4  HGB 11.7*  HCT 36.9*  PLT 191   ------------------------------------------------------------------------------------------------------------------  Chemistries  Recent Labs  Lab 07/10/18 1959 07/10/18 2217  NA 139  --   K 4.9  --   CL 109  --   CO2 20*  --   GLUCOSE 136*  --   BUN 35*  --   CREATININE 3.52*  --   CALCIUM 9.0  --   MG  --  1.7  AST 13*  --   ALT 12  --   ALKPHOS 100  --   BILITOT 0.6  --    ------------------------------------------------------------------------------------------------------------------  Cardiac Enzymes No results for input(s): TROPONINI in the last 168 hours. ------------------------------------------------------------------------------------------------------------------  RADIOLOGY:   Ct Abdomen Pelvis Wo Contrast  Addendum Date: 07/10/2018   ADDENDUM REPORT: 07/10/2018 21:29 ADDENDUM: There is an infrarenal abdominal aortic aneurysm measuring approximately 3 cm. Recommend followup by ultrasound in 3 years. This recommendation follows ACR consensus guidelines: White Paper of the ACR Incidental Findings Committee II on Vascular Findings. J Am Coll Radiol 2013; 10:789-794. Aortic aneurysm NOS (ICD10-I71.9) Electronically Signed   By: Constance Holster M.D.   On: 07/10/2018 21:29   Result Date: 07/10/2018 CLINICAL DATA:  Right lower quadrant pain. EXAM: CT ABDOMEN AND PELVIS WITHOUT CONTRAST TECHNIQUE: Multidetector CT imaging of the abdomen and pelvis was performed following the standard protocol without IV contrast. COMPARISON:  CT dated September 06, 2017 FINDINGS: Lower chest: No acute abnormality. Hepatobiliary: The liver surface is somewhat nodular raising concern for underlying cirrhosis. There is no discrete hepatic mass. The gallbladder is unremarkable. Pancreas: Unremarkable. No pancreatic ductal dilatation or surrounding inflammatory changes. Spleen: The spleen is enlarged measures approximately 12.6 cm craniocaudad. Adrenals/Urinary Tract: There are innumerable cortical cysts involving both kidneys. There is new right-sided hydronephrosis without evidence of a radiopaque obstructing stone. There is fat stranding about the right kidney. There are few punctate nonobstructing stones involving both kidneys. Stomach/Bowel: There is sigmoid diverticulosis without definite CT evidence of diverticulitis. Appendix is normal. The stomach is unremarkable. There is no evidence of a small-bowel obstruction. Vascular/Lymphatic: There are atherosclerotic changes throughout the abdominal aorta. The infrarenal abdominal aorta measures up to approximately 3 cm in diameter. There are no pathologically enlarged retroperitoneal lymph nodes. Reproductive: Prostate gland is significantly enlarged. There is  likely a left-sided varicocele. Other: There are bilateral fat containing inguinal hernias. Musculoskeletal: There are degenerative changes through the out the lumbar spine chronic greatest at the L4-L5 and L5-S1 levels. Again noted is grade 1 anterolisthesis of L4 on L5. IMPRESSION: 1. New mild right-sided hydroureteronephrosis without evidence of a radiopaque obstructing stone. Findings could be secondary to a recently passed stone versus an ascending urinary tract infection. Correlation with urinalysis is recommended. No radiopaque stones identified within the urinary bladder. 2. There are multiple punctate nonobstructing stones involving both kidneys. Again noted is polycystic kidney disease bilaterally. 3. Cirrhosis with stigmata of portal hypertension including splenomegaly. 4. Sigmoid diverticulosis without CT evidence of diverticulitis. 5.  Aortic Atherosclerosis (ICD10-I70.0). Electronically Signed: By: Constance Holster M.D. On: 07/10/2018 21:15  EKG:   Orders placed or performed during the hospital encounter of 07/10/18  . EKG 12-Lead  . EKG 12-Lead    IMPRESSION AND PLAN:  Principal Problem:   Arrhythmia -unclear etiology.  We will monitor with telemetry tonight and get an echocardiogram and cardiology consult Active Problems:   Fecal impaction (HCC) -p.o. lactulose, glycerin suppository.   Essential hypertension -continue home meds   Chronic obstructive pulmonary disease (HCC) -continue home inhalers   CAD (coronary artery disease) -continue home meds   CKD (chronic kidney disease), stage IV (HCC) -baseline, avoid nephrotoxins and monitor   Cirrhosis ([place]) -avoid hepatotoxins  Chart review performed and case discussed with ED provider. Labs, imaging and/or ECG reviewed by provider and discussed with patient/family. Management plans discussed with the patient and/or family.  COVID-19 status: Test pending  DVT PROPHYLAXIS: SubQ heparin  GI PROPHYLAXIS:  None  ADMISSION  STATUS: Observation  CODE STATUS: DNR Code Status History    Date Active Date Inactive Code Status Order ID Comments User Context   12/04/2017 2201 12/06/2017 1643 DNR [id number]  Vaughan Basta, MD Inpatient   08/19/2017 1524 09/07/2017 1447 Full Code [id number]  Flora Lipps Inpatient   08/15/2017 0358 08/19/2017 1458 Full Code [id number]  Arta Silence, MD Inpatient   08/13/2017 1704 08/15/2017 0358 Full Code [id number]  Demetrios Loll, MD Inpatient   08/04/2015 1541 08/04/2015 2020 Full Code [id number]  Yolonda Kida, MD Inpatient   Advance Care Planning Activity    Questions for Most Recent Historical Code Status (Order [id number])    Question Answer Comment   In the event of cardiac or respiratory ARREST Do not call a "code blue"    In the event of cardiac or respiratory ARREST Do not perform Intubation, CPR, defibrillation or ACLS    In the event of cardiac or respiratory ARREST Use medication by any route, position, wound care, and other measures to relive pain and suffering. May use oxygen, suction and manual treatment of airway obstruction as needed for comfort.       TOTAL TIME TAKING CARE OF THIS PATIENT: 40 minutes.   This patient was evaluated in the context of the global COVID-19 pandemic, which necessitated consideration that the patient might be at risk for infection with the SARS-CoV-2 virus that causes COVID-19. Institutional protocols and algorithms that pertain to the evaluation of patients at risk for COVID-19 are in a state of rapid change based on information released by regulatory bodies including the CDC and federal and state organizations. These policies and algorithms were followed to the best of this provider's knowledge to date during the patient's care at this facility.  Ethlyn Daniels 07/10/2018, 11:21 PM  [facility]  Office  [phone]  CC: Primary care physician; Rusty Aus, MD  Note:  This document was prepared using  Dragon voice recognition software and may include unintentional dictation errors.

## 2018-07-10 NOTE — ED Notes (Addendum)
ED TO INPATIENT HANDOFF REPORT  ED Nurse Name and Phone #: Karena Addison [phone]  J Name/Age/Gender [name] 83 y.o. male Room/Bed: ED16A/ED16A  Code Status   Code Status: Prior  Home/SNF/Other Home Patient oriented to: self, place, time and situation Is this baseline? Yes   Triage Complete: Triage complete  Chief Complaint Abd Pain  Triage Note Pt arrives via ems from home. Ems reports pt started having RLQ pain around 11am today, also c/o back pain in center of back, not radiating. 7/10 pain at this time. Also c/o nausea and dizziness. EMS also reports last BM 2 days ago, prior to that stool loose and dark in color. Pt a&o x 4 on arrival. NAD noted at this time.    Allergies Allergies  Allergen Reactions  . Ace Inhibitors Other (See Comments)    Renal Failure   . Nsaids Other (See Comments)    GI Bleed  . Isosorbide Nitrate Nausea Only    Headache    Level of Care/Admitting Diagnosis ED Disposition    ED Disposition Condition [facility]: [place] [100120]  Level of Care: Telemetry [5]  Covid Evaluation: Screening Protocol (No Symptoms)  Diagnosis: Arrhythmia [696789]  Admitting Physician: Lance Coon [[id number]]  Attending Physician: Lance Coon [[id number]]  Bed request comments: 2a  PT Class (Do Not Modify): Observation [104]  PT Acc Code (Do Not Modify): Observation [10022]       B Medical/Surgery History Past Medical History:  Diagnosis Date  . Aortic stenosis   . CAD (coronary artery disease)   . Cirrhosis ([place])   . CKD (chronic kidney disease) stage 4, GFR 15-29 ml/min (HCC)   . Hereditary and idiopathic peripheral neuropathy   . HTN (hypertension)   . Pulmonary nodules   . Stroke [facility])    Past Surgical History:  Procedure Laterality Date  . CARDIAC CATHETERIZATION N/A 08/04/2015   Procedure: Right/Left Heart Cath and Coronary Angiography;  Surgeon: Yolonda Kida, MD;  Location: [place] [facility];   Service: Cardiovascular;  Laterality: N/A;     A IV Location/Drains/Wounds Patient Lines/Drains/Airways Status   Active Line/Drains/Airways    Name:   Placement date:   Placement time:   Site:   Days:   Peripheral IV 07/10/18 Left Antecubital   07/10/18    1854    Antecubital   less than 1   Wound / Incision (Open or Dehisced) 08/15/17 Non-pressure wound;Other (Comment) Buttocks Left;Lower Boil    08/15/17    1524    Buttocks   329          Intake/Output Last 24 hours No intake or output data in the 24 hours ending 07/10/18 2345  Labs/Imaging Results for orders placed or performed during the hospital encounter of 07/10/18 (from the past 48 hour(s))  CBC with Differential     Status: Abnormal   Collection Time: 07/10/18  7:59 PM  Result Value Ref Range   WBC 9.4 4.0 - 10.5 K/uL   RBC 4.16 (L) 4.22 - 5.81 MIL/uL   Hemoglobin 11.7 (L) 13.0 - 17.0 g/dL   HCT 36.9 (L) 39.0 - 52.0 %   MCV 88.7 80.0 - 100.0 fL   MCH 28.1 26.0 - 34.0 pg   MCHC 31.7 30.0 - 36.0 g/dL   RDW 15.1 11.5 - 15.5 %   Platelets 191 150 - 400 K/uL   nRBC 0.0 0.0 - 0.2 %   Neutrophils Relative % 90 %  Neutro Abs 8.4 (H) 1.7 - 7.7 K/uL   Lymphocytes Relative 5 %   Lymphs Abs 0.5 (L) 0.7 - 4.0 K/uL   Monocytes Relative 4 %   Monocytes Absolute 0.3 0.1 - 1.0 K/uL   Eosinophils Relative 0 %   Eosinophils Absolute 0.0 0.0 - 0.5 K/uL   Basophils Relative 0 %   Basophils Absolute 0.0 0.0 - 0.1 K/uL   Immature Granulocytes 1 %   Abs Immature Granulocytes 0.06 0.00 - 0.07 K/uL    Comment: Performed at [facility], [address] [place], [place] [address]  Comprehensive metabolic panel     Status: Abnormal   Collection Time: 07/10/18  7:59 PM  Result Value Ref Range   Sodium 139 135 - 145 mmol/L   Potassium 4.9 3.5 - 5.1 mmol/L   Chloride 109 98 - 111 mmol/L   CO2 20 (L) 22 - 32 mmol/L   Glucose, Bld 136 (H) 70 - 99 mg/dL   BUN 35 (H) 8 - 23 mg/dL   Creatinine, Ser 3.52 (H) 0.61 - 1.24 mg/dL    Calcium 9.0 8.9 - 10.3 mg/dL   Total Protein 7.6 6.5 - 8.1 g/dL   Albumin 4.0 3.5 - 5.0 g/dL   AST 13 (L) 15 - 41 U/L   ALT 12 0 - 44 U/L   Alkaline Phosphatase 100 38 - 126 U/L   Total Bilirubin 0.6 0.3 - 1.2 mg/dL   GFR calc non Af Amer 15 (L) >60 mL/min   GFR calc Af Amer 18 (L) >60 mL/min   Anion gap 10 5 - 15    Comment: Performed at [facility], [address] [place], [place] [address]  Lipase, blood     Status: None   Collection Time: 07/10/18  7:59 PM  Result Value Ref Range   Lipase 51 11 - 51 U/L    Comment: Performed at [facility], [address] [place], [place] [address]  Urinalysis, Complete w Microscopic     Status: Abnormal   Collection Time: 07/10/18  7:59 PM  Result Value Ref Range   Color, Urine STRAW (A) YELLOW   APPearance CLEAR (A) CLEAR   Specific Gravity, Urine 1.010 1.005 - 1.030   pH 6.0 5.0 - 8.0   Glucose, UA NEGATIVE NEGATIVE mg/dL   Hgb urine dipstick SMALL (A) NEGATIVE   Bilirubin Urine NEGATIVE NEGATIVE   Ketones, ur NEGATIVE NEGATIVE mg/dL   Protein, ur 100 (A) NEGATIVE mg/dL   Nitrite NEGATIVE NEGATIVE   Leukocytes,Ua NEGATIVE NEGATIVE   RBC / HPF 0-5 0 - 5 RBC/hpf   WBC, UA 0-5 0 - 5 WBC/hpf   Bacteria, UA NONE SEEN NONE SEEN   Squamous Epithelial / LPF NONE SEEN 0 - 5   Mucus PRESENT     Comment: Performed at [facility], [place], [place] [address]  Troponin I (High Sensitivity)     Status: None   Collection Time: 07/10/18 10:17 PM  Result Value Ref Range   Troponin I (High Sensitivity) 17 <18 ng/L    Comment: (NOTE) Elevated high sensitivity troponin I (hsTnI) values and significant  changes across serial measurements may suggest ACS but many other  chronic and acute conditions are known to elevate hsTnI results.  Refer to the "Links" section for chest pain algorithms and additional  guidance. Performed at [facility], [address] [place],  [address]    Magnesium     Status: None  Collection Time: 07/10/18 10:17 PM  Result Value Ref Range   Magnesium 1.7 1.7 - 2.4 mg/dL    Comment: Performed at [facility], [address] [place], [place] [address]   Ct Abdomen Pelvis Wo Contrast  Addendum Date: 07/10/2018   ADDENDUM REPORT: 07/10/2018 21:29 ADDENDUM: There is an infrarenal abdominal aortic aneurysm measuring approximately 3 cm. Recommend followup by ultrasound in 3 years. This recommendation follows ACR consensus guidelines: White Paper of the ACR Incidental Findings Committee II on Vascular Findings. J Am Coll Radiol 2013; 10:789-794. Aortic aneurysm NOS (ICD10-I71.9) Electronically Signed   By: Constance Holster M.D.   On: 07/10/2018 21:29   Result Date: 07/10/2018 CLINICAL DATA:  Right lower quadrant pain. EXAM: CT ABDOMEN AND PELVIS WITHOUT CONTRAST TECHNIQUE: Multidetector CT imaging of the abdomen and pelvis was performed following the standard protocol without IV contrast. COMPARISON:  CT dated September 06, 2017 FINDINGS: Lower chest: No acute abnormality. Hepatobiliary: The liver surface is somewhat nodular raising concern for underlying cirrhosis. There is no discrete hepatic mass. The gallbladder is unremarkable. Pancreas: Unremarkable. No pancreatic ductal dilatation or surrounding inflammatory changes. Spleen: The spleen is enlarged measures approximately 12.6 cm craniocaudad. Adrenals/Urinary Tract: There are innumerable cortical cysts involving both kidneys. There is new right-sided hydronephrosis without evidence of a radiopaque obstructing stone. There is fat stranding about the right kidney. There are few punctate nonobstructing stones involving both kidneys. Stomach/Bowel: There is sigmoid diverticulosis without definite CT evidence of diverticulitis. Appendix is normal. The stomach is unremarkable. There is no evidence of a small-bowel obstruction. Vascular/Lymphatic: There are atherosclerotic changes throughout the  abdominal aorta. The infrarenal abdominal aorta measures up to approximately 3 cm in diameter. There are no pathologically enlarged retroperitoneal lymph nodes. Reproductive: Prostate gland is significantly enlarged. There is likely a left-sided varicocele. Other: There are bilateral fat containing inguinal hernias. Musculoskeletal: There are degenerative changes through the out the lumbar spine chronic greatest at the L4-L5 and L5-S1 levels. Again noted is grade 1 anterolisthesis of L4 on L5. IMPRESSION: 1. New mild right-sided hydroureteronephrosis without evidence of a radiopaque obstructing stone. Findings could be secondary to a recently passed stone versus an ascending urinary tract infection. Correlation with urinalysis is recommended. No radiopaque stones identified within the urinary bladder. 2. There are multiple punctate nonobstructing stones involving both kidneys. Again noted is polycystic kidney disease bilaterally. 3. Cirrhosis with stigmata of portal hypertension including splenomegaly. 4. Sigmoid diverticulosis without CT evidence of diverticulitis. 5.  Aortic Atherosclerosis (ICD10-I70.0). Electronically Signed: By: Constance Holster M.D. On: 07/10/2018 21:15    Pending Labs Unresulted Labs (From admission, onward)    Start     Ordered   07/10/18 2249  Novel Coronavirus,NAA,(SEND-OUT TO REF LAB - TAT 24-48 hrs); Hosp Order  (Asymptomatic Patients Labs)  ONCE - STAT,   STAT    Question:  Rule Out  Answer:  Yes   07/10/18 2248   Signed and Held  CBC  (heparin)  Once,   R    Comments: Baseline for heparin therapy IF NOT ALREADY DRAWN.  Notify MD if PLT < 100 K.    Signed and Held   Signed and Held  Creatinine, serum  (heparin)  Once,   R    Comments: Baseline for heparin therapy IF NOT ALREADY DRAWN.    Signed and Held   Signed and Held  Basic metabolic panel  Tomorrow morning,   R     Signed and Held   Signed and Held  CBC  Tomorrow morning,   R     Signed and Held           Vitals/Pain Today's Vitals   07/10/18 2100 07/10/18 2200 07/10/18 2230 07/10/18 2300  BP: (!) 164/105 (!) 157/99 (!) 141/87 (!) 160/89  Pulse: 96 98 97   Resp: 15 11 15 15   Temp:  (!) 97.5 F (36.4 C)    TempSrc:  Oral    SpO2: 95% 98% 98%   Weight:      Height:      PainSc:        Isolation Precautions No active isolations  Medications Medications  morphine 4 MG/ML injection 4 mg (4 mg Intravenous Given 07/10/18 2021)  ondansetron (ZOFRAN) injection 4 mg (4 mg Intravenous Given 07/10/18 2020)    Mobility walks with person assist High fall risk   Focused Assessments    R Recommendations: See Admitting Provider Note  Report given to: Beau Fanny, RN

## 2018-07-10 NOTE — ED Provider Notes (Addendum)
-----------------------------------------   9:34 PM on 07/10/2018 -----------------------------------------  Patient with abdominal pain while he was going to the bathroom today states he has been constipated for a few days.  CT scan shows some hydronephrosis and he has some slight urine blood, it could be that he passed a small stone.  Creatinine is actually at his baseline.  Blood work is reassuring in all respects otherwise given his baseline.  CT scan is reassuring he does have some hydronephrosis which could have been from a passed stone however there is no other acute pathology noted.  I do see however on his CT scan that he has a large amount of stool in his rectal vault and my suspicion is constipation is playing some role in this he also has a large amount of stool in the ascending colon and descending colon.  He states that this is a feeling he gets when he is constipated.  I will disimpact him and we will give an enema see if he feels better after that.  Otherwise reassuring work-up and I think that constipation is what he was complaining of he is actually going to have a bowel movement when this happened and had crampy pain in his abdomen is quite benign at this time with no tenderness noted.  ----------------------------------------- 10:04 PM on 07/10/2018 -----------------------------------------  With patient consent I did attempt a disimpaction, I was unable to extract any stool at this very high in the colon.  However, while this was going on patient's heart rate went up to 200 appear to be wide-complex on the monitor.  Then rapidly came down.  His EKG done immediately thereafter shows a sinus rhythm with a borderline peak long PR and a right bundle which does not appear to be significantly different from prior, he does have resolution of ST changes in the lateral leads which he used to have.  I am very concerned about this dysrhythmia, 200 is likely faster than sinus tach, I will talk to  the hospitalist about observing this patient to ensure that he does not have any more dysrhythmia obviously this was provoked, but it was of concern   Schuyler Amor, MD 07/10/18 2135    Schuyler Amor, MD 07/10/18 2206

## 2018-07-11 LAB — BASIC METABOLIC PANEL
Anion gap: 9 (ref 5–15)
BUN: 37 mg/dL — ABNORMAL HIGH (ref 8–23)
CO2: 19 mmol/L — ABNORMAL LOW (ref 22–32)
Calcium: 8.7 mg/dL — ABNORMAL LOW (ref 8.9–10.3)
Chloride: 110 mmol/L (ref 98–111)
Creatinine, Ser: 3.92 mg/dL — ABNORMAL HIGH (ref 0.61–1.24)
GFR calc Af Amer: 16 mL/min — ABNORMAL LOW (ref >60)
GFR calc non Af Amer: 14 mL/min — ABNORMAL LOW (ref >60)
Glucose, Bld: 108 mg/dL — ABNORMAL HIGH (ref 70–99)
Potassium: 4.8 mmol/L (ref 3.5–5.1)
Sodium: 138 mmol/L (ref 135–145)

## 2018-07-11 LAB — CBC
HCT: 36 % — ABNORMAL LOW (ref 39.0–52.0)
Hemoglobin: 11.5 g/dL — ABNORMAL LOW (ref 13.0–17.0)
MCH: 27.7 pg (ref 26.0–34.0)
MCHC: 31.9 g/dL (ref 30.0–36.0)
MCV: 86.7 fL (ref 80.0–100.0)
Platelets: 200 10*3/uL (ref 150–400)
RBC: 4.15 MIL/uL — ABNORMAL LOW (ref 4.22–5.81)
RDW: 15.2 % (ref 11.5–15.5)
WBC: 9.6 10*3/uL (ref 4.0–10.5)
nRBC: 0 % (ref 0.0–0.2)

## 2018-07-11 MED ORDER — AMLODIPINE BESYLATE 5 MG PO TABS
5.0000 mg | ORAL_TABLET | Freq: Every day | ORAL | Status: DC
  Administered 2018-07-11 – 2018-07-12 (×2): 5 mg via ORAL
  Filled 2018-07-11 (×2): qty 1

## 2018-07-11 MED ORDER — ATORVASTATIN CALCIUM 20 MG PO TABS
40.0000 mg | ORAL_TABLET | Freq: Every day | ORAL | Status: DC
  Administered 2018-07-11: 40 mg via ORAL
  Filled 2018-07-11: qty 2

## 2018-07-11 MED ORDER — VENLAFAXINE HCL ER 37.5 MG PO CP24
37.5000 mg | ORAL_CAPSULE | Freq: Every day | ORAL | Status: DC
  Administered 2018-07-11 – 2018-07-12 (×2): 37.5 mg via ORAL
  Filled 2018-07-11 (×2): qty 1

## 2018-07-11 MED ORDER — CITALOPRAM HYDROBROMIDE 20 MG PO TABS
30.0000 mg | ORAL_TABLET | Freq: Every day | ORAL | Status: DC
  Administered 2018-07-11: 30 mg via ORAL
  Filled 2018-07-11: qty 2

## 2018-07-11 MED ORDER — FLEET ENEMA 7-19 GM/118ML RE ENEM
1.0000 | ENEMA | Freq: Once | RECTAL | Status: AC
  Administered 2018-07-11: 1 via RECTAL

## 2018-07-11 MED ORDER — CITALOPRAM HYDROBROMIDE 20 MG PO TABS
20.0000 mg | ORAL_TABLET | Freq: Every day | ORAL | Status: DC
  Administered 2018-07-12: 20 mg via ORAL
  Filled 2018-07-11: qty 1

## 2018-07-11 MED ORDER — ONDANSETRON HCL 4 MG PO TABS: 4.0000 mg | ORAL_TABLET | Freq: Four times a day (QID) | ORAL | Status: DC | PRN

## 2018-07-11 MED ORDER — ONDANSETRON HCL 4 MG/2ML IJ SOLN: 4.0000 mg | Freq: Four times a day (QID) | INTRAMUSCULAR | Status: DC | PRN

## 2018-07-11 MED ORDER — TAMSULOSIN HCL 0.4 MG PO CAPS
0.4000 mg | ORAL_CAPSULE | Freq: Every day | ORAL | Status: DC
  Administered 2018-07-11: 0.4 mg via ORAL
  Filled 2018-07-11: qty 1

## 2018-07-11 MED ORDER — LACTULOSE 10 GM/15ML PO SOLN
10.0000 g | Freq: Once | ORAL | Status: AC
  Administered 2018-07-11: 10 g via ORAL
  Filled 2018-07-11: qty 30

## 2018-07-11 MED ORDER — MORPHINE SULFATE (PF) 2 MG/ML IV SOLN: 2.0000 mg | INTRAVENOUS | Status: DC | PRN

## 2018-07-11 MED ORDER — OXYCODONE HCL 5 MG PO TABS
5.0000 mg | ORAL_TABLET | ORAL | Status: DC | PRN
  Administered 2018-07-11 (×2): 5 mg via ORAL
  Filled 2018-07-11 (×2): qty 1

## 2018-07-11 MED ORDER — ACETAMINOPHEN 325 MG PO TABS
650.0000 mg | ORAL_TABLET | Freq: Four times a day (QID) | ORAL | Status: DC | PRN
  Administered 2018-07-11: 650 mg via ORAL
  Filled 2018-07-11: qty 2

## 2018-07-11 MED ORDER — LACTULOSE 10 GM/15ML PO SOLN
20.0000 g | Freq: Three times a day (TID) | ORAL | Status: DC
  Administered 2018-07-11 – 2018-07-12 (×3): 20 g via ORAL
  Filled 2018-07-11 (×3): qty 30

## 2018-07-11 MED ORDER — FERROUS GLUCONATE 324 (38 FE) MG PO TABS
324.0000 mg | ORAL_TABLET | Freq: Every day | ORAL | Status: DC
  Administered 2018-07-12: 324 mg via ORAL
  Filled 2018-07-11: qty 1

## 2018-07-11 MED ORDER — ACETAMINOPHEN 650 MG RE SUPP: 650.0000 mg | Freq: Four times a day (QID) | RECTAL | Status: DC | PRN

## 2018-07-11 MED ORDER — GLYCERIN (LAXATIVE) 2.1 G RE SUPP
1.0000 | Freq: Once | RECTAL | Status: AC
  Administered 2018-07-11: 1 via RECTAL
  Filled 2018-07-11: qty 1

## 2018-07-11 MED ORDER — ALPRAZOLAM 0.5 MG PO TABS: 0.5000 mg | ORAL_TABLET | Freq: Two times a day (BID) | ORAL | Status: DC | PRN

## 2018-07-11 MED ORDER — HEPARIN SODIUM (PORCINE) 5000 UNIT/ML IJ SOLN
5000.0000 [IU] | Freq: Three times a day (TID) | INTRAMUSCULAR | Status: DC
  Administered 2018-07-11 – 2018-07-12 (×4): 5000 [IU] via SUBCUTANEOUS
  Filled 2018-07-11 (×4): qty 1

## 2018-07-11 MED ORDER — PANTOPRAZOLE SODIUM 40 MG PO TBEC
40.0000 mg | DELAYED_RELEASE_TABLET | Freq: Two times a day (BID) | ORAL | Status: DC
  Administered 2018-07-11 – 2018-07-12 (×3): 40 mg via ORAL
  Filled 2018-07-11 (×3): qty 1

## 2018-07-11 NOTE — Plan of Care (Signed)
  Problem: Clinical Measurements: Goal: Ability to maintain clinical measurements within normal limits will improve Outcome: Progressing Goal: Will remain free from infection Outcome: Progressing   Problem: Elimination: Goal: Will not experience complications related to bowel motility Outcome: Progressing   Problem: Pain Managment: Goal: General experience of comfort will improve Outcome: Progressing   Problem: Safety: Goal: Ability to remain free from injury will improve Outcome: Progressing   Problem: Skin Integrity: Goal: Risk for impaired skin integrity will decrease Outcome: Progressing

## 2018-07-11 NOTE — Progress Notes (Signed)
Pts daughter, Almyra Free, updated by this RN at 1405 on pts status and plan of care.

## 2018-07-11 NOTE — Consult Note (Addendum)
[facility] Cardiology  CARDIOLOGY CONSULT NOTE  Patient ID: SPARSH CALLENS MRN: [id number] DOB/AGE: [birth year] 83 y.o.  Admit date: 07/10/2018 Primary Physician Dr. Sabra Heck Primary Cardiologist Dr. Clayborn Bigness Reason for Consultation Arrhythmia  HPI:  [name] is a 83 y.o. with a past medical history of coronary artery disease as seen by cath from 2017, however, he was not a candidate for PCI or bypass, now on medical management, HTN, HLD, and COPD, who presented to the ED with abdominal pain and constipation. A fecal disimpaction procedure was attempted, although his heart rate increased to nearly ~150 BPM and the procedure was aborted. The cardiology team has been consulted to evaluate the patient for his arrhythmia. Saved strip reviewed and it appears he may have gone into SVT during his disimpaction procedure. He has remained on telemetry since he was admitted and he has not had any sustained arrhythmias since admission. The patient states that other than his continued abdominal discomfort and constipation, he feels well and has no chest pain, shortness of breath, or palpitations. He was not aware of when his heart rate increased as he felt no different during that time.   Review of systems complete and found to be negative unless listed above   Past Medical History:  Diagnosis Date  . Aortic stenosis   . CAD (coronary artery disease)   . Cirrhosis ([place])   . CKD (chronic kidney disease) stage 4, GFR 15-29 ml/min (HCC)   . Hereditary and idiopathic peripheral neuropathy   . HTN (hypertension)   . Pulmonary nodules   . Stroke [facility])     Past Surgical History:  Procedure Laterality Date  . CARDIAC CATHETERIZATION N/A 08/04/2015   Procedure: Right/Left Heart Cath and Coronary Angiography;  Surgeon: Yolonda Kida, MD;  Location: [place] [facility];  Service: Cardiovascular;  Laterality: N/A;    Medications Prior to Admission  Medication Sig Dispense Refill Last Dose  . acetaminophen  (TYLENOL) 325 MG tablet Take 1-2 tablets (325-650 mg total) by mouth every 4 (four) hours as needed for mild pain.   prn at Unknown time  . ALPRAZolam (XANAX) 0.5 MG tablet Take 0.5 mg by mouth 2 (two) times daily as needed for anxiety.    prn at prn  . amLODipine (NORVASC) 5 MG tablet Take 5 mg by mouth daily.   07/10/2018 at Unknown time  . atorvastatin (LIPITOR) 40 MG tablet Take 1 tablet (40 mg total) by mouth daily at 6 PM. (Patient taking differently: Take 40 mg by mouth at bedtime. ) 30 tablet 2 07/09/2018 at Unknown time  . calcitRIOL (ROCALTROL) 0.25 MCG capsule Take 1 capsule (0.25 mcg total) by mouth every Monday, Wednesday, and Friday. 20 capsule 1 07/09/2018 at Unknown time  . citalopram (CELEXA) 20 MG tablet Take 1.5 tablets (30 mg total) by mouth daily. 30 tablet 0 07/10/2018 at Unknown time  . ferrous gluconate (FERGON) 324 MG tablet Take 324 mg by mouth daily with breakfast.   07/10/2018 at Unknown time  . pantoprazole (PROTONIX) 40 MG tablet Take 1 tablet (40 mg total) by mouth daily. (Patient taking differently: Take 40 mg by mouth 2 (two) times daily. ) 30 tablet 1 07/10/2018 at Unknown time  . polyvinyl alcohol (LIQUIFILM TEARS) 1.4 % ophthalmic solution Place 1 drop into both eyes as needed for dry eyes (Q 6 HRS). (Patient taking differently: Place 1 drop into both eyes every 6 (six) hours as needed for dry eyes. ) 15 mL 0 prn at prn  .  sodium chloride (OCEAN) 0.65 % SOLN nasal spray Place 1 spray into both nostrils as needed for congestion.  0 prn at prn  . tamsulosin (FLOMAX) 0.4 MG CAPS capsule Take 1 capsule (0.4 mg total) by mouth daily after breakfast. (Patient taking differently: Take 0.4 mg by mouth at bedtime. ) 30 capsule 11 07/10/2018 at Unknown time  . traMADol (ULTRAM) 50 MG tablet Take 1 tablet (50 mg total) by mouth every 6 (six) hours as needed for moderate pain. 30 tablet 0 prn at prn  . umeclidinium-vilanterol (ANORO ELLIPTA) 62.5-25 MCG/INH AEPB Inhale 1 puff into the  lungs daily.   07/10/2018 at Unknown time  . venlafaxine XR (EFFEXOR-XR) 37.5 MG 24 hr capsule Take 37.5 mg by mouth daily.   07/10/2018 at Unknown time   Social History   Socioeconomic History  . Marital status: Married    Spouse name: Not on file  . Number of children: Not on file  . Years of education: Not on file  . Highest education level: Not on file  Occupational History  . Not on file  Social Needs  . Financial resource strain: Not on file  . Food insecurity    Worry: Not on file    Inability: Not on file  . Transportation needs    Medical: Not on file    Non-medical: Not on file  Tobacco Use  . Smoking status: Never Smoker  . Smokeless tobacco: Current User    Types: Chew  Substance and Sexual Activity  . Alcohol use: No    Frequency: Never  . Drug use: Never  . Sexual activity: Not on file  Lifestyle  . Physical activity    Days per week: Not on file    Minutes per session: Not on file  . Stress: Not on file  Relationships  . Social Herbalist on phone: Not on file    Gets together: Not on file    Attends religious service: Not on file    Active member of club or organization: Not on file    Attends meetings of clubs or organizations: Not on file    Relationship status: Not on file  . Intimate partner violence    Fear of current or ex partner: Not on file    Emotionally abused: Not on file    Physically abused: Not on file    Forced sexual activity: Not on file  Other Topics Concern  . Not on file  Social History Narrative  . Not on file    Family History  Problem Relation Age of Onset  . Hypertension Mother   . Heart attack Father     Review of systems complete and found to be negative unless listed above    PHYSICAL EXAM  General: Well developed, well nourished, in no acute distress HEENT:  Normocephalic and atramatic Neck:  No JVD.  Lungs: Clear bilaterally to auscultation and percussion. Heart: HRRR. Normal S1 and S2 without  gallops or murmurs.  Abdomen: Abdomen soft  Extremities: No clubbing, cyanosis or edema.   Neuro: Alert and oriented X 3. Psych:  Good affect, responds appropriately  Labs:   Lab Results  Component Value Date   WBC 9.6 07/11/2018   HGB 11.5 (L) 07/11/2018   HCT 36.0 (L) 07/11/2018   MCV 86.7 07/11/2018   PLT 200 07/11/2018    Recent Labs  Lab 07/10/18 1959 07/11/18 0442  NA 139 138  K 4.9 4.8  CL 109 110  CO2 20* 19*  BUN 35* 37*  CREATININE 3.52* 3.92*  CALCIUM 9.0 8.7*  PROT 7.6  --   BILITOT 0.6  --   ALKPHOS 100  --   ALT 12  --   AST 13*  --   GLUCOSE 136* 108*   Lab Results  Component Value Date   CKTOTAL 101 02/07/2011   CKMB 2.7 02/07/2011   TROPONINI 0.15 (HH) 12/04/2017    Lab Results  Component Value Date   CHOL 157 08/15/2017   CHOL 136 02/08/2011   Lab Results  Component Value Date   HDL 24 (L) 08/15/2017   HDL 23 (L) 02/08/2011   Lab Results  Component Value Date   LDLCALC 92 08/15/2017   LDLCALC 87 02/08/2011   Lab Results  Component Value Date   TRIG 204 (H) 08/15/2017   TRIG 130 02/08/2011   Lab Results  Component Value Date   CHOLHDL 6.5 08/15/2017   No results found for: LDLDIRECT    Radiology: Ct Abdomen Pelvis Wo Contrast  Addendum Date: 07/10/2018   ADDENDUM REPORT: 07/10/2018 21:29 ADDENDUM: There is an infrarenal abdominal aortic aneurysm measuring approximately 3 cm. Recommend followup by ultrasound in 3 years. This recommendation follows ACR consensus guidelines: White Paper of the ACR Incidental Findings Committee II on Vascular Findings. J Am Coll Radiol 2013; 10:789-794. Aortic aneurysm NOS (ICD10-I71.9) Electronically Signed   By: Constance Holster M.D.   On: 07/10/2018 21:29   Result Date: 07/10/2018 CLINICAL DATA:  Right lower quadrant pain. EXAM: CT ABDOMEN AND PELVIS WITHOUT CONTRAST TECHNIQUE: Multidetector CT imaging of the abdomen and pelvis was performed following the standard protocol without IV contrast.  COMPARISON:  CT dated September 06, 2017 FINDINGS: Lower chest: No acute abnormality. Hepatobiliary: The liver surface is somewhat nodular raising concern for underlying cirrhosis. There is no discrete hepatic mass. The gallbladder is unremarkable. Pancreas: Unremarkable. No pancreatic ductal dilatation or surrounding inflammatory changes. Spleen: The spleen is enlarged measures approximately 12.6 cm craniocaudad. Adrenals/Urinary Tract: There are innumerable cortical cysts involving both kidneys. There is new right-sided hydronephrosis without evidence of a radiopaque obstructing stone. There is fat stranding about the right kidney. There are few punctate nonobstructing stones involving both kidneys. Stomach/Bowel: There is sigmoid diverticulosis without definite CT evidence of diverticulitis. Appendix is normal. The stomach is unremarkable. There is no evidence of a small-bowel obstruction. Vascular/Lymphatic: There are atherosclerotic changes throughout the abdominal aorta. The infrarenal abdominal aorta measures up to approximately 3 cm in diameter. There are no pathologically enlarged retroperitoneal lymph nodes. Reproductive: Prostate gland is significantly enlarged. There is likely a left-sided varicocele. Other: There are bilateral fat containing inguinal hernias. Musculoskeletal: There are degenerative changes through the out the lumbar spine chronic greatest at the L4-L5 and L5-S1 levels. Again noted is grade 1 anterolisthesis of L4 on L5. IMPRESSION: 1. New mild right-sided hydroureteronephrosis without evidence of a radiopaque obstructing stone. Findings could be secondary to a recently passed stone versus an ascending urinary tract infection. Correlation with urinalysis is recommended. No radiopaque stones identified within the urinary bladder. 2. There are multiple punctate nonobstructing stones involving both kidneys. Again noted is polycystic kidney disease bilaterally. 3. Cirrhosis with stigmata of  portal hypertension including splenomegaly. 4. Sigmoid diverticulosis without CT evidence of diverticulitis. 5.  Aortic Atherosclerosis (ICD10-I70.0). Electronically Signed: By: Constance Holster M.D. On: 07/10/2018 21:15    ASSESSMENT AND PLAN:  Mr. Rinks is an 83 year male with a history of coronary artery disease, medically managed, who  had an episode of rapid heart rate and possible arrhythmia during an attempted fecal disimpaction procedure. Saved strip shows likely SVT with rate of 140. He's had no sustained arrhthymias overnight since he was admitted to the telemetry floor. High sensitive troponin was negative. Given no other cardiac symptoms, would consider further workup as an outpatient.  1. No further cardiac diagnostics or interventions indicated at this time.  2. Outpatient follow up with Dr. Clayborn Bigness next week for further outpatient work up   The patient's history and exam findings were discussed with Dr. Nehemiah Massed. The plan was made in conjunction with Dr. Nehemiah Massed.  Signed: Hilbert Odor PA-C 07/11/2018, 8:47 AM  The patient has been interviewed and examined. I agree with assessment and plan above. Serafina Royals MD [facility]

## 2018-07-11 NOTE — Care Management Obs Status (Signed)
[facility] NOTIFICATION   Patient Details  Name: [name] MRN: [id number] Date of Birth: 1935/07/19   Medicare Observation Status Notification Given:       Beverly Sessions, RN 07/11/2018, 4:31 PM

## 2018-07-11 NOTE — Evaluation (Signed)
Physical Therapy Evaluation Patient Details Name: [name] MRN: [id number] DOB: December 07, 1935 Today's Date: 07/11/2018   History of Present Illness  83 y.o. with a past medical history of coronary artery disease as seen by cath from 2017, however, he was not a candidate for PCI or bypass, now on medical management, HTN, HLD, CKD, stroke,aortic stenosis and COPD, who presented to the ED with abdominal pain and constipation. A fecal disimpaction procedure was attempted, although his heart rate increased to nearly ~150 BPM and the procedure was aborted. Cardiology assessed pt and suggested outpatient follow up.    Clinical Impression  Patient awake, alert, amendable to wear mask in room with encouragement. Patient was able to provide PLOF; stated he lives with his wife and daughter, and does not need help with ADLs, ambulates with 2 wheeled walker. Does endorse that lately he has been feeling very weak.   Patient stated that he felt very weak but willing to attempt mobility. Supine to sit with HOB elevated and CGA to maintain safety. Pt groans with effort and stated feeling weak, vitals assessed all WFLs. LE strength assessed, pt unable to resist PT during MMT. Sit <> stand with RW and minA, pt relies heavily on bracing LE against bed to stabilize.  Pt cautious, complaining of fatigue throughout mobility. minA to maintain safety pt with posterior lean during small shuffled steps towards chair. Pt denied any further ambulation due to fatigue as well.  Overall the patient demonstrated deficits (see "PT Problem List") that impede the patient's functional abilities, safety, and mobility and would benefit from skilled PT intervention. Recommendation is STR due to safety concerns, current level of assistance needed and to decrease risk of falls.      Follow Up Recommendations SNF    Equipment Recommendations  Other (comment)(PT has RW, cane and WC at home)    Recommendations for Other Services        Precautions / Restrictions Precautions Precautions: Fall Restrictions Weight Bearing Restrictions: No      Mobility  Bed Mobility Overal bed mobility: Needs Assistance Bed Mobility: Supine to Sit     Supine to sit: Min guard;HOB elevated        Transfers Overall transfer level: Needs assistance Equipment used: Rolling walker (2 wheeled) Transfers: Sit to/from Stand Sit to Stand: Min assist            Ambulation/Gait   Gait Distance (Feet): 2 Feet Assistive device: Rolling walker (2 wheeled)   Gait velocity: decreased   General Gait Details: Pt cautious, complaining of fatigue throughout mobility. minA to maintain safety pt with posterior lean during small shuffled steps towards chair. Pt denied any further ambulation due to fatigue  Stairs            Wheelchair Mobility    Modified Rankin (Stroke Patients Only)       Balance Overall balance assessment: Needs assistance   Sitting balance-Leahy Scale: Fair       Standing balance-Leahy Scale: Poor Standing balance comment: Pt reliant of RW and legs braced on bed                             Pertinent Vitals/Pain Pain Assessment: No/denies pain    Home Living Family/patient expects to be discharged to:: Private residence Living Arrangements: Spouse/significant other;Other (Comment)(granddaughter) Available Help at Discharge: Family Type of Home: House Home Access: Stairs to enter Entrance Stairs-Rails: Left Entrance Stairs-Number of Steps: 1 Home  Layout: One level Home Equipment: Walker - 2 wheels;Tub bench;Cane - quad      Prior Function Level of Independence: Needs assistance   Gait / Transfers Assistance Needed: Pt reported using two wheeled walker for household ambulation  ADL's / Homemaking Assistance Needed: Pt reported he does not need asssistance with dressing/bathing, family does cooking/cleaning        Hand Dominance   Dominant Hand: Right     Extremity/Trunk Assessment   Upper Extremity Assessment Upper Extremity Assessment: Generalized weakness    Lower Extremity Assessment Lower Extremity Assessment: Generalized weakness(unable to maintain strength against any resistance today, 3/5 for all movements)    Cervical / Trunk Assessment Cervical / Trunk Assessment: Normal  Communication   Communication: Expressive difficulties;Other (comment)(slow, broken speech but able to communicate PLOF/feelings)  Cognition Arousal/Alertness: Awake/alert Behavior During Therapy: WFL for tasks assessed/performed Overall Cognitive Status: Within Functional Limits for tasks assessed                                        General Comments      Exercises     Assessment/Plan    PT Assessment Patient needs continued PT services  PT Problem List Decreased strength;Decreased mobility;Decreased safety awareness;Decreased activity tolerance;Decreased balance       PT Treatment Interventions DME instruction;Therapeutic exercise;Gait training;Balance training;Stair training;Neuromuscular re-education;Functional mobility training;Therapeutic activities;Patient/family education    PT Goals (Current goals can be found in the Care Plan section)  Acute Rehab PT Goals Patient Stated Goal: to go home PT Goal Formulation: With patient Time For Goal Achievement: 07/25/18 Potential to Achieve Goals: Fair    Frequency Min 2X/week   Barriers to discharge Decreased caregiver support      Co-evaluation               AM-PAC PT "6 Clicks" Mobility  Outcome Measure Help needed turning from your back to your side while in a flat bed without using bedrails?: A Lot Help needed moving from lying on your back to sitting on the side of a flat bed without using bedrails?: A Lot Help needed moving to and from a bed to a chair (including a wheelchair)?: A Lot Help needed standing up from a chair using your arms (e.g., wheelchair or  bedside chair)?: A Lot Help needed to walk in hospital room?: A Lot Help needed climbing 3-5 steps with a railing? : Total 6 Click Score: 11    End of Session Equipment Utilized During Treatment: Gait belt Activity Tolerance: Patient limited by fatigue Patient left: in chair;with chair alarm set;with call bell/phone within reach Nurse Communication: Mobility status PT Visit Diagnosis: Other abnormalities of gait and mobility (R26.89);Muscle weakness (generalized) (M62.81);Difficulty in walking, not elsewhere classified (R26.2);History of falling (Z91.81)    Time: 1433-1500 PT Time Calculation (min) (ACUTE ONLY): 27 min   Charges:   PT Evaluation $PT Eval Moderate Complexity: 1 Mod PT Treatments $Therapeutic Activity: 8-22 mins        Lieutenant Diego PT, DPT 3:25 PM,07/11/18 [phone]

## 2018-07-11 NOTE — Progress Notes (Signed)
[name]: [name]    MR#:  [id number]  DATE OF BIRTH:  November 22, 1935  SUBJECTIVE:   Patient sitting at the side of the bed and eating lunch. He is not had a bowel movement yet but plans are to get him a fleet enema today.  No arrhythmia reported. REVIEW OF SYSTEMS:   Review of Systems  Constitutional: Negative for chills, fever and weight loss.  HENT: Negative for ear discharge, ear pain and nosebleeds.   Eyes: Negative for blurred vision, pain and discharge.  Respiratory: Negative for sputum production, shortness of breath, wheezing and stridor.   Cardiovascular: Negative for chest pain, palpitations, orthopnea and PND.  Gastrointestinal: Positive for constipation. Negative for abdominal pain, diarrhea, nausea and vomiting.  Genitourinary: Negative for frequency and urgency.  Musculoskeletal: Negative for back pain and joint pain.  Neurological: Negative for sensory change, speech change, focal weakness and weakness.  Psychiatric/Behavioral: Negative for depression and hallucinations. The patient is not nervous/anxious.    Tolerating Diet: yes tolerating PT:   DRUG ALLERGIES:   Allergies  Allergen Reactions  . Ace Inhibitors Other (See Comments)    Renal Failure   . Nsaids Other (See Comments)    GI Bleed  . Isosorbide Nitrate Nausea Only    Headache    VITALS:  Blood pressure 102/74, pulse (!) 109, temperature 97.8 F (36.6 C), temperature source Oral, resp. rate 18, height 5\' 10"  (1.778 m), weight 87.5 kg, SpO2 94 %.  PHYSICAL EXAMINATION:   Physical Exam  GENERAL:  83 y.o.-year-old patient lying in the bed with no acute distress. Chronically ill and weak EYES: Pupils equal, round, reactive to light and accommodation. No scleral icterus. Extraocular muscles intact.  HEENT: Head atraumatic, normocephalic. Oropharynx and nasopharynx clear.  NECK:  Supple, no jugular venous distention. No thyroid  enlargement, no tenderness.  LUNGS: Normal breath sounds bilaterally, no wheezing, rales, rhonchi. No use of accessory muscles of respiration.  CARDIOVASCULAR: S1, S2 normal. No murmurs, rubs, or gallops.  ABDOMEN: Soft, nontender, nondistended. Bowel sounds present. No organomegaly or mass.  EXTREMITIES: No cyanosis, clubbing or edema b/l.    NEUROLOGIC: Cranial nerves II through XII are intact. No focal Motor or sensory deficits b/l.   PSYCHIATRIC:  patient is alert and oriented x 3.  SKIN: No obvious rash, lesion, or ulcer.   LABORATORY PANEL:  CBC Recent Labs  Lab 07/11/18 0442  WBC 9.6  HGB 11.5*  HCT 36.0*  PLT 200    Chemistries  Recent Labs  Lab 07/10/18 1959 07/10/18 2217 07/11/18 0442  NA 139  --  138  K 4.9  --  4.8  CL 109  --  110  CO2 20*  --  19*  GLUCOSE 136*  --  108*  BUN 35*  --  37*  CREATININE 3.52*  --  3.92*  CALCIUM 9.0  --  8.7*  MG  --  1.7  --   AST 13*  --   --   ALT 12  --   --   ALKPHOS 100  --   --   BILITOT 0.6  --   --    Cardiac Enzymes No results for input(s): TROPONINI in the last 168 hours. RADIOLOGY:  Ct Abdomen Pelvis Wo Contrast  Addendum Date: 07/10/2018   ADDENDUM REPORT: 07/10/2018 21:29 ADDENDUM: There is an infrarenal abdominal aortic aneurysm measuring approximately 3 cm. Recommend followup by ultrasound in 3 years. This  recommendation follows ACR consensus guidelines: White Paper of the ACR Incidental Findings Committee II on Vascular Findings. J Am Coll Radiol 2013; 10:789-794. Aortic aneurysm NOS (ICD10-I71.9) Electronically Signed   By: Constance Holster M.D.   On: 07/10/2018 21:29   Result Date: 07/10/2018 CLINICAL DATA:  Right lower quadrant pain. EXAM: CT ABDOMEN AND PELVIS WITHOUT CONTRAST TECHNIQUE: Multidetector CT imaging of the abdomen and pelvis was performed following the standard protocol without IV contrast. COMPARISON:  CT dated September 06, 2017 FINDINGS: Lower chest: No acute abnormality. Hepatobiliary: The  liver surface is somewhat nodular raising concern for underlying cirrhosis. There is no discrete hepatic mass. The gallbladder is unremarkable. Pancreas: Unremarkable. No pancreatic ductal dilatation or surrounding inflammatory changes. Spleen: The spleen is enlarged measures approximately 12.6 cm craniocaudad. Adrenals/Urinary Tract: There are innumerable cortical cysts involving both kidneys. There is new right-sided hydronephrosis without evidence of a radiopaque obstructing stone. There is fat stranding about the right kidney. There are few punctate nonobstructing stones involving both kidneys. Stomach/Bowel: There is sigmoid diverticulosis without definite CT evidence of diverticulitis. Appendix is normal. The stomach is unremarkable. There is no evidence of a small-bowel obstruction. Vascular/Lymphatic: There are atherosclerotic changes throughout the abdominal aorta. The infrarenal abdominal aorta measures up to approximately 3 cm in diameter. There are no pathologically enlarged retroperitoneal lymph nodes. Reproductive: Prostate gland is significantly enlarged. There is likely a left-sided varicocele. Other: There are bilateral fat containing inguinal hernias. Musculoskeletal: There are degenerative changes through the out the lumbar spine chronic greatest at the L4-L5 and L5-S1 levels. Again noted is grade 1 anterolisthesis of L4 on L5. IMPRESSION: 1. New mild right-sided hydroureteronephrosis without evidence of a radiopaque obstructing stone. Findings could be secondary to a recently passed stone versus an ascending urinary tract infection. Correlation with urinalysis is recommended. No radiopaque stones identified within the urinary bladder. 2. There are multiple punctate nonobstructing stones involving both kidneys. Again noted is polycystic kidney disease bilaterally. 3. Cirrhosis with stigmata of portal hypertension including splenomegaly. 4. Sigmoid diverticulosis without CT evidence of  diverticulitis. 5.  Aortic Atherosclerosis (ICD10-I70.0). Electronically Signed: By: Constance Holster M.D. On: 07/10/2018 21:15   ASSESSMENT AND PLAN:  [name] is a 83 y.o. with a past medical history of coronary artery disease as seen by cath from 2017, however, he was not a candidate for PCI or bypass, now on medical management, HTN, HLD, and COPD, who presented to the ED with abdominal pain and constipation  *  Arrhythmia -unclear etiology.   have been SVT  -no further arrhythmias reported. Continue to monitor. Continue cardiac meds.  -seen by Dr. Nehemiah Massed no further recommendations at this time  -pt will f/u dr Clayborn Bigness as out pt  * Fecal impaction (HCC) -p.o. lactulose, glycerin suppository--finally had a BM after enema   *Essential hypertension -continue home meds  *  Chronic obstructive pulmonary disease ([place]) -continue home inhalers Stable  *  CAD (coronary artery disease) -continue statins and prn nitro   *CKD (chronic kidney disease), stage IV (HCC) -baseline, avoid nephrotoxins and monitor   baseline creat 3.5  *Cirrhosis ([place]) -avoid hepatotoxins  CODE STATUS: DNR  DVT Prophylaxis: heparin  Patient can discharge tomorrow if he continues to remain stable on tele monitor.  TOTAL TIME TAKING CARE OF THIS PATIENT: *30* minutes.  >50% time spent on counselling and coordination of care  POSSIBLE D/C IN *1* DAYS, DEPENDING ON CLINICAL CONDITION.  Note: This dictation was prepared with Dragon dictation along with smaller phrase  technology. Any transcriptional errors that result from this process are unintentional.  Fritzi Mandes M.D on 07/11/2018 at 2:47 PM  Between 7am to 6pm - Pager - [phone]  After 6pm go to [email or web address] - password [email or web address] [facility]  Office  [phone]  CC: Primary care physician; Rusty Aus, MDPatient ID: Monica Martinez, male   DOB: February 18, 1935, 83 y.o.   MRN: [id number]

## 2018-07-12 LAB — NOVEL CORONAVIRUS, NAA (HOSP ORDER, SEND-OUT TO REF LAB; TAT 18-24 HRS): SARS-CoV-2, NAA: NOT DETECTED

## 2018-07-12 MED ORDER — POLYETHYLENE GLYCOL 3350 17 G PO PACK: 17.0000 g | PACK | Freq: Every day | ORAL | 0 refills | Status: AC

## 2018-07-12 MED ORDER — METOPROLOL TARTRATE 25 MG PO TABS: 12.5000 mg | ORAL_TABLET | Freq: Two times a day (BID) | ORAL | Status: DC

## 2018-07-12 MED ORDER — POLYETHYLENE GLYCOL 3350 17 G PO PACK: 17.0000 g | PACK | Freq: Every day | ORAL | Status: DC

## 2018-07-12 MED ORDER — METOPROLOL TARTRATE 25 MG PO TABS: 12.5000 mg | ORAL_TABLET | Freq: Two times a day (BID) | ORAL | 0 refills | Status: AC

## 2018-07-12 NOTE — Care Management Obs Status (Signed)
[facility] NOTIFICATION   Patient Details  Name: REO PORTELA MRN: [id number] Date of Birth: 02/12/1935   Medicare Observation Status Notification Given:  Yes    Yamin Swingler A Olivine Hiers, RN 07/12/2018, 10:08 AM

## 2018-07-12 NOTE — TOC Transition Note (Signed)
Transition of Care [facility]) - CM/SW Discharge Note   Patient Details  Name: [name] MRN: [id number] Date of Birth: [birth year]  [facility] CM/SW Contact:  Latanya Maudlin, RN Phone Number: 07/12/2018, 3:09 PM   Clinical Narrative:  Patient to be discharged per MD order. Orders in place for home health services. CMS Medicare.gov Compare Post Acute Care list reviewed with patient and he has no preference of agency. Referral given to [place] at [facility]. Spouse to transport. Patient has wheelchair,rolling walker,can and shower chair.     Final next level of care: Home w Home Health Services Barriers to Discharge: No Barriers Identified   Patient Goals and CMS Choice   CMS Medicare.gov Compare Post Acute Care list provided to:: Patient Choice offered to / list presented to : Patient  Discharge Placement                       Discharge Plan and Services                          HH Arranged: RN, PT, Nurse's Aide [facility] Agency: [facility] [place] Date [facility] Agency Contacted: 07/12/18 Time [place]: 1509 Representative spoke with at [place]: [place] ([place]) Interventions     Readmission Risk Interventions No flowsheet data found.

## 2018-07-12 NOTE — Progress Notes (Signed)
[facility] Cardiology [facility] Encounter Note  Patient: [name] / Admit Date: 07/10/2018 / Date of Encounter: 07/12/2018, 6:38 AM   Subjective: Patient overall slightly improved since admission.  No evidence of significant abdominal distress at this time.  No evidence of myocardial infarction or congestive heart failure type symptoms.  No angina or shortness of breath of any significant degree.  Patient's telemetry has no evidence of reports of further supraventricular tachycardia which appeared to have been most consistent with current illness rather than a primary cardiac event  Review of Systems: Positive for: None Negative for: Vision change, hearing change, syncope, dizziness, nausea, vomiting,diarrhea, bloody stool, stomach pain, cough, congestion, diaphoresis, urinary frequency, urinary pain,skin lesions, skin rashes Others previously listed  Objective: Telemetry: Normal sinus rhythm Physical Exam: Blood pressure (!) 126/96, pulse (!) 114, temperature 100 F (37.8 C), temperature source Oral, resp. rate 20, height 5\' 10"  (1.778 m), weight 82.7 kg, SpO2 94 %. Body mass index is 26.16 kg/m. General: Well developed, well nourished, in no acute distress. Head: Normocephalic, atraumatic, sclera non-icteric, no xanthomas, nares are without discharge. Neck: No apparent masses Lungs: Normal respirations with no wheezes, no rhonchi, no rales , no crackles   Heart: Regular rate and rhythm, normal S1 S2, no murmur, no rub, no gallop, PMI is normal size and placement, carotid upstroke normal without bruit, jugular venous pressure normal Abdomen: Soft, non-tender, non-distended with normoactive bowel sounds. No hepatosplenomegaly. Abdominal aorta is normal size without bruit Extremities: No edema, no clubbing, no cyanosis, no ulcers,  Peripheral: 2+ radial, 2+ femoral, 2+ dorsal pedal pulses Neuro: Alert and oriented. Moves all extremities spontaneously. Psych:  Responds to questions  appropriately with a normal affect.   Intake/Output Summary (Last 24 hours) at 07/12/2018 9562 Last data filed at 07/12/2018 0340 Gross per 24 hour  Intake -  Output 450 ml  Net -450 ml    Inpatient Medications:  . amLODipine  5 mg Oral Daily  . atorvastatin  40 mg Oral QHS  . citalopram  20 mg Oral Daily  . ferrous gluconate  324 mg Oral Q breakfast  . heparin  5,000 Units Subcutaneous Q8H  . lactulose  20 g Oral TID  . pantoprazole  40 mg Oral BID  . tamsulosin  0.4 mg Oral QHS  . venlafaxine XR  37.5 mg Oral Daily   Infusions:   Labs: Recent Labs    07/10/18 1959 07/10/18 2217 07/11/18 0442  NA 139  --  138  K 4.9  --  4.8  CL 109  --  110  CO2 20*  --  19*  GLUCOSE 136*  --  108*  BUN 35*  --  37*  CREATININE 3.52*  --  3.92*  CALCIUM 9.0  --  8.7*  MG  --  1.7  --    Recent Labs    07/10/18 1959  AST 13*  ALT 12  ALKPHOS 100  BILITOT 0.6  PROT 7.6  ALBUMIN 4.0   Recent Labs    07/10/18 1959 07/11/18 0442  WBC 9.4 9.6  NEUTROABS 8.4*  --   HGB 11.7* 11.5*  HCT 36.9* 36.0*  MCV 88.7 86.7  PLT 191 200   No results for input(s): CKTOTAL, CKMB, TROPONINI in the last 72 hours. Invalid input(s): POCBNP No results for input(s): HGBA1C in the last 72 hours.   Weights: Filed Weights   07/10/18 1859 07/12/18 0445  Weight: 87.5 kg 82.7 kg     Radiology/Studies:  Ct  Abdomen Pelvis Wo Contrast  Addendum Date: 07/10/2018   ADDENDUM REPORT: 07/10/2018 21:29 ADDENDUM: There is an infrarenal abdominal aortic aneurysm measuring approximately 3 cm. Recommend followup by ultrasound in 3 years. This recommendation follows ACR consensus guidelines: White Paper of the ACR Incidental Findings Committee II on Vascular Findings. J Am Coll Radiol 2013; 10:789-794. Aortic aneurysm NOS (ICD10-I71.9) Electronically Signed   By: Constance Holster M.D.   On: 07/10/2018 21:29   Result Date: 07/10/2018 CLINICAL DATA:  Right lower quadrant pain. EXAM: CT ABDOMEN AND  PELVIS WITHOUT CONTRAST TECHNIQUE: Multidetector CT imaging of the abdomen and pelvis was performed following the standard protocol without IV contrast. COMPARISON:  CT dated September 06, 2017 FINDINGS: Lower chest: No acute abnormality. Hepatobiliary: The liver surface is somewhat nodular raising concern for underlying cirrhosis. There is no discrete hepatic mass. The gallbladder is unremarkable. Pancreas: Unremarkable. No pancreatic ductal dilatation or surrounding inflammatory changes. Spleen: The spleen is enlarged measures approximately 12.6 cm craniocaudad. Adrenals/Urinary Tract: There are innumerable cortical cysts involving both kidneys. There is new right-sided hydronephrosis without evidence of a radiopaque obstructing stone. There is fat stranding about the right kidney. There are few punctate nonobstructing stones involving both kidneys. Stomach/Bowel: There is sigmoid diverticulosis without definite CT evidence of diverticulitis. Appendix is normal. The stomach is unremarkable. There is no evidence of a small-bowel obstruction. Vascular/Lymphatic: There are atherosclerotic changes throughout the abdominal aorta. The infrarenal abdominal aorta measures up to approximately 3 cm in diameter. There are no pathologically enlarged retroperitoneal lymph nodes. Reproductive: Prostate gland is significantly enlarged. There is likely a left-sided varicocele. Other: There are bilateral fat containing inguinal hernias. Musculoskeletal: There are degenerative changes through the out the lumbar spine chronic greatest at the L4-L5 and L5-S1 levels. Again noted is grade 1 anterolisthesis of L4 on L5. IMPRESSION: 1. New mild right-sided hydroureteronephrosis without evidence of a radiopaque obstructing stone. Findings could be secondary to a recently passed stone versus an ascending urinary tract infection. Correlation with urinalysis is recommended. No radiopaque stones identified within the urinary bladder. 2. There  are multiple punctate nonobstructing stones involving both kidneys. Again noted is polycystic kidney disease bilaterally. 3. Cirrhosis with stigmata of portal hypertension including splenomegaly. 4. Sigmoid diverticulosis without CT evidence of diverticulitis. 5.  Aortic Atherosclerosis (ICD10-I70.0). Electronically Signed: By: Constance Holster M.D. On: 07/10/2018 21:15     Assessment and Recommendation  83 y.o. male with known coronary artery disease chronic kidney disease stage IV essential hypertension mixed hyperlipidemia previous peripheral vascular disease having significant abdominal discomfort with short run of asymptomatic supraventricular tachycardia likely secondary to current illness with no further event thereafter 1.  Continue hypertension control with current medical regimen including amlodipine from before 2.  High intensity cholesterol therapy for peripheral vascular disease coronary artery disease 3.  No further cardiac intervention or diagnostics necessary at this time 4.  Begin ambulation and follow-up for improvements of symptoms and okay for discharge home from cardiac standpoint with follow-up next week for further adjustments as necessary  Signed, Serafina Royals M.D. FACC

## 2018-07-12 NOTE — Progress Notes (Signed)
Pt to be discharged today. Iv and tele removed. disch instructions given to pt and daughter. disch via w.c. daughter to transport

## 2018-07-12 NOTE — Discharge Summary (Signed)
[employer] [facility] [place] at [facility], 83 y.o., DOB Oct 02, 1935, MRN [id number]. Admission date: 07/10/2018 Discharge Date 07/12/2018 Primary MD Rusty Aus, MD Admitting Physician Lance Coon, MD  Admission Diagnosis  Tachy-brady syndrome [facility]) [I49.5] Abdominal pain, unspecified abdominal location [R10.9] Hydronephrosis, unspecified hydronephrosis type [N13.30]  Discharge Diagnosis   Principal Problem: SVT   Essential hypertension   Chronic obstructive pulmonary disease ([place])   CKD (chronic kidney disease), stage IV ([place])   Cirrhosis ([place])   CAD (coronary artery disease)   Fecal impaction [facility])           Hospital Course Keldon Lassen  is a 83 y.o. male who presents with chief complaint as above.  Patient presents the ED with a complaint of abdominal pain.  He is found on imaging to have large stool burden with fecal impaction.  ED physician attempted disimpaction.  When he did so the patient's heart rate rose to close to 200 with an abnormal rhythm on the monitor.  Disimpaction attempt was aborted and hospitalist were called for admission.  Patient was noted to have SVT he was admitted to the hospital and was seen by cardiology.  Patient subsequently had good bowel movement.  His heart rate is slightly elevated he does have history of atrial arrhythmias.  Patient was treated with metoprolol heart rate is now stable.  He is very anxious to go home today.  PT recommended skilled nursing facility however patient stated that he does not want to go to a skilled nursing facility home health will be arranged.           Consults  cardiology  Significant Tests:  See full reports for all details     Ct Abdomen Pelvis Wo Contrast  Addendum Date: 07/10/2018   ADDENDUM REPORT: 07/10/2018 21:29 ADDENDUM: There is an infrarenal abdominal aortic aneurysm measuring approximately 3 cm. Recommend followup by ultrasound in 3 years. This recommendation follows ACR  consensus guidelines: White Paper of the ACR Incidental Findings Committee II on Vascular Findings. J Am Coll Radiol 2013; 10:789-794. Aortic aneurysm NOS (ICD10-I71.9) Electronically Signed   By: Constance Holster M.D.   On: 07/10/2018 21:29   Result Date: 07/10/2018 CLINICAL DATA:  Right lower quadrant pain. EXAM: CT ABDOMEN AND PELVIS WITHOUT CONTRAST TECHNIQUE: Multidetector CT imaging of the abdomen and pelvis was performed following the standard protocol without IV contrast. COMPARISON:  CT dated September 06, 2017 FINDINGS: Lower chest: No acute abnormality. Hepatobiliary: The liver surface is somewhat nodular raising concern for underlying cirrhosis. There is no discrete hepatic mass. The gallbladder is unremarkable. Pancreas: Unremarkable. No pancreatic ductal dilatation or surrounding inflammatory changes. Spleen: The spleen is enlarged measures approximately 12.6 cm craniocaudad. Adrenals/Urinary Tract: There are innumerable cortical cysts involving both kidneys. There is new right-sided hydronephrosis without evidence of a radiopaque obstructing stone. There is fat stranding about the right kidney. There are few punctate nonobstructing stones involving both kidneys. Stomach/Bowel: There is sigmoid diverticulosis without definite CT evidence of diverticulitis. Appendix is normal. The stomach is unremarkable. There is no evidence of a small-bowel obstruction. Vascular/Lymphatic: There are atherosclerotic changes throughout the abdominal aorta. The infrarenal abdominal aorta measures up to approximately 3 cm in diameter. There are no pathologically enlarged retroperitoneal lymph nodes. Reproductive: Prostate gland is significantly enlarged. There is likely a left-sided varicocele. Other: There are bilateral fat containing inguinal hernias. Musculoskeletal: There are degenerative changes through the out the lumbar spine chronic greatest at the L4-L5 and L5-S1 levels.  Again noted is grade 1 anterolisthesis of  L4 on L5. IMPRESSION: 1. New mild right-sided hydroureteronephrosis without evidence of a radiopaque obstructing stone. Findings could be secondary to a recently passed stone versus an ascending urinary tract infection. Correlation with urinalysis is recommended. No radiopaque stones identified within the urinary bladder. 2. There are multiple punctate nonobstructing stones involving both kidneys. Again noted is polycystic kidney disease bilaterally. 3. Cirrhosis with stigmata of portal hypertension including splenomegaly. 4. Sigmoid diverticulosis without CT evidence of diverticulitis. 5.  Aortic Atherosclerosis (ICD10-I70.0). Electronically Signed: By: Constance Holster M.D. On: 07/10/2018 21:15       Today   Subjective:   Pam Drown patient doing well denies any complaints  Objective:   Blood pressure 136/79, pulse (!) 105, temperature 98.3 F (36.8 C), temperature source Oral, resp. rate 20, height 5\' 10"  (1.778 m), weight 82.7 kg, SpO2 94 %.  .  Intake/Output Summary (Last 24 hours) at 07/12/2018 1650 Last data filed at 07/12/2018 1400 Gross per 24 hour  Intake -  Output 675 ml  Net -675 ml    Exam VITAL SIGNS: Blood pressure 136/79, pulse (!) 105, temperature 98.3 F (36.8 C), temperature source Oral, resp. rate 20, height 5\' 10"  (1.778 m), weight 82.7 kg, SpO2 94 %.  GENERAL:  83 y.o.-year-old patient lying in the bed with no acute distress.  EYES: Pupils equal, round, reactive to light and accommodation. No scleral icterus. Extraocular muscles intact.  HEENT: Head atraumatic, normocephalic. Oropharynx and nasopharynx clear.  NECK:  Supple, no jugular venous distention. No thyroid enlargement, no tenderness.  LUNGS: Normal breath sounds bilaterally, no wheezing, rales,rhonchi or crepitation. No use of accessory muscles of respiration.  CARDIOVASCULAR: S1, S2 normal. No murmurs, rubs, or gallops.  ABDOMEN: Soft, nontender, nondistended. Bowel sounds present. No organomegaly or  mass.  EXTREMITIES: No pedal edema, cyanosis, or clubbing.  NEUROLOGIC: Cranial nerves II through XII are intact. Muscle strength 5/5 in all extremities. Sensation intact. Gait not checked.  PSYCHIATRIC: The patient is alert and oriented x 3.  SKIN: No obvious rash, lesion, or ulcer.   Data Review     CBC w Diff:  Lab Results  Component Value Date   WBC 9.6 07/11/2018   HGB 11.5 (L) 07/11/2018   HGB 12.2 (L) 09/30/2013   HCT 36.0 (L) 07/11/2018   HCT 38.7 (L) 09/30/2013   PLT 200 07/11/2018   PLT 215 09/30/2013   LYMPHOPCT 5 07/10/2018   LYMPHOPCT 16.6 09/30/2013   MONOPCT 4 07/10/2018   MONOPCT 8.1 09/30/2013   EOSPCT 0 07/10/2018   EOSPCT 2.7 09/30/2013   BASOPCT 0 07/10/2018   BASOPCT 0.6 09/30/2013   CMP:  Lab Results  Component Value Date   NA 138 07/11/2018   NA 140 09/30/2013   K 4.8 07/11/2018   K 4.8 09/30/2013   CL 110 07/11/2018   CL 116 (H) 09/30/2013   CO2 19 (L) 07/11/2018   CO2 20 (L) 09/30/2013   BUN 37 (H) 07/11/2018   BUN 45 (H) 09/30/2013   CREATININE 3.92 (H) 07/11/2018   CREATININE 2.91 (H) 09/30/2013   PROT 7.6 07/10/2018   PROT 6.9 09/30/2013   ALBUMIN 4.0 07/10/2018   ALBUMIN 3.1 (L) 09/30/2013   BILITOT 0.6 07/10/2018   BILITOT 0.4 09/30/2013   ALKPHOS 100 07/10/2018   ALKPHOS 94 09/30/2013   AST 13 (L) 07/10/2018   AST 13 (L) 09/30/2013   ALT 12 07/10/2018   ALT 18 09/30/2013  .  Micro Results  Recent Results (from the past 240 hour(s))  Novel Coronavirus,NAA,(SEND-OUT TO REF LAB - TAT 24-48 hrs); Hosp Order     Status: None   Collection Time: 07/10/18 11:22 PM   Specimen: Nasopharyngeal Swab; Respiratory  Result Value Ref Range Status   SARS-CoV-2, NAA NOT DETECTED NOT DETECTED Final    Comment: (NOTE) This test was developed and its performance characteristics determined by [employer]. This test has not been FDA cleared or approved. This test has been authorized by FDA under an Emergency Use Authorization (EUA).  This test is only authorized for the duration of time the declaration that circumstances exist justifying the authorization of the emergency use of in vitro diagnostic tests for detection of SARS-CoV-2 virus and/or diagnosis of COVID-19 infection under section 564(b)(1) of the Act, 21 U.S.C. 494WHQ-7(R)(9), unless the authorization is terminated or revoked sooner. When diagnostic testing is negative, the possibility of a false negative result should be considered in the context of a patient's recent exposures and the presence of clinical signs and symptoms consistent with COVID-19. An individual without symptoms of COVID-19 and who is not shedding SARS-CoV-2 virus would expect to have a negative (not detected) result in this assay. Performed  At: [facility] [address] [place], [place] [id number] Rush Farmer MD DJ:[id number]    [place]  Final    Comment: Performed at [facility], [address] [place], [place] [address]        Code Status Orders  (From admission, onward)         Start     Ordered   07/11/18 0045  Do not attempt resuscitation (DNR)  Continuous    Question Answer Comment  In the event of cardiac or respiratory ARREST Do not call a "code blue"   In the event of cardiac or respiratory ARREST Do not perform Intubation, CPR, defibrillation or ACLS   In the event of cardiac or respiratory ARREST Use medication by any route, position, wound care, and other measures to relive pain and suffering. May use oxygen, suction and manual treatment of airway obstruction as needed for comfort.      07/11/18 0044        Code Status History    Date Active Date Inactive Code Status Order ID Comments User Context   12/04/2017 2201 12/06/2017 1643 DNR [id number]  Vaughan Basta, MD Inpatient   08/19/2017 1524 09/07/2017 1447 Full Code [id number]  Flora Lipps Inpatient   08/15/2017 0358 08/19/2017 1458 Full Code  [id number]  Arta Silence, MD Inpatient   08/13/2017 1704 08/15/2017 0358 Full Code [id number]  Demetrios Loll, MD Inpatient   08/04/2015 1541 08/04/2015 2020 Full Code [id number]  Yolonda Kida, MD Inpatient   Advance Care Planning Activity          Follow-up Information    Rusty Aus, MD Follow up in 6 day(s).   Specialty: Internal Medicine Contact information: [place]  [address] [phone]           Discharge Medications   Allergies as of 07/12/2018      Reactions   Ace Inhibitors Other (See Comments)   Renal Failure   Nsaids Other (See Comments)   GI Bleed   Isosorbide Nitrate Nausea Only   Headache      Medication List    TAKE these medications   acetaminophen 325 MG tablet Commonly known as: TYLENOL Take 1-2 tablets (325-650 mg total) by  mouth every 4 (four) hours as needed for mild pain.   ALPRAZolam 0.5 MG tablet Commonly known as: XANAX Take 0.5 mg by mouth 2 (two) times daily as needed for anxiety.   amLODipine 5 MG tablet Commonly known as: NORVASC Take 5 mg by mouth daily.   atorvastatin 40 MG tablet Commonly known as: LIPITOR Take 1 tablet (40 mg total) by mouth daily at 6 PM. What changed: when to take this   calcitRIOL 0.25 MCG capsule Commonly known as: ROCALTROL Take 1 capsule (0.25 mcg total) by mouth every Monday, Wednesday, and Friday. Notes to patient: Resume regular schedule   citalopram 20 MG tablet Commonly known as: CELEXA Take 1.5 tablets (30 mg total) by mouth daily.   ferrous gluconate 324 MG tablet Commonly known as: FERGON Take 324 mg by mouth daily with breakfast.   metoprolol tartrate 25 MG tablet Commonly known as: LOPRESSOR Take 0.5 tablets (12.5 mg total) by mouth 2 (two) times daily.   pantoprazole 40 MG tablet Commonly known as: Protonix Take 1 tablet (40 mg total) by mouth daily. What changed: when to take this   polyethylene glycol 17 g  packet Commonly known as: MIRALAX / GLYCOLAX Take 17 g by mouth daily.   polyvinyl alcohol 1.4 % ophthalmic solution Commonly known as: LIQUIFILM TEARS Place 1 drop into both eyes as needed for dry eyes (Q 6 HRS). What changed:   when to take this  reasons to take this   sodium chloride 0.65 % Soln nasal spray Commonly known as: OCEAN Place 1 spray into both nostrils as needed for congestion.   tamsulosin 0.4 MG Caps capsule Commonly known as: FLOMAX Take 1 capsule (0.4 mg total) by mouth daily after breakfast. What changed: when to take this   traMADol 50 MG tablet Commonly known as: ULTRAM Take 1 tablet (50 mg total) by mouth every 6 (six) hours as needed for moderate pain.   umeclidinium-vilanterol 62.5-25 MCG/INH Aepb Commonly known as: ANORO ELLIPTA Inhale 1 puff into the lungs daily.   venlafaxine XR 37.5 MG 24 hr capsule Commonly known as: EFFEXOR-XR Take 37.5 mg by mouth daily.          Total Time in preparing paper work, data evaluation and todays exam - 34 minutes  Dustin Flock M.D on 07/12/2018 at 4:50 PM [place]  [phone]

## 2018-07-12 NOTE — Plan of Care (Signed)
  Problem: Elimination: Goal: Will not experience complications related to urinary retention Outcome: Progressing   Problem: Pain Managment: Goal: General experience of comfort will improve Outcome: Progressing Note: Complaints of abdominal pain once, treated with tylenol, which gave relief   Problem: Safety: Goal: Ability to remain free from injury will improve Outcome: Progressing   Problem: Skin Integrity: Goal: Risk for impaired skin integrity will decrease Outcome: Progressing

## 2018-08-18 ENCOUNTER — Other Ambulatory Visit: Payer: Self-pay | Admitting: Nephrology

## 2018-08-18 DIAGNOSIS — N184 Chronic kidney disease, stage 4 (severe): Secondary | ICD-10-CM

## 2018-08-27 ENCOUNTER — Ambulatory Visit: Payer: Medicare Other

## 2018-09-02 ENCOUNTER — Ambulatory Visit: Payer: Medicare Other | Admitting: Urology

## 2018-09-26 ENCOUNTER — Ambulatory Visit: Payer: Medicare Other | Admitting: Urology

## 2018-11-03 ENCOUNTER — Other Ambulatory Visit: Payer: Self-pay

## 2018-11-03 ENCOUNTER — Ambulatory Visit
Admission: RE | Admit: 2018-11-03 | Discharge: 2018-11-03 | Disposition: A | Discharge status: Home / Self Care | Payer: Medicare Other | Source: Ambulatory Visit | Attending: Internal Medicine | Admitting: Internal Medicine

## 2018-11-03 ENCOUNTER — Other Ambulatory Visit: Payer: Self-pay | Admitting: Internal Medicine

## 2018-11-03 DIAGNOSIS — R42 Dizziness and giddiness: Secondary | ICD-10-CM

## 2018-11-03 DIAGNOSIS — R519 Headache, unspecified: Secondary | ICD-10-CM

## 2018-11-04 ENCOUNTER — Other Ambulatory Visit: Payer: Self-pay | Admitting: Internal Medicine

## 2018-11-04 DIAGNOSIS — R519 Headache, unspecified: Secondary | ICD-10-CM

## 2018-11-04 DIAGNOSIS — R42 Dizziness and giddiness: Secondary | ICD-10-CM

## 2019-01-16 DEATH — deceased

## 2019-05-26 IMAGING — MR MR MRA HEAD W/O CM
10 of 12 series · 31 of 48 positions shown · non-contrast
Comparison: Head CT 08/21/2017

CLINICAL DATA: Stroke follow-up.  Left-sided weakness.

EXAM:
MRI HEAD WITHOUT CONTRAST
MRA HEAD WITHOUT CONTRAST
TECHNIQUE: Multiplanar, multiecho pulse sequences of the brain and surrounding
structures were obtained without intravenous contrast. Angiographic
images of the head were obtained using MRA technique without
contrast.

[Series 5: DWI · axial · 4.0mm · 0.88mm/px · z∈[-73,+59]mm · 4 of 72 slices shown (1 of 4)]
[im 1/72]
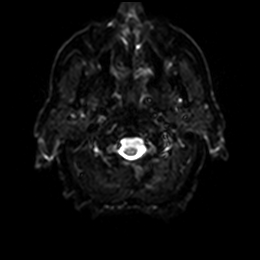
[im 24/72]
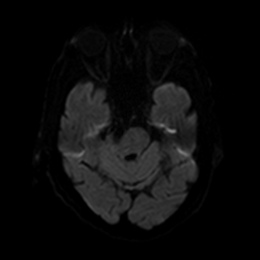
[im 48/72]
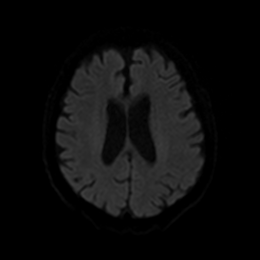
[im 72/72]
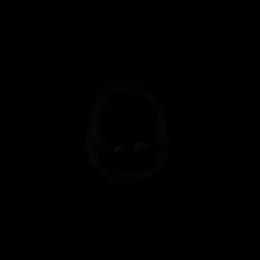

[Series 6: DWI · axial · 4.0mm · 0.88mm/px · z∈[-73,+59]mm · 3 of 36 slices shown (2 of 4)]
[im 1/36]
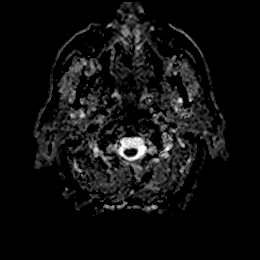
[im 18/36]
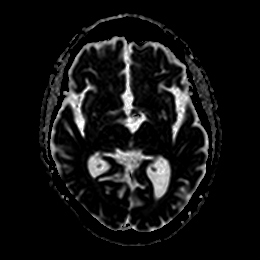
[im 36/36]
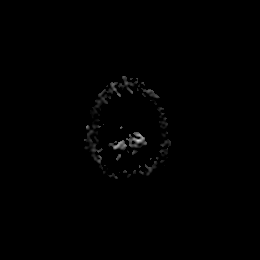

[Series 11: DWI · coronal · 4.0mm · 0.88mm/px · 5 of 72 slices shown (3 of 4)]
[im 1/72]
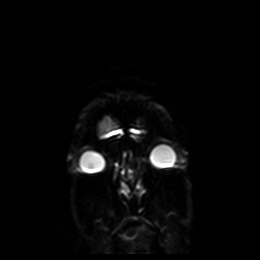
[im 18/72]
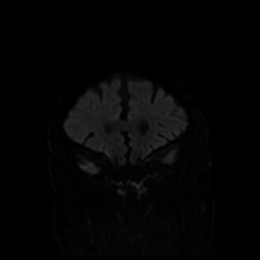
[im 36/72]
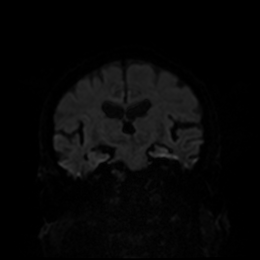
[im 54/72]
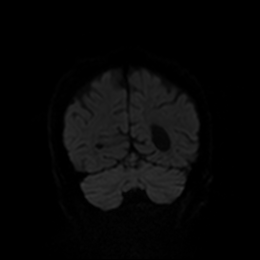
[im 72/72]
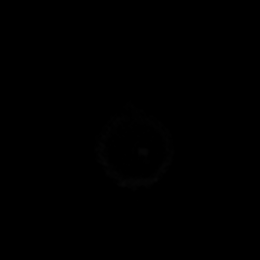

[Series 12: DWI · coronal · 4.0mm · 0.88mm/px · 3 of 36 slices shown (4 of 4)]
[im 1/36]
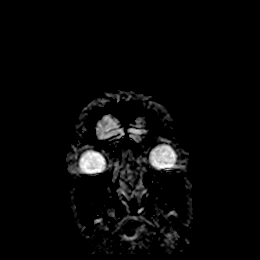
[im 18/36]
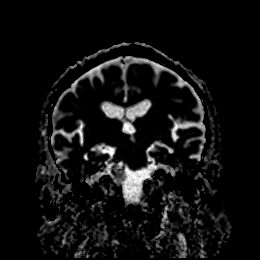
[im 36/36]
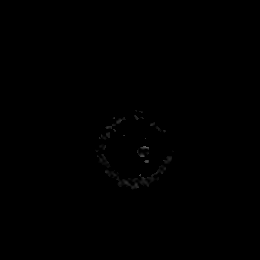

[Series 13: T1 · sagittal · 5.0mm · 0.75mm/px · 2 of 23 slices shown]
[im 1/23]
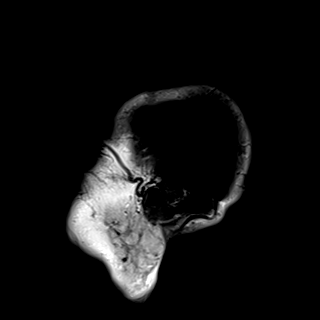
[im 23/23]
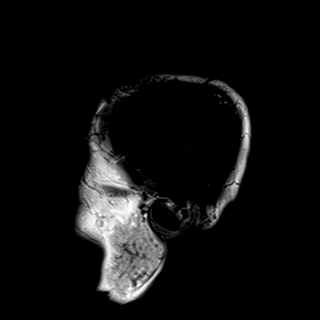

[Series 14: T2 · axial · 5.0mm · 0.72mm/px · z∈[-76,+60]mm · 2 of 25 slices shown (1 of 2)]
[im 1/25]
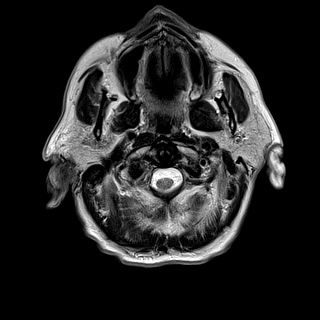
[im 25/25]
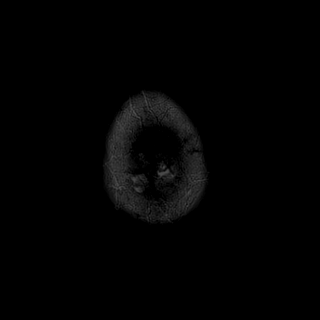

[Series 15: FLAIR · axial · 5.0mm · 0.45mm/px · z∈[-76,+60]mm · 2 of 25 slices shown]
[im 1/25]
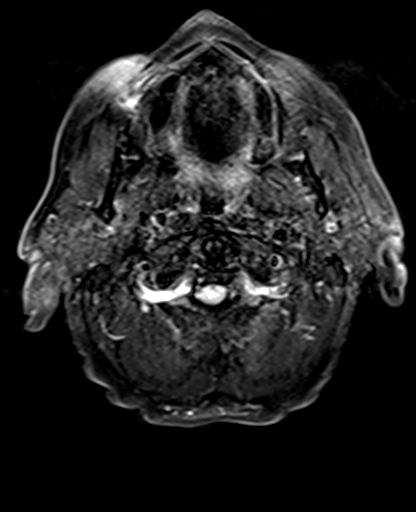
[im 25/25]
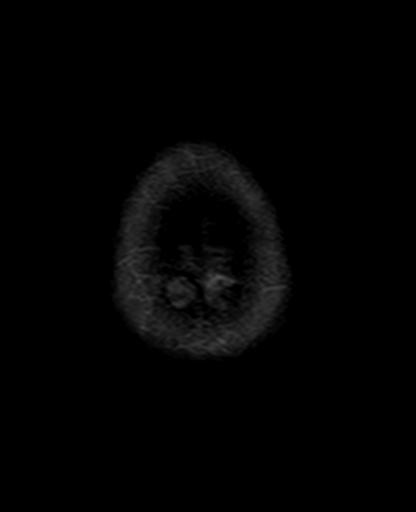

[Series 16: swi_images · axial · 3.0mm · 0.90mm/px · z∈[-86,+81]mm · 4 of 60 slices shown]
[im 1/60]
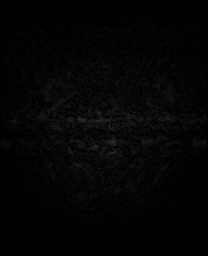
[im 20/60]
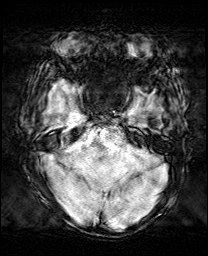
[im 40/60]
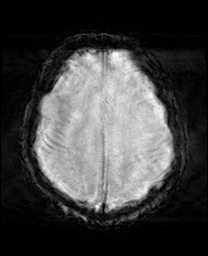
[im 60/60]
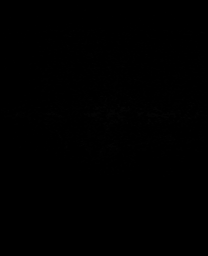

[Series 17: mip_images(sw) · axial · 24.0mm · 0.90mm/px · z∈[-76,+71]mm · 4 of 53 slices shown]
[im 1/53]
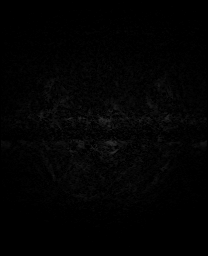
[im 18/53]
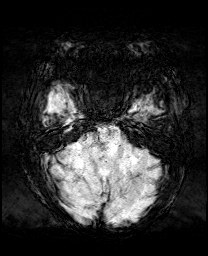
[im 35/53]
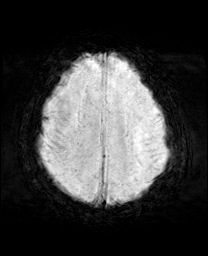
[im 53/53]
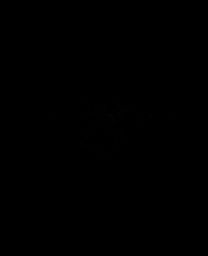

[Series 19: T2 · coronal · 5.0mm · 0.72mm/px · 2 of 29 slices shown (2 of 2)]
[im 1/29]
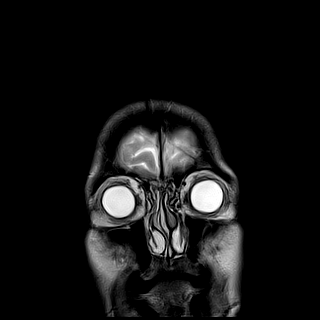
[im 29/29]
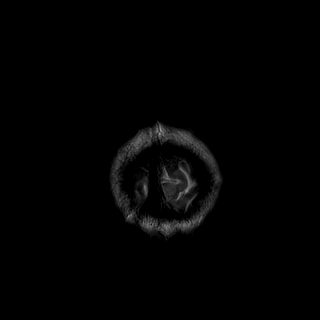

[31 of 48 positions shown; findings below may reference images not displayed]

FINDINGS: MRI HEAD FINDINGS

BRAIN: The midline structures are normal. Small area of diffusion
restriction at the site of the previously identified right medullary
infarct. No new site of ischemia. Old infarcts of the right frontal
lobe and left cerebellum. Generalized atrophy without lobar
predilection. Blood-sensitive sequences show no chronic
microhemorrhage or superficial siderosis.

SKULL AND UPPER CERVICAL SPINE: The visualized skull base,
calvarium, upper cervical spine and extracranial soft tissues are
normal.

SINUSES/ORBITS: No fluid levels or advanced mucosal thickening. No
mastoid or middle ear effusion. The orbits are normal.

MRA HEAD FINDINGS

Intracranial internal carotid arteries: Moderate-to-severe stenosis
of the petrous segment of the right internal carotid artery. There
is generalized dilatation of both internal carotid arteries at the
skull base.

Anterior cerebral arteries: Normal.

Middle cerebral arteries: Normal.

Posterior communicating arteries: Present bilaterally.

Posterior cerebral arteries: Normal.

Basilar artery: Partially thrombosed aneurysm of the vertebrobasilar
junction is unchanged in size, measuring 1.3 cm

Vertebral arteries: Codominant the partially thrombosed fusiform
aneurysm of the proximal basilar artery extends along the left
vertebral artery, measuring up to 13 mm.

Superior cerebellar arteries: Normal.

Anterior inferior cerebellar arteries: Normal.

Posterior inferior cerebellar arteries: Normal.
IMPRESSION: 1. Redemonstration of now subacute right medullary infarct without
hemorrhage or mass effect.
2. Unchanged appearance of partially thrombosed fusiform aneurysm at
the vertebrobasilar junction.
3. Moderate-to-severe stenosis of the petrous segments of the right
internal carotid artery is better demonstrated on the current study
due to the lack of motion artifact. This measures approximately 67%.

## 2019-05-26 IMAGING — CT CT HEAD CODE STROKE
3 series · 15 of 47 positions shown, 18 images · non-contrast
Comparison: 08/14/2017

CLINICAL DATA: Code stroke.  Stroke follow-up

EXAM:
CT HEAD WITHOUT CONTRAST
TECHNIQUE: Contiguous axial images were obtained from the base of the skull
through the vertex without intravenous contrast.

[Series 3: head 5.0 st · axial · 0.44mm/px · z∈[-114,+26]mm · 9 of 34 slices shown, 12 images]
[im 3/34  brain]
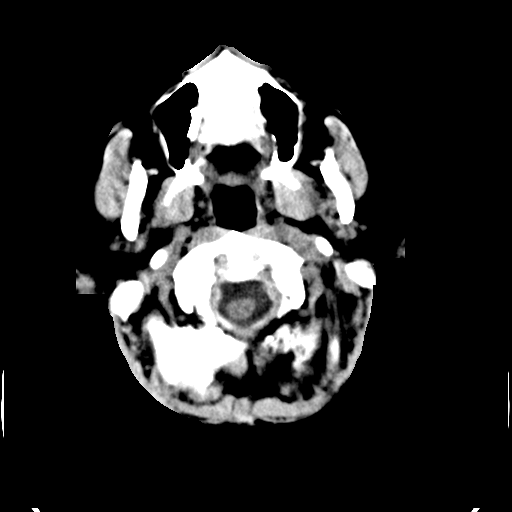
[im 3/34  bone]
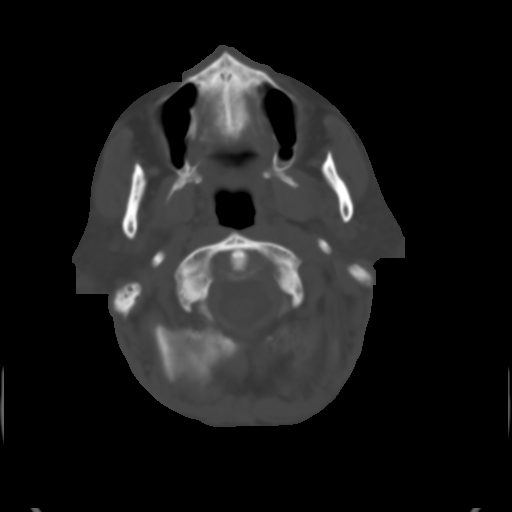
[im 6/34  brain]
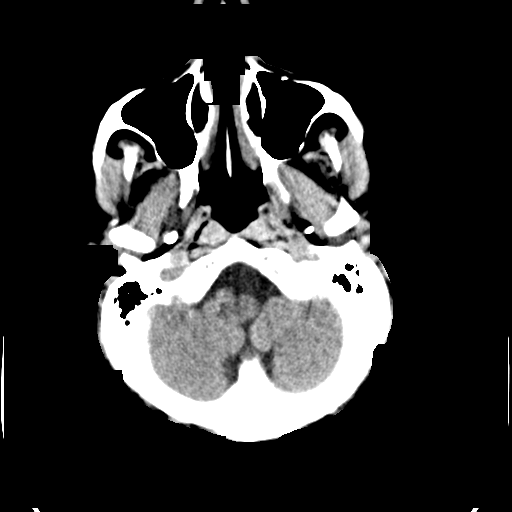
[im 10/34  brain]
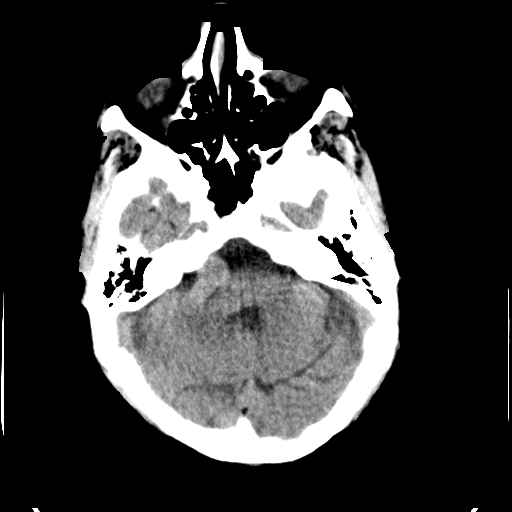
[im 13/34  brain]
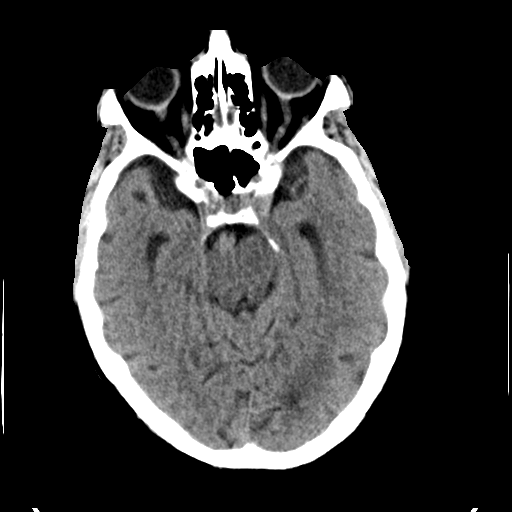
[im 18/34  brain]
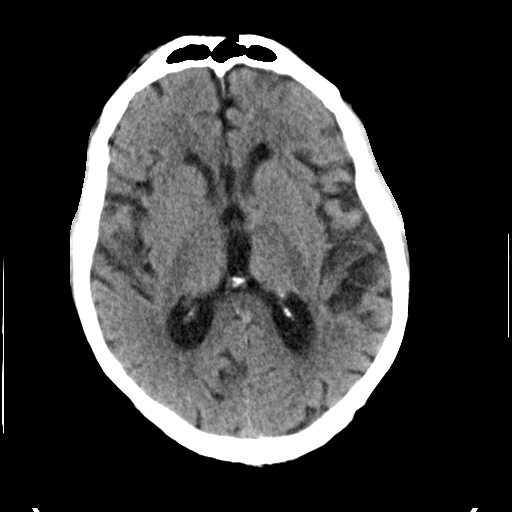
[im 18/34  bone]
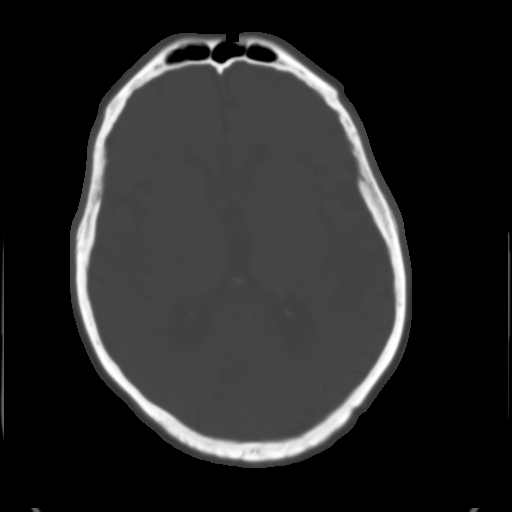
[im 21/34  brain]
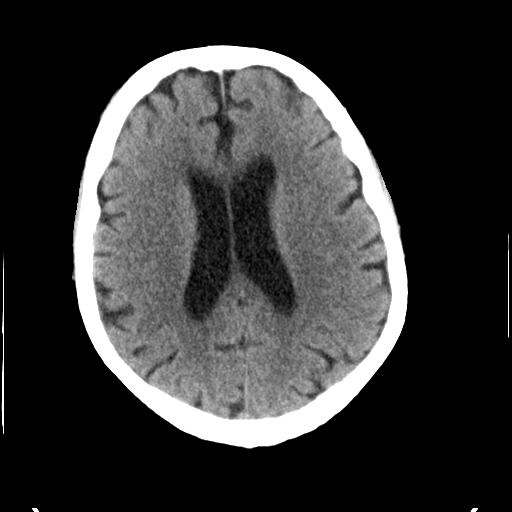
[im 24/34  brain]
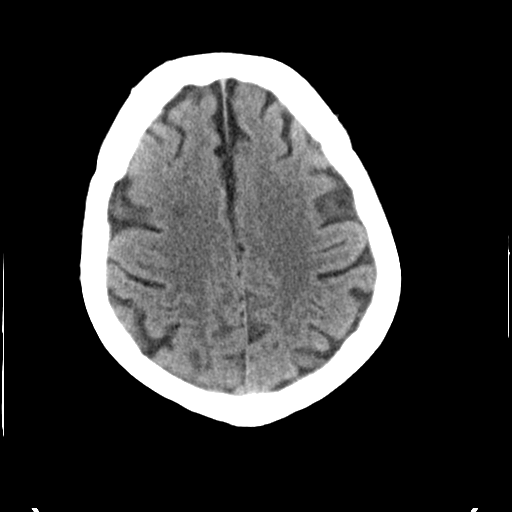
[im 28/34  brain]
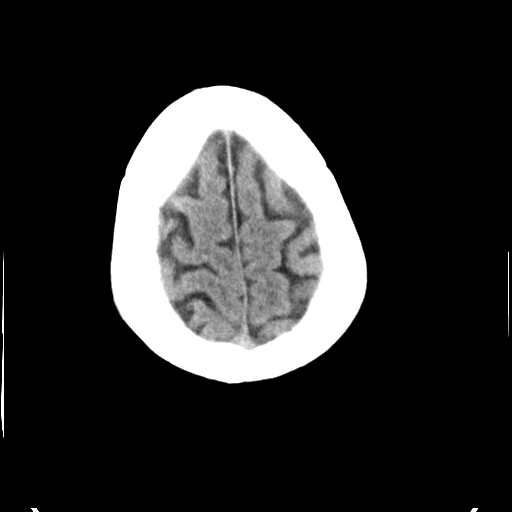
[im 31/34  brain]
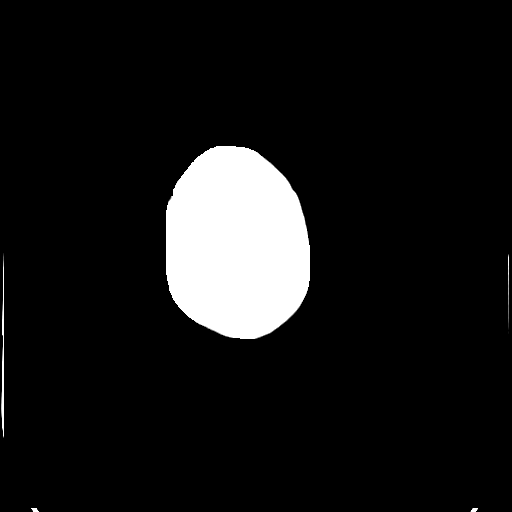
[im 31/34  bone]
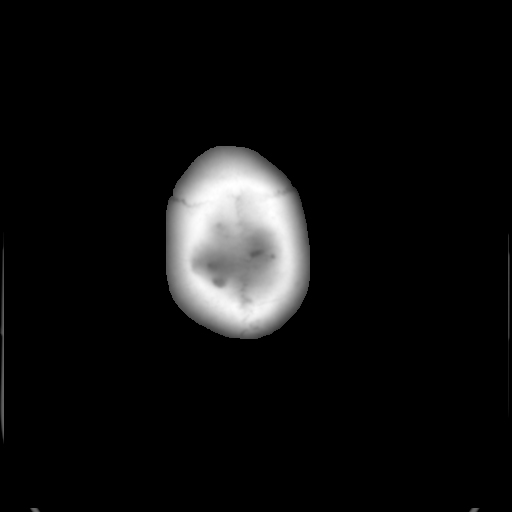

[Series 5: head 3.0 cor st · coronal · 0.34mm/px · 3 of 73 slices shown]
[im 25/73  brain]
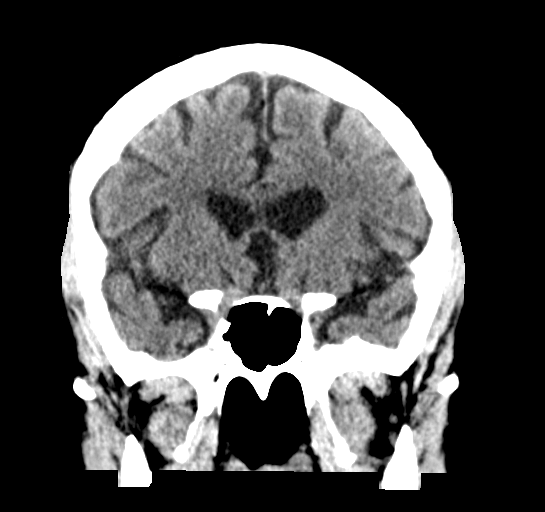
[im 33/73  brain]
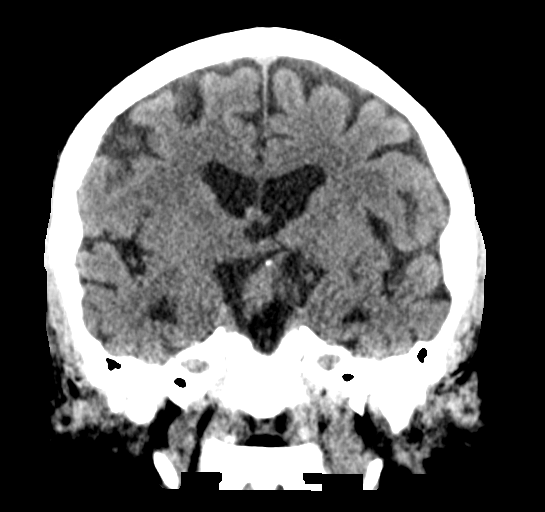
[im 41/73  brain]
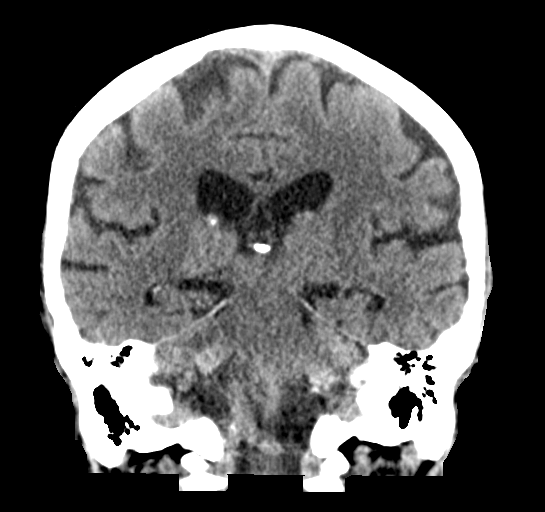

[Series 6: head 3.0 sag st · sagittal · 0.35mm/px · 3 of 59 slices shown]
[im 20/59  brain]
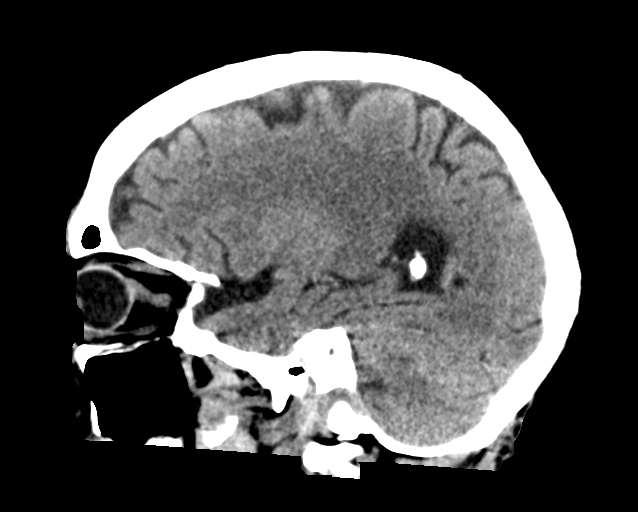
[im 30/59  brain]
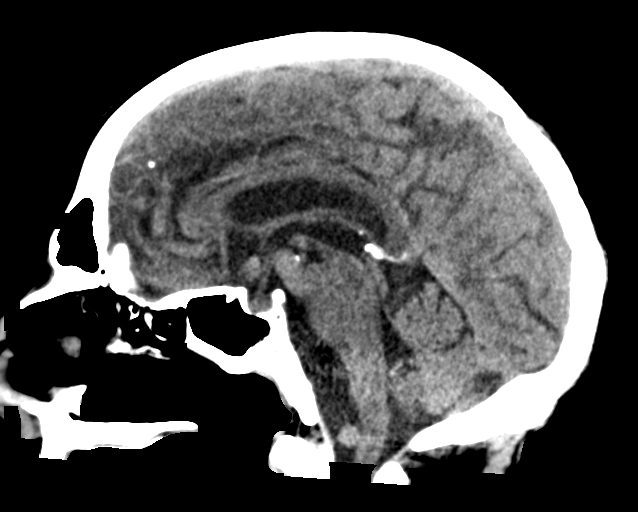
[im 39/59  brain]
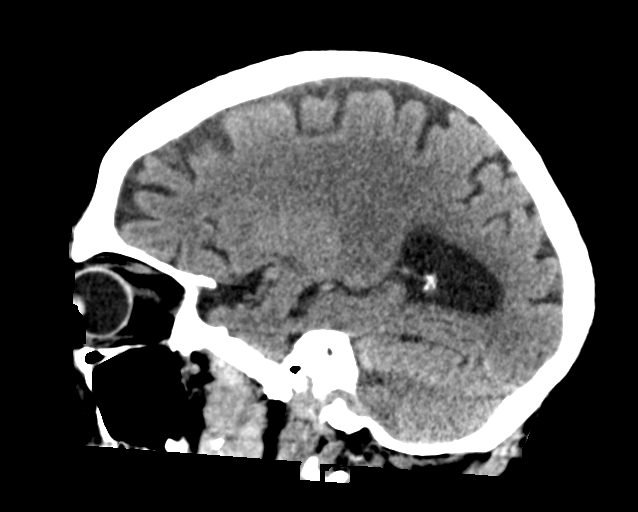

[15 of 47 positions shown; findings below may reference images not displayed]

FINDINGS: Brain: No evidence of acute infarction, hemorrhage, hydrocephalus,
extra-axial collection or mass lesion/mass effect. Subtle
low-density appearance in the left thalamus on axial slices is not
confirmed on sagittal images. Known recent right medulla infarct
without evident progression.

Vascular: Vertebrobasilar dolichoectasia. The supraclinoid ICAs are
also ectatic appearing. The basilar is partially thrombosed MRI. No
hyperdensity of the vessel to suggest complete thrombosis.

Skull: Normal. Negative for fracture or focal lesion.

Sinuses/Orbits: Negative

Other: These results were communicated to Dr. Strom at [DATE]
pmon 08/21/2017by text page via the [employer] messaging system.

ASPECTS ([place] Stroke Program Early CT Score)

Not scored with this history
IMPRESSION: 1. No acute finding.
2. Recent right medullary infarct by MRI.
3. Vertebrobasilar dolichoectasia.
